# Patient Record
Sex: Male | Born: 1985 | Race: White | Hispanic: No | Marital: Single | Smoking: Never smoker
Health system: Southern US, Community
[De-identification: ages and names within clinical notes are randomized; demographics above are authoritative.]

## PROBLEM LIST (undated history)

## (undated) ENCOUNTER — Emergency Department (HOSPITAL_COMMUNITY): Admission: EM | Payer: BLUE CROSS/BLUE SHIELD | Source: Home / Self Care

## (undated) DIAGNOSIS — K5792 Diverticulitis of intestine, part unspecified, without perforation or abscess without bleeding: Secondary | ICD-10-CM

## (undated) DIAGNOSIS — E119 Type 2 diabetes mellitus without complications: Secondary | ICD-10-CM

## (undated) DIAGNOSIS — T7840XA Allergy, unspecified, initial encounter: Secondary | ICD-10-CM

## (undated) DIAGNOSIS — K760 Fatty (change of) liver, not elsewhere classified: Secondary | ICD-10-CM

## (undated) DIAGNOSIS — K5790 Diverticulosis of intestine, part unspecified, without perforation or abscess without bleeding: Secondary | ICD-10-CM

## (undated) DIAGNOSIS — Q433 Congenital malformations of intestinal fixation: Secondary | ICD-10-CM

## (undated) DIAGNOSIS — J45909 Unspecified asthma, uncomplicated: Secondary | ICD-10-CM

## (undated) DIAGNOSIS — K859 Acute pancreatitis without necrosis or infection, unspecified: Secondary | ICD-10-CM

## (undated) HISTORY — PX: OTHER SURGICAL HISTORY: SHX169

## (undated) HISTORY — DX: Allergy, unspecified, initial encounter: T78.40XA

## (undated) HISTORY — PX: EYE SURGERY: SHX253

## (undated) HISTORY — DX: Diverticulitis of intestine, part unspecified, without perforation or abscess without bleeding: K57.92

## (undated) HISTORY — DX: Fatty (change of) liver, not elsewhere classified: K76.0

## (undated) HISTORY — DX: Diverticulosis of intestine, part unspecified, without perforation or abscess without bleeding: K57.90

## (undated) HISTORY — DX: Congenital malformations of intestinal fixation: Q43.3

## (undated) HISTORY — DX: Unspecified asthma, uncomplicated: J45.909

## (undated) HISTORY — PX: APPENDECTOMY: SHX54

---

## 2011-10-05 ENCOUNTER — Emergency Department (HOSPITAL_COMMUNITY)
Admission: EM | Admit: 2011-10-05 | Discharge: 2011-10-05 | Disposition: A | Discharge status: Home / Self Care | Payer: Self-pay | Attending: Emergency Medicine | Admitting: Emergency Medicine

## 2011-10-05 ENCOUNTER — Emergency Department (HOSPITAL_COMMUNITY): Payer: Self-pay

## 2011-10-05 ENCOUNTER — Encounter (HOSPITAL_COMMUNITY): Payer: Self-pay | Admitting: *Deleted

## 2011-10-05 DIAGNOSIS — M7989 Other specified soft tissue disorders: Secondary | ICD-10-CM | POA: Insufficient documentation

## 2011-10-05 DIAGNOSIS — S93402A Sprain of unspecified ligament of left ankle, initial encounter: Secondary | ICD-10-CM

## 2011-10-05 DIAGNOSIS — Y92009 Unspecified place in unspecified non-institutional (private) residence as the place of occurrence of the external cause: Secondary | ICD-10-CM | POA: Insufficient documentation

## 2011-10-05 DIAGNOSIS — S93409A Sprain of unspecified ligament of unspecified ankle, initial encounter: Secondary | ICD-10-CM | POA: Insufficient documentation

## 2011-10-05 DIAGNOSIS — W19XXXA Unspecified fall, initial encounter: Secondary | ICD-10-CM | POA: Insufficient documentation

## 2011-10-05 IMAGING — CR DG ANKLE COMPLETE 3+V*L*
3 series · 3 of 3 positions shown · non-contrast
Comparison: None.

CLINICAL DATA: Twisting left ankle injury.  Fall.  Lateral pain.

LEFT ANKLE COMPLETE - 3+ VIEW

[view not recorded (1 of 3)]
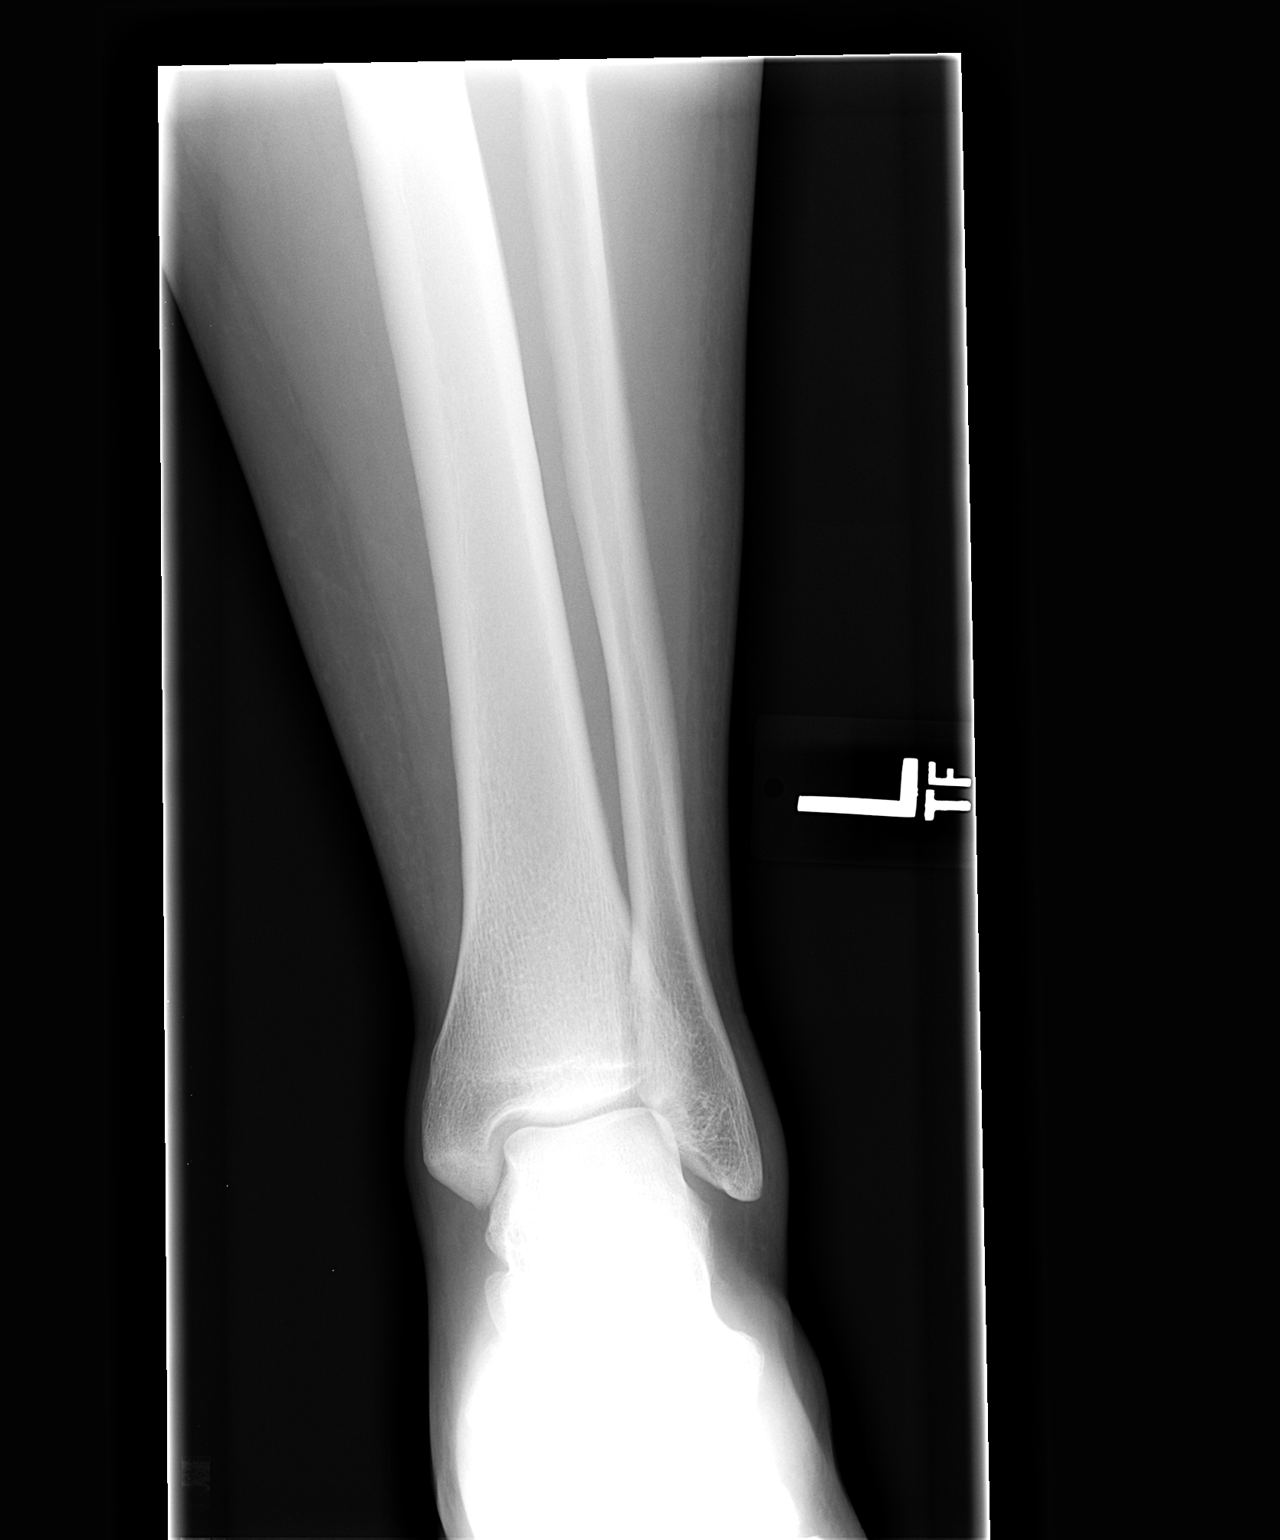

[view not recorded (2 of 3)]
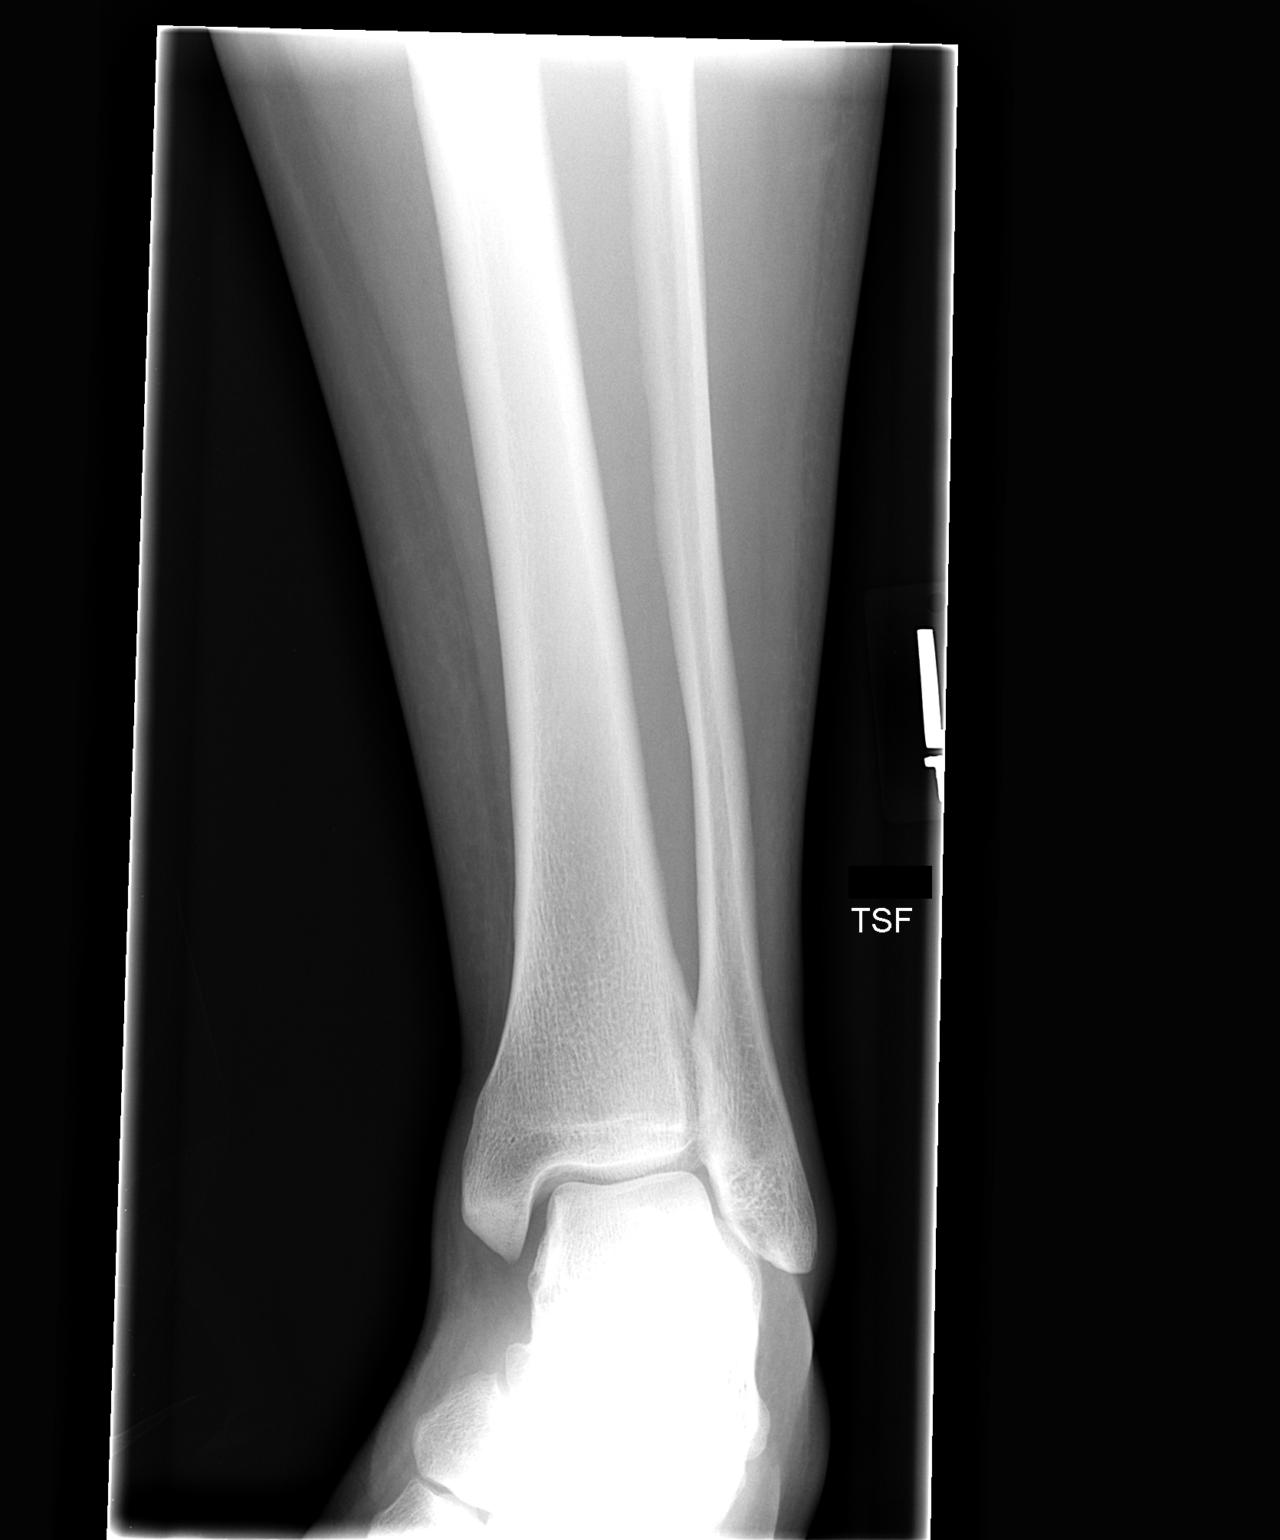

[view not recorded (3 of 3)]
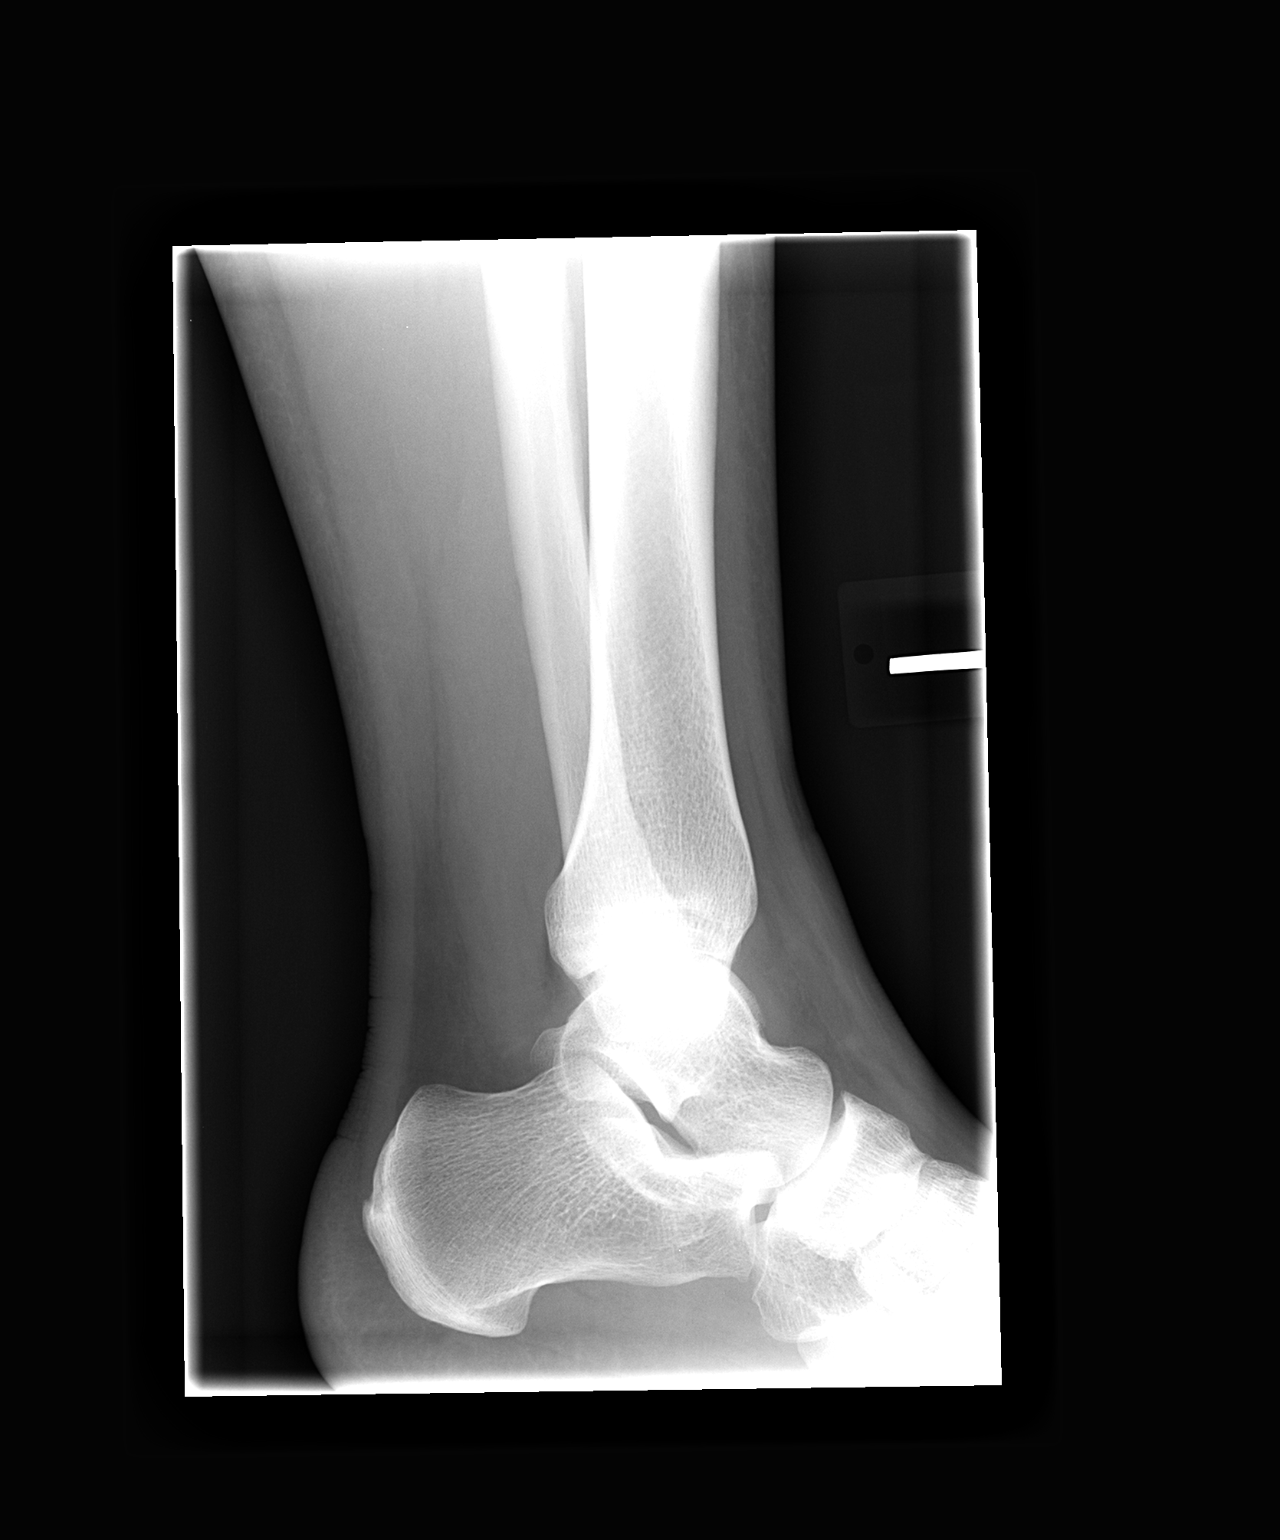

[3 of 3 positions shown; findings below may reference images not displayed]

FINDINGS: Mild soft tissue swelling overlies the lateral malleolus.
No malleolar fracture noted.  The plafond and talar dome appear
intact.
IMPRESSION: 1.  Mild lateral soft tissue swelling without observe fracture.  If
there is a high clinical index of occult injury, CT or MRI may be
helpful.

## 2011-10-05 MED ORDER — HYDROCODONE-ACETAMINOPHEN 5-325 MG PO TABS
2.0000 | ORAL_TABLET | Freq: Once | ORAL | Status: AC
  Administered 2011-10-05: 2 via ORAL
  Filled 2011-10-05: qty 2

## 2011-10-05 MED ORDER — PROMETHAZINE HCL 12.5 MG PO TABS
12.5000 mg | ORAL_TABLET | Freq: Once | ORAL | Status: AC
  Administered 2011-10-05: 12.5 mg via ORAL
  Filled 2011-10-05: qty 1

## 2011-10-05 MED ORDER — IBUPROFEN 800 MG PO TABS
800.0000 mg | ORAL_TABLET | Freq: Once | ORAL | Status: AC
  Administered 2011-10-05: 800 mg via ORAL
  Filled 2011-10-05: qty 1

## 2011-10-05 MED ORDER — HYDROCODONE-ACETAMINOPHEN 7.5-325 MG PO TABS: 1.0000 | ORAL_TABLET | ORAL | Status: AC | PRN

## 2011-10-05 MED ORDER — IBUPROFEN 800 MG PO TABS: 800.0000 mg | ORAL_TABLET | Freq: Three times a day (TID) | ORAL | Status: AC

## 2011-10-05 NOTE — ED Provider Notes (Signed)
History     CSN: [id number]  Arrival date & time 10/05/11  1956   None     Chief Complaint  Patient presents with  . Ankle Injury    (Consider location/radiation/quality/duration/timing/severity/associated sxs/prior treatment) Patient is a 26 y.o. male presenting with lower extremity injury. The history is provided by the patient.  Ankle Injury This is a new problem. The current episode started today. The problem occurs constantly. The problem has been gradually worsening. Associated symptoms include joint swelling. Pertinent negatives include no abdominal pain, arthralgias, chest pain, coughing or neck pain. The symptoms are aggravated by standing. He has tried nothing for the symptoms. The treatment provided no relief.    History reviewed. No pertinent past medical history.  Past Surgical History  Procedure Date  . Cranial surgery     No family history on file.  History  Substance Use Topics  . Smoking status: Unknown If Ever Smoked  . Smokeless tobacco: Not on file  . Alcohol Use: Yes     occassional      Review of Systems  Constitutional: Negative for activity change.       All ROS Neg except as noted in HPI  HENT: Negative for nosebleeds and neck pain.   Eyes: Negative for photophobia and discharge.  Respiratory: Negative for cough, shortness of breath and wheezing.   Cardiovascular: Negative for chest pain and palpitations.  Gastrointestinal: Negative for abdominal pain and blood in stool.  Genitourinary: Negative for dysuria, frequency and hematuria.  Musculoskeletal: Positive for joint swelling. Negative for back pain and arthralgias.  Skin: Negative.   Neurological: Negative for dizziness, seizures and speech difficulty.  Psychiatric/Behavioral: Negative for hallucinations and confusion.    Allergies  Poison sumac extract  Home Medications   Current Outpatient Rx  Name Route Sig Dispense Refill  . IBUPROFEN 200 MG PO TABS Oral Take 400 mg by mouth  as needed. For headache pain      BP 132/71  Pulse 91  Temp(Src) 97.9 F (36.6 C) (Oral)  Resp 20  Ht 5\' 11"  (1.803 m)  Wt 235 lb (106.595 kg)  BMI 32.78 kg/m2  SpO2 97%  Physical Exam  Nursing note and vitals reviewed. Constitutional: He is oriented to person, place, and time. He appears well-developed and well-nourished.  Non-toxic appearance.  HENT:  Head: Normocephalic.  Right Ear: Tympanic membrane and external ear normal.  Left Ear: Tympanic membrane and external ear normal.  Eyes: EOM and lids are normal. Pupils are equal, round, and reactive to light.  Neck: Normal range of motion. Neck supple. Carotid bruit is not present.  Cardiovascular: Normal rate, regular rhythm, normal heart sounds, intact distal pulses and normal pulses.   Pulmonary/Chest: Breath sounds normal. No respiratory distress.  Abdominal: Soft. Bowel sounds are normal. There is no tenderness. There is no guarding.  Musculoskeletal: Normal range of motion.       There is good range of motion of the left hip and knee. There is soreness of the lower left tibial area. There is swelling and pain of the left lateral malleolus. The Achilles tendon is intact. There is good capillary refill. Good dorsalis pedis pulses are symmetrical.  Lymphadenopathy:       Head (right side): No submandibular adenopathy present.       Head (left side): No submandibular adenopathy present.    He has no cervical adenopathy.  Neurological: He is alert and oriented to person, place, and time. He has normal strength. No cranial nerve  deficit or sensory deficit.  Skin: Skin is warm and dry.  Psychiatric: He has a normal mood and affect. His speech is normal.    ED Course  Procedures (including critical care time) Pulse oximetry 97% on room air. Within normal limits by my interpretation. Labs Reviewed - No data to display Dg Ankle Complete Left  10/05/2011  *RADIOLOGY REPORT*  Clinical Data: Twisting left ankle injury.  Fall.   Lateral pain.  LEFT ANKLE COMPLETE - 3+ VIEW  Comparison: None.  Findings: Mild soft tissue swelling overlies the lateral malleolus. No malleolar fracture noted.  The plafond and talar dome appear intact.  IMPRESSION:  1.  Mild lateral soft tissue swelling without observe fracture.  If there is a high clinical index of occult injury, CT or MRI may be helpful.  Original Report Authenticated By: Dellia Cloud, M.D.     No diagnosis found.    MDM  I have reviewed nursing notes, vital signs, and all appropriate lab and imaging results for this patient. Patient sustained a fall outside of his home today. States he thought he heard something pop. The x-ray of the left ankle is negative for fracture or dislocation. There is some soft tissue swelling of the lateral malleolus. The patient is advised to use an ice pack and elevation over the next few days. He is fitted with an ankle splint. He is fitted with crutches, and advised to use the crutches until he can safely bear weight on the left ankle. Prescription for ibuprofen 800 mg one 3 times daily with food, and Norco 7.5 mg one every 4 hours as needed for pain given to the patient. The patient is to see orthopedics if not improving.       Kathie Dike, [place] 10/05/11 2125

## 2011-10-05 NOTE — Discharge Instructions (Signed)
Your x-rays are negative for fracture or dislocation at this time. Please apply ice, and keep the left ankle elevated above her waist. Please use crutches when up and about, and use crutches until you can safely apply weight to the left ankle. Please use the ankle splint for the next 10-14 days, when up and about. Please do not use the splint when sleeping. Please see the orthopedic MD listed above or the orthopedic MD of your choice if not improving.Ankle Sprain An ankle sprain is an injury to the strong, fibrous tissues (ligaments) that hold the bones of your ankle joint together.  CAUSES Ankle sprain usually is caused by a fall or by twisting your ankle. People who participate in sports are more prone to these types of injuries.  SYMPTOMS  Symptoms of ankle sprain include:  Pain in your ankle. The pain may be present at rest or only when you are trying to stand or walk.   Swelling.   Bruising. Bruising may develop immediately or within 1 to 2 days after your injury.   Difficulty standing or walking.  DIAGNOSIS  Your caregiver will ask you details about your injury and perform a physical exam of your ankle to determine if you have an ankle sprain. During the physical exam, your caregiver will press and squeeze specific areas of your foot and ankle. Your caregiver will try to move your ankle in certain ways. An X-ray exam may be done to be sure a bone was not broken or a ligament did not separate from one of the bones in your ankle (avulsion).  TREATMENT  Certain types of braces can help stabilize your ankle. Your caregiver can make a recommendation for this. Your caregiver may recommend the use of medication for pain. If your sprain is severe, your caregiver may refer you to a surgeon who helps to restore function to parts of your skeletal system (orthopedist) or a physical therapist. HOME CARE INSTRUCTIONS  Apply ice to your injury for 1 to 2 days or as directed by your caregiver. Applying ice  helps to reduce inflammation and pain.  Put ice in a plastic bag.   Place a towel between your skin and the bag.   Leave the ice on for 15 to 20 minutes at a time, every 2 hours while you are awake.   Take over-the-counter or prescription medicines for pain, discomfort, or fever only as directed by your caregiver.   Keep your injured leg elevated, when possible, to lessen swelling.   If your caregiver recommends crutches, use them as instructed. Gradually, put weight on the affected ankle. Continue to use crutches or a cane until you can walk without feeling pain in your ankle.   If you have a plaster splint, wear the splint as directed by your caregiver. Do not rest it on anything harder than a pillow the first 24 hours. Do not put weight on it. Do not get it wet. You may take it off to take a shower or bath.   You may have been given an elastic bandage to wear around your ankle to provide support. If the elastic bandage is too tight (you have numbness or tingling in your foot or your foot becomes cold and blue), adjust the bandage to make it comfortable.   If you have an air splint, you may blow more air into it or let air out to make it more comfortable. You may take your splint off at night and before taking a shower  or bath.   Wiggle your toes in the splint several times per day if you are able.  SEEK MEDICAL CARE IF:   You have an increase in bruising, swelling, or pain.   Your toes feel cold.   Pain relief is not achieved with medication.  SEEK IMMEDIATE MEDICAL CARE IF: Your toes are numb or blue or you have severe pain. MAKE SURE YOU:   Understand these instructions.   Will watch your condition.   Will get help right away if you are not doing well or get worse.  Document Released: 07/06/2005 Document Revised: 06/25/2011 Document Reviewed: 02/08/2008 [facility] Patient Information 2012 [place], [place].

## 2011-10-05 NOTE — ED Notes (Signed)
Pain in left ankle, fell felt something pop

## 2011-10-05 NOTE — ED Notes (Addendum)
Pain lt ankle, Fell app 730p on wet concrete.  Swelling of lat malleolus.  Good DP pulse.  Pt does not want to place on pillow, says that it makes it hurt worse.  Ice pack applied.

## 2011-10-09 NOTE — ED Provider Notes (Signed)
Medical screening examination/treatment/procedure(s) were performed by non-physician practitioner and as supervising physician I was immediately available for consultation/collaboration.   Shelda Jakes, MD 10/09/11 [phone]

## 2013-08-03 ENCOUNTER — Emergency Department (HOSPITAL_COMMUNITY): Payer: BC Managed Care – PPO

## 2013-08-03 ENCOUNTER — Ambulatory Visit (INDEPENDENT_AMBULATORY_CARE_PROVIDER_SITE_OTHER): Payer: BC Managed Care – PPO | Admitting: Family Medicine

## 2013-08-03 ENCOUNTER — Encounter: Payer: Self-pay | Admitting: Family Medicine

## 2013-08-03 ENCOUNTER — Encounter (HOSPITAL_COMMUNITY): Payer: Self-pay | Admitting: Emergency Medicine

## 2013-08-03 ENCOUNTER — Encounter (INDEPENDENT_AMBULATORY_CARE_PROVIDER_SITE_OTHER): Payer: Self-pay

## 2013-08-03 ENCOUNTER — Inpatient Hospital Stay (HOSPITAL_COMMUNITY)
Admission: EM | Admit: 2013-08-03 | Discharge: 2013-08-05 | DRG: 392 | Disposition: A | Discharge status: Home / Self Care | Payer: BC Managed Care – PPO | Attending: Internal Medicine | Admitting: Internal Medicine

## 2013-08-03 VITALS — BP 156/101 | HR 132 | Temp 98.8°F | Ht 71.0 in | Wt 274.0 lb

## 2013-08-03 DIAGNOSIS — E669 Obesity, unspecified: Secondary | ICD-10-CM | POA: Diagnosis present

## 2013-08-03 DIAGNOSIS — R109 Unspecified abdominal pain: Secondary | ICD-10-CM

## 2013-08-03 DIAGNOSIS — R1032 Left lower quadrant pain: Secondary | ICD-10-CM | POA: Diagnosis present

## 2013-08-03 DIAGNOSIS — R52 Pain, unspecified: Secondary | ICD-10-CM

## 2013-08-03 DIAGNOSIS — I498 Other specified cardiac arrhythmias: Secondary | ICD-10-CM | POA: Diagnosis present

## 2013-08-03 DIAGNOSIS — Z6838 Body mass index (BMI) 38.0-38.9, adult: Secondary | ICD-10-CM

## 2013-08-03 DIAGNOSIS — Q433 Congenital malformations of intestinal fixation: Secondary | ICD-10-CM

## 2013-08-03 DIAGNOSIS — K5712 Diverticulitis of small intestine without perforation or abscess without bleeding: Principal | ICD-10-CM | POA: Diagnosis present

## 2013-08-03 DIAGNOSIS — K5732 Diverticulitis of large intestine without perforation or abscess without bleeding: Secondary | ICD-10-CM | POA: Diagnosis present

## 2013-08-03 DIAGNOSIS — R112 Nausea with vomiting, unspecified: Secondary | ICD-10-CM | POA: Diagnosis present

## 2013-08-03 DIAGNOSIS — K5792 Diverticulitis of intestine, part unspecified, without perforation or abscess without bleeding: Secondary | ICD-10-CM | POA: Diagnosis present

## 2013-08-03 DIAGNOSIS — R6510 Systemic inflammatory response syndrome (SIRS) of non-infectious origin without acute organ dysfunction: Secondary | ICD-10-CM

## 2013-08-03 DIAGNOSIS — R000 Tachycardia, unspecified: Secondary | ICD-10-CM | POA: Diagnosis present

## 2013-08-03 LAB — COMPREHENSIVE METABOLIC PANEL
ALBUMIN: 3.9 g/dL (ref 3.5–5.2)
ALK PHOS: 86 U/L (ref 39–117)
ALT: 33 U/L (ref 0–53)
AST: 24 U/L (ref 0–37)
BUN: 14 mg/dL (ref 6–23)
CALCIUM: 9.5 mg/dL (ref 8.4–10.5)
CO2: 24 mEq/L (ref 19–32)
Chloride: 100 mEq/L (ref 96–112)
Creatinine, Ser: 0.81 mg/dL (ref 0.50–1.35)
GFR calc non Af Amer: >90 mL/min (ref >90)
GLUCOSE: 94 mg/dL (ref 70–99)
POTASSIUM: 4 meq/L (ref 3.7–5.3)
SODIUM: 138 meq/L (ref 137–147)
TOTAL PROTEIN: 8.1 g/dL (ref 6.0–8.3)
Total Bilirubin: 0.4 mg/dL (ref 0.3–1.2)

## 2013-08-03 LAB — URINALYSIS, ROUTINE W REFLEX MICROSCOPIC
BILIRUBIN URINE: NEGATIVE
Bilirubin Urine: NEGATIVE
GLUCOSE, UA: NEGATIVE mg/dL
GLUCOSE, UA: NEGATIVE mg/dL
KETONES UR: NEGATIVE mg/dL
Ketones, ur: 15 mg/dL — AB
Leukocytes, UA: NEGATIVE
Leukocytes, UA: NEGATIVE
Nitrite: NEGATIVE
Nitrite: NEGATIVE
PH: 5.5 (ref 5.0–8.0)
Protein, ur: NEGATIVE mg/dL
Protein, ur: NEGATIVE mg/dL
SPECIFIC GRAVITY, URINE: 1.025 (ref 1.005–1.030)
Specific Gravity, Urine: 1.02 (ref 1.005–1.030)
Urobilinogen, UA: 0.2 mg/dL (ref 0.0–1.0)
Urobilinogen, UA: 0.2 mg/dL (ref 0.0–1.0)
pH: 5 (ref 5.0–8.0)

## 2013-08-03 LAB — CBC WITH DIFFERENTIAL/PLATELET
Basophils Absolute: 0 10*3/uL (ref 0.0–0.1)
Basophils Relative: 0 % (ref 0–1)
EOS PCT: 1 % (ref 0–5)
Eosinophils Absolute: 0.1 10*3/uL (ref 0.0–0.7)
HEMATOCRIT: 44.6 % (ref 39.0–52.0)
HEMOGLOBIN: 15.8 g/dL (ref 13.0–17.0)
LYMPHS PCT: 14 % (ref 12–46)
Lymphs Abs: 2.1 10*3/uL (ref 0.7–4.0)
MCH: 31.7 pg (ref 26.0–34.0)
MCHC: 35.4 g/dL (ref 30.0–36.0)
MCV: 89.6 fL (ref 78.0–100.0)
MONO ABS: 2.3 10*3/uL — AB (ref 0.1–1.0)
MONOS PCT: 15 % — AB (ref 3–12)
NEUTROS ABS: 10.4 10*3/uL — AB (ref 1.7–7.7)
Neutrophils Relative %: 70 % (ref 43–77)
Platelets: 253 10*3/uL (ref 150–400)
RBC: 4.98 MIL/uL (ref 4.22–5.81)
RDW: 12.9 % (ref 11.5–15.5)
WBC: 14.9 10*3/uL — AB (ref 4.0–10.5)

## 2013-08-03 LAB — URINE MICROSCOPIC-ADD ON

## 2013-08-03 LAB — LIPASE, BLOOD: Lipase: 33 U/L (ref 11–59)

## 2013-08-03 LAB — LACTIC ACID, PLASMA: Lactic Acid, Venous: 1.1 mmol/L (ref 0.5–2.2)

## 2013-08-03 IMAGING — CT CT ABD-PELV W/ CM
2 of 4 series · 15 of 46 positions shown, 17 images · IV contrast (APPLIED)
Comparison: None.

CLINICAL DATA: Left lower quadrant pain with nausea. Abdominal
distention.

EXAM:
CT ABDOMEN AND PELVIS WITH CONTRAST
TECHNIQUE: Multidetector CT imaging of the abdomen and pelvis was performed
using the standard protocol following bolus administration of
intravenous contrast.
CONTRAST:  100mL OMNIPAQUE IOHEXOL 300 MG/ML  SOLN

[Series 2: abd/ pelvis 5.0 i30f 1 · axial · 0.87mm/px · z∈[-798,-288]mm · 12 of 112 slices shown, 14 images]
[im 5/112  soft-tissue]
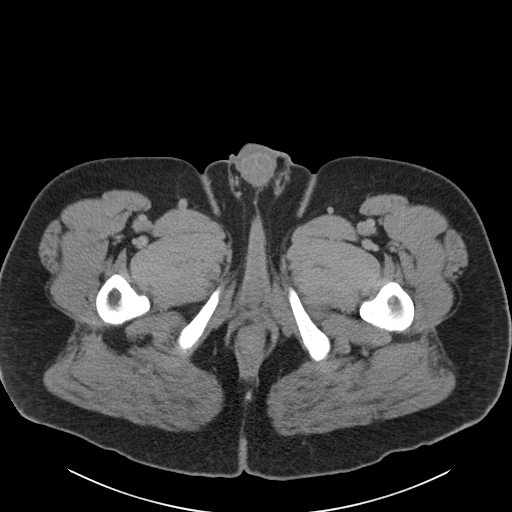
[im 5/112  bone]
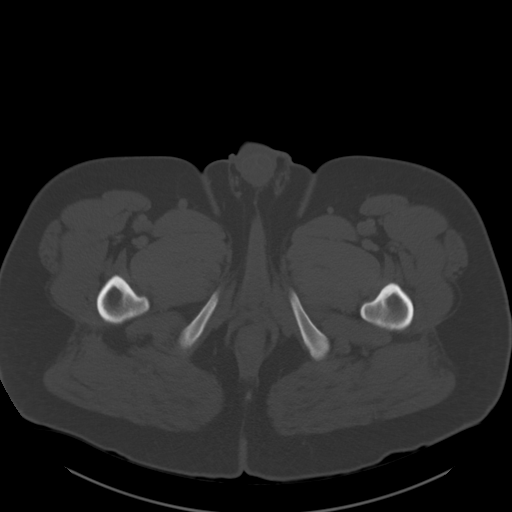
[im 14/112  soft-tissue]
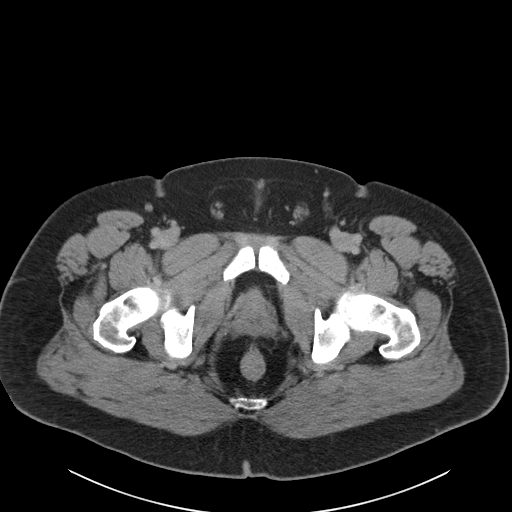
[im 24/112  soft-tissue]
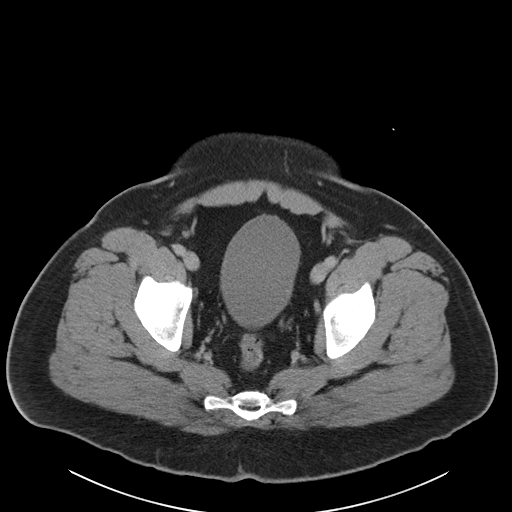
[im 33/112  soft-tissue]
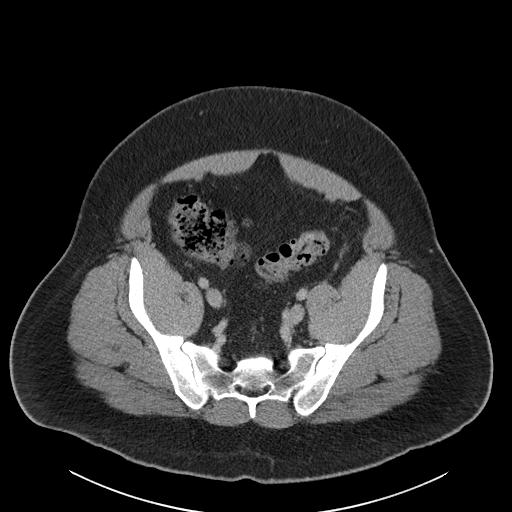
[im 42/112  soft-tissue]
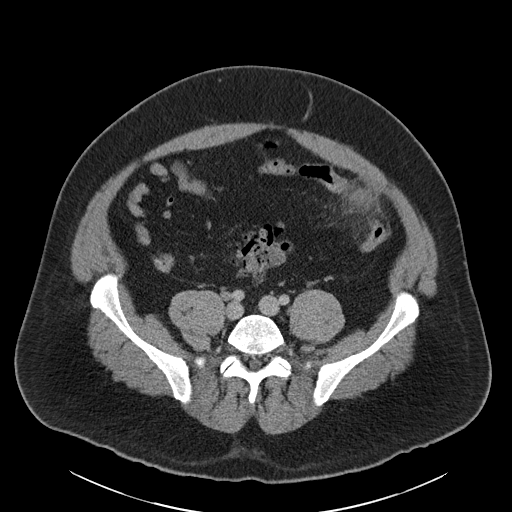
[im 51/112  soft-tissue]
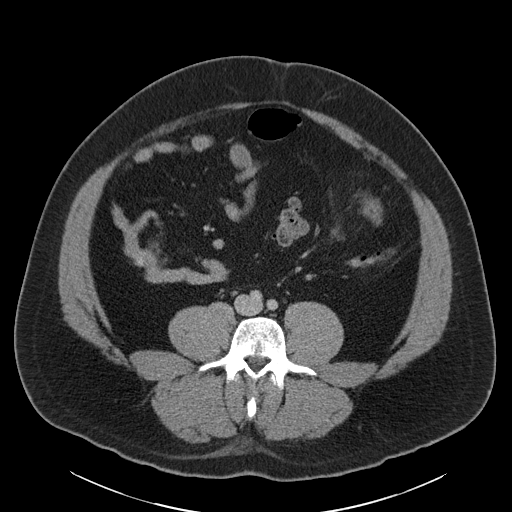
[im 61/112  soft-tissue]
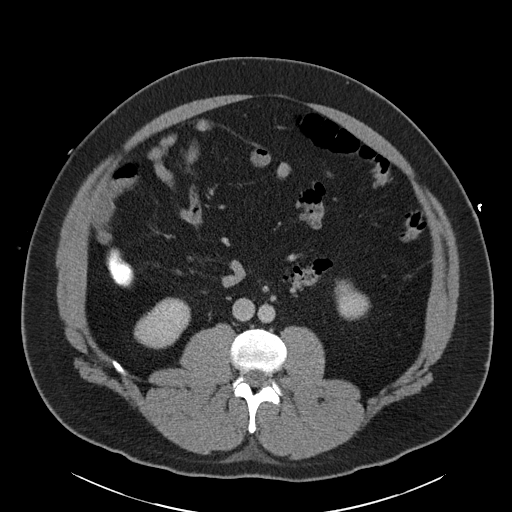
[im 70/112  soft-tissue]
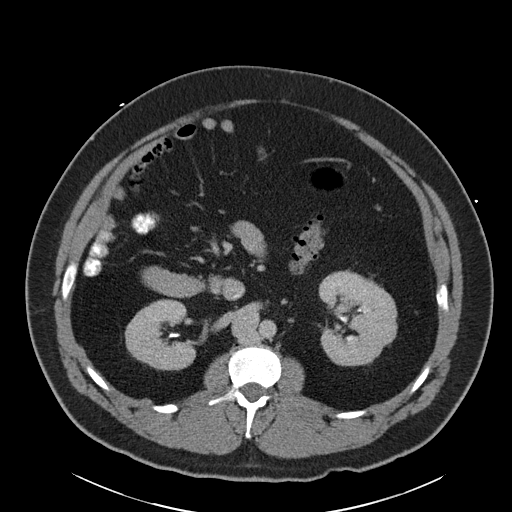
[im 79/112  soft-tissue]
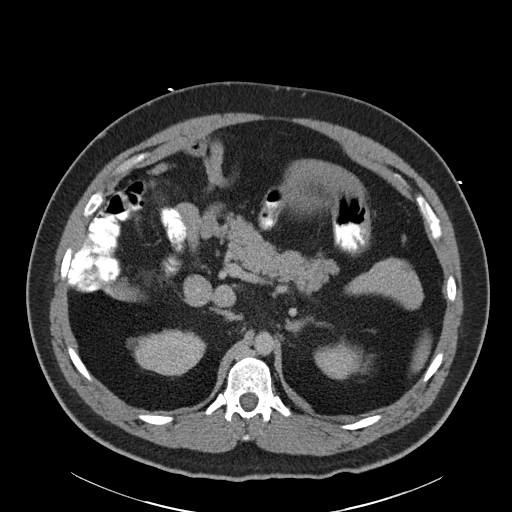
[im 79/112  bone]
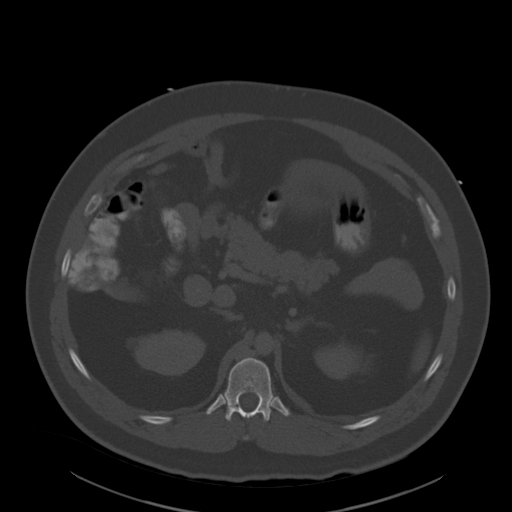
[im 88/112  soft-tissue]
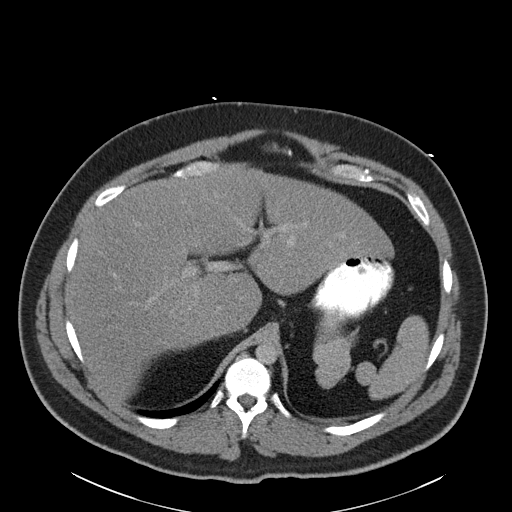
[im 98/112  soft-tissue]
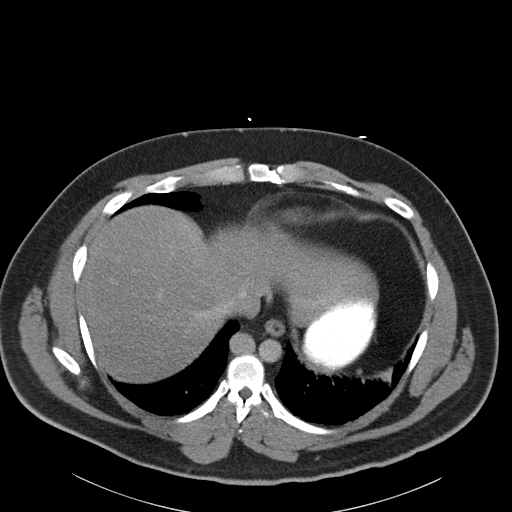
[im 107/112  soft-tissue]
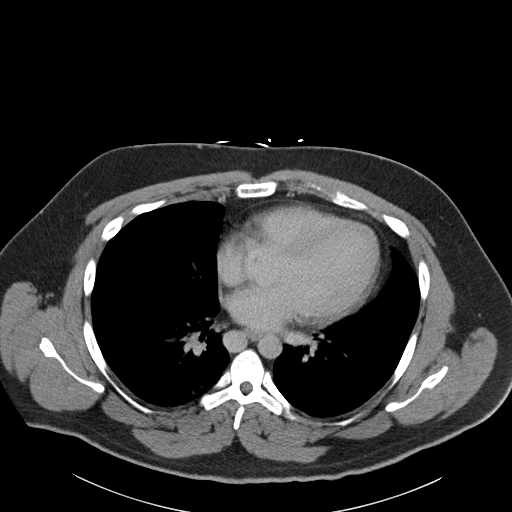

[Series 5: cor · coronal · 0.81mm/px · 3 of 147 slices shown]
[im 49/147  soft-tissue]
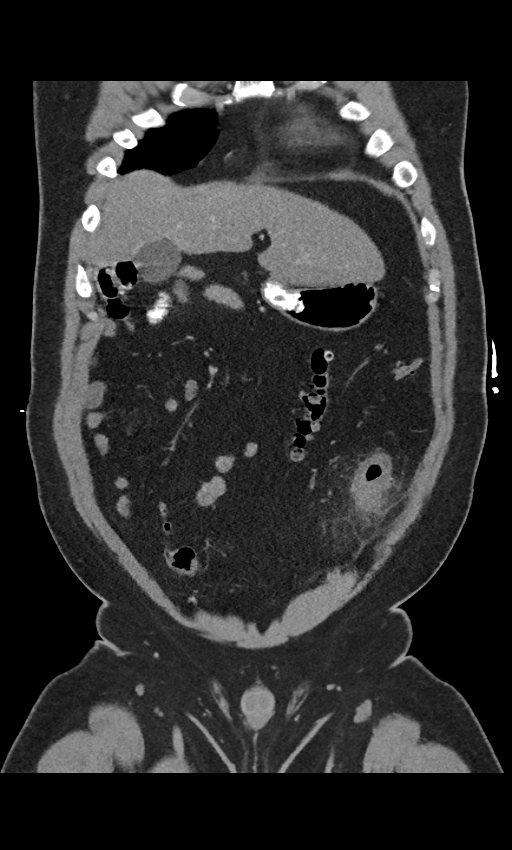
[im 65/147  soft-tissue]
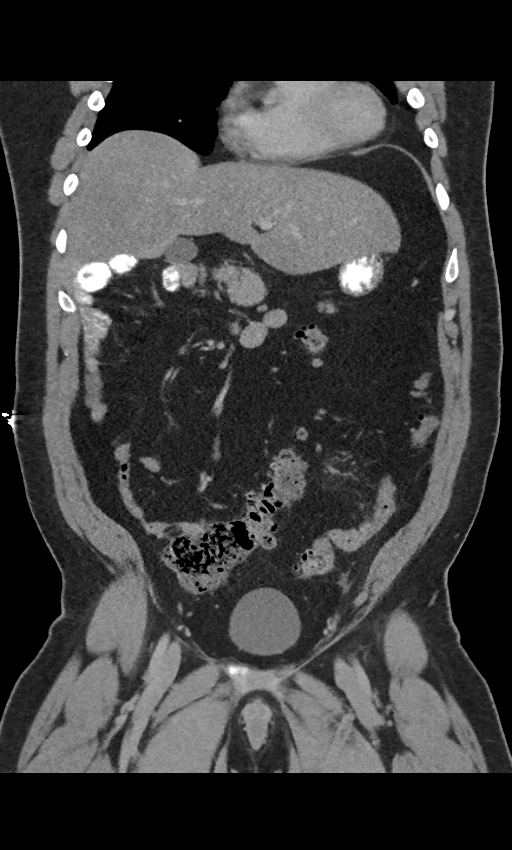
[im 82/147  soft-tissue]
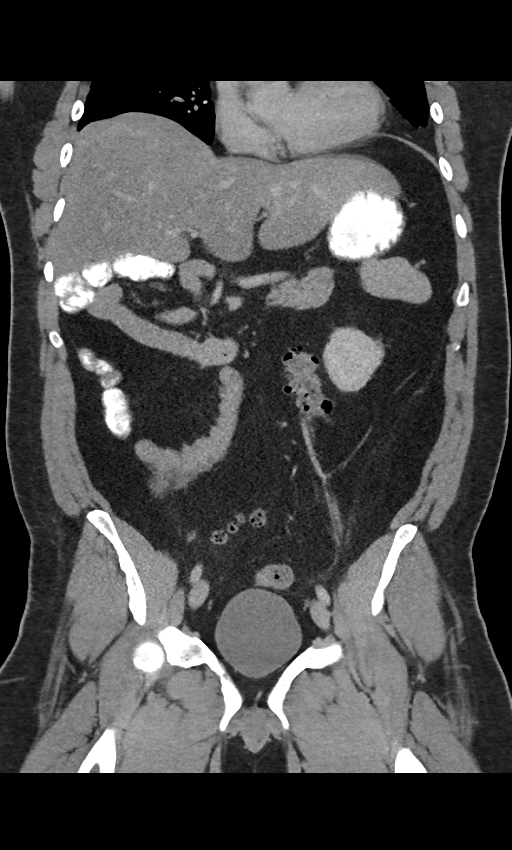

[15 of 46 positions shown; findings below may reference images not displayed]

FINDINGS: Lower Chest: Atelectasis at the lung bases. Normal heart size
without pericardial or pleural effusion.

Abdomen/Pelvis: Moderate hepatic steatosis, without focal liver
lesion. Left upper quadrant polysplenia. Azygos continuation of the
IVC, anatomic variant.

Normal stomach, pancreas, gallbladder, biliary tract, adrenal
glands. Interpolar right renal cyst. 9 mm. Normal left kidney.

No retroperitoneal or retrocrural adenopathy. Small bowel
malrotation, with small bowel positioned in the right side of the
abdomen and the colon positioned primarily within the left side of
the abdomen.

Extensive colonic diverticulosis. Wall thickening of and edema
surrounding the sigmoid, including on image 71/series 2. No
surrounding abscess or free perforation.

The ileocecal junction and cecum are positioned within the right
paracentral pelvis. Normal appendix. Small bowel is not dilated.

No pelvic adenopathy. Normal urinary bladder and prostate. No
significant free fluid.

Bones/Musculoskeletal:  No acute osseous abnormality.
IMPRESSION: 1. Uncomplicated sigmoid diverticulitis.
2. Azygos continuation of the IVC with polysplenia.
3. Small bowel malrotation, without acute complication.

## 2013-08-03 MED ORDER — METRONIDAZOLE IN NACL 5-0.79 MG/ML-% IV SOLN
500.0000 mg | Freq: Once | INTRAVENOUS | Status: AC
  Administered 2013-08-03: 500 mg via INTRAVENOUS
  Filled 2013-08-03: qty 100

## 2013-08-03 MED ORDER — SODIUM CHLORIDE 0.9 % IJ SOLN: 3.0000 mL | Freq: Two times a day (BID) | INTRAMUSCULAR | Status: DC

## 2013-08-03 MED ORDER — ACETAMINOPHEN 325 MG PO TABS
650.0000 mg | ORAL_TABLET | Freq: Once | ORAL | Status: AC
  Administered 2013-08-03: 650 mg via ORAL
  Filled 2013-08-03: qty 2

## 2013-08-03 MED ORDER — SODIUM CHLORIDE 0.9 % IV BOLUS (SEPSIS)
1000.0000 mL | Freq: Once | INTRAVENOUS | Status: AC
  Administered 2013-08-03: 1000 mL via INTRAVENOUS

## 2013-08-03 MED ORDER — IOHEXOL 300 MG/ML  SOLN
100.0000 mL | Freq: Once | INTRAMUSCULAR | Status: AC | PRN
  Administered 2013-08-03: 100 mL via INTRAVENOUS

## 2013-08-03 MED ORDER — ONDANSETRON HCL 4 MG/2ML IJ SOLN
4.0000 mg | Freq: Once | INTRAMUSCULAR | Status: AC
  Administered 2013-08-03: 4 mg via INTRAVENOUS
  Filled 2013-08-03: qty 2

## 2013-08-03 MED ORDER — CIPROFLOXACIN IN D5W 400 MG/200ML IV SOLN
400.0000 mg | Freq: Once | INTRAVENOUS | Status: AC
  Administered 2013-08-03: 400 mg via INTRAVENOUS
  Filled 2013-08-03: qty 200

## 2013-08-03 MED ORDER — SIMETHICONE 80 MG PO CHEW
160.0000 mg | CHEWABLE_TABLET | Freq: Four times a day (QID) | ORAL | Status: DC | PRN
  Administered 2013-08-03 – 2013-08-05 (×3): 160 mg via ORAL
  Filled 2013-08-03 (×3): qty 2

## 2013-08-03 MED ORDER — IOHEXOL 300 MG/ML  SOLN
25.0000 mL | INTRAMUSCULAR | Status: AC
  Administered 2013-08-03: 25 mL via ORAL

## 2013-08-03 MED ORDER — HYDROMORPHONE HCL PF 1 MG/ML IJ SOLN
1.0000 mg | INTRAMUSCULAR | Status: DC | PRN
  Administered 2013-08-03 – 2013-08-04 (×4): 1 mg via INTRAVENOUS
  Filled 2013-08-03 (×4): qty 1

## 2013-08-03 MED ORDER — HYDROMORPHONE HCL PF 1 MG/ML IJ SOLN
0.5000 mg | Freq: Once | INTRAMUSCULAR | Status: AC
  Administered 2013-08-03: 0.5 mg via INTRAVENOUS
  Filled 2013-08-03: qty 1

## 2013-08-03 MED ORDER — ONDANSETRON HCL 4 MG/2ML IJ SOLN
4.0000 mg | Freq: Four times a day (QID) | INTRAMUSCULAR | Status: DC | PRN
  Administered 2013-08-04: 4 mg via INTRAVENOUS
  Filled 2013-08-03: qty 2

## 2013-08-03 MED ORDER — ONDANSETRON HCL 4 MG PO TABS: 4.0000 mg | ORAL_TABLET | Freq: Four times a day (QID) | ORAL | Status: DC | PRN

## 2013-08-03 MED ORDER — BIOTENE DRY MOUTH MT LIQD
15.0000 mL | Freq: Two times a day (BID) | OROMUCOSAL | Status: DC
  Administered 2013-08-04: 16:00:00 15 mL via OROMUCOSAL

## 2013-08-03 MED ORDER — CIPROFLOXACIN IN D5W 400 MG/200ML IV SOLN
400.0000 mg | Freq: Two times a day (BID) | INTRAVENOUS | Status: DC
  Administered 2013-08-04 – 2013-08-05 (×2): 400 mg via INTRAVENOUS
  Filled 2013-08-03 (×4): qty 200

## 2013-08-03 MED ORDER — OXYCODONE HCL 5 MG PO TABS
5.0000 mg | ORAL_TABLET | ORAL | Status: DC | PRN
  Administered 2013-08-04 (×2): 5 mg via ORAL
  Filled 2013-08-03 (×3): qty 1

## 2013-08-03 MED ORDER — METRONIDAZOLE IN NACL 5-0.79 MG/ML-% IV SOLN
500.0000 mg | Freq: Three times a day (TID) | INTRAVENOUS | Status: DC
  Administered 2013-08-04 – 2013-08-05 (×4): 500 mg via INTRAVENOUS
  Filled 2013-08-03 (×6): qty 100

## 2013-08-03 MED ORDER — SODIUM CHLORIDE 0.9 % IV SOLN: INTRAVENOUS | Status: AC

## 2013-08-03 MED ORDER — CHLORHEXIDINE GLUCONATE 0.12 % MT SOLN
15.0000 mL | Freq: Two times a day (BID) | OROMUCOSAL | Status: DC
  Filled 2013-08-03 (×6): qty 15

## 2013-08-03 MED ORDER — HYDROMORPHONE HCL PF 1 MG/ML IJ SOLN
1.0000 mg | Freq: Once | INTRAMUSCULAR | Status: AC
  Administered 2013-08-03: 1 mg via INTRAVENOUS
  Filled 2013-08-03: qty 1

## 2013-08-03 NOTE — ED Notes (Signed)
Pt reports onset of LLQ pain last night when standing up from chair. Went to pcp today and sent here for further eval. Reports nausea, gas and diarrhea. HR 121 at triage.

## 2013-08-03 NOTE — Progress Notes (Signed)
   Subjective:    Patient ID: Brandon Dominguez, male    DOB: 1985/10/25, 28 y.o.   MRN: [id number]  HPI  This 28 y.o. male presents for evaluation of acute abdominal pain.  He is c/o severe abdominal pain In LLQ of his abdomen since last night.  He states he has taken some MOM and this helped with a BM yesterday.  He describes his bm as initially hard then soft then diarrhea.  He has been belching but Not passing gas anymore.  He states he has an area in his LLQ of his abdomen the size of a silver dollar and it is very painful. He c/o being bloated.  He states he had chills last night.  He cannot lie flat due to the pain.  Review of Systems    No chest pain, SOB, HA, dizziness, vision change, N/V, diarrhea, constipation, dysuria, urinary urgency or frequency, myalgias, arthralgias or rash.  Objective:   Physical Exam Vital signs noted  Well developed well nourished male.  HEENT - Head atraumatic Normocephalic                Eyes - PERRLA, Conjuctiva - clear Sclera- Clear EOMI                Throat - oropharanx wnl Respiratory - Lungs CTA bilateral Cardiac - RRR S1 and S2 without murmur GI - Abdomen distended and tympanic in upper quadrants.  He is unable to be  In supine position due to pain so he stands for examination.  He has LLQ abdominal Pain with guarding.        Assessment & Plan:  Acute abdominal pain Explained to patient he should go to the ED for eval and tx of his acute abdominal pain due To severe pain and presentation.  Follow up after ED visit.  Deatra CanterWilliam J Oxford FNP

## 2013-08-03 NOTE — H&P (Signed)
PCP:   Rudi Heap, MD   Chief Complaint:  abd pain  HPI: 28 yo male healthy comes in with acute llq abd pain that started last night.  Was having chills today.  No fevers.  Some n/v nonbloody.  Last bm was yesterday afternoon.  Feels bloated.  Went to see pcp and was told to go to an ED for ct scan.  Has acute diverticulitis.  No history of bowel problems.    Review of Systems:  Positive and negative as per HPI otherwise all other systems are negative  Past Medical History: History reviewed. No pertinent past medical history. Past Surgical History  Procedure Laterality Date  . Cranial surgery      Medications: Prior to Admission medications   Medication Sig Start Date End Date Taking? Authorizing Provider  ibuprofen (ADVIL,MOTRIN) 200 MG tablet Take 800 mg by mouth every 8 (eight) hours as needed for moderate pain. For headache pain   Yes Historical Provider, MD  magnesium hydroxide (MILK OF MAGNESIA) 400 MG/5ML suspension Take 30 mLs by mouth daily as needed for mild constipation.   Yes Historical Provider, MD  simethicone (MYLICON) 80 MG chewable tablet Chew 160 mg by mouth every 6 (six) hours as needed for flatulence.   Yes Historical Provider, MD    Allergies:  No Known Allergies  Social History:  reports that he has never smoked. He has never used smokeless tobacco. He reports that he drinks alcohol. He reports that he does not use illicit drugs.  Family History: Family History  Problem Relation Age of Onset  . Diverticulitis Mother     Physical Exam: Filed Vitals:   08/03/13 1756 08/03/13 1830 08/03/13 1915 08/03/13 1948  BP: 138/85 142/80 124/77 127/79  Pulse: 120 123 114 116  Temp: 99.5 F (37.5 C)   98.7 F (37.1 C)  TempSrc: Oral   Oral  Resp: 18 17 19 18   SpO2: 96% 96% 95% 95%   General appearance: alert, cooperative and mild distress Head: Normocephalic, without obvious abnormality, atraumatic Eyes: negative Nose: Nares normal. Septum midline.  Mucosa normal. No drainage or sinus tenderness. Neck: no JVD and supple, symmetrical, trachea midline Lungs: clear to auscultation bilaterally Heart: regular rate and rhythm, S1, S2 normal, no murmur, click, rub or gallop Abdomen: mod distention, decreased bs, llq abd pain no rebound some mild guarding.   Extremities: extremities normal, atraumatic, no cyanosis or edema Pulses: 2+ and symmetric Skin: Skin color, texture, turgor normal. No rashes or lesions Neurologic: Grossly normal    Labs on Admission:   Recent Labs  08/03/13 1322  NA 138  K 4.0  CL 100  CO2 24  GLUCOSE 94  BUN 14  CREATININE 0.81  CALCIUM 9.5    Recent Labs  08/03/13 1322  AST 24  ALT 33  ALKPHOS 86  BILITOT 0.4  PROT 8.1  ALBUMIN 3.9    Recent Labs  08/03/13 1322  LIPASE 33    Recent Labs  08/03/13 1322  WBC 14.9*  NEUTROABS 10.4*  HGB 15.8  HCT 44.6  MCV 89.6  PLT 253    Radiological Exams on Admission: Ct Abdomen Pelvis W Contrast  08/03/2013   CLINICAL DATA:  Left lower quadrant pain with nausea. Abdominal distention.  EXAM: CT ABDOMEN AND PELVIS WITH CONTRAST  TECHNIQUE: Multidetector CT imaging of the abdomen and pelvis was performed using the standard protocol following bolus administration of intravenous contrast.  CONTRAST:  OMNIPAQUE IOHEXOL 300 MG/ML  SOLN  COMPARISON:  None.  FINDINGS: Lower Chest: Atelectasis at the lung bases. Normal heart size without pericardial or pleural effusion.  Abdomen/Pelvis: Moderate hepatic steatosis, without focal liver lesion. 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  Normal stomach, pancreas, gallbladder, biliary tract, adrenal glands. Interpolar right renal cyst. 9 mm. Normal left kidney.  No retroperitoneal or retrocrural adenopathy. Small bowel malrotation, with small bowel positioned in the right side of the abdomen and the colon positioned primarily within the left side of the abdomen.   Extensive colonic diverticulosis. Wall thickening of and edema surrounding the sigmoid, including on image 71/series 2. No surrounding abscess or free perforation.  The ileocecal junction and cecum are positioned within the right paracentral pelvis. Normal appendix. Small bowel is not dilated.  No pelvic adenopathy. Normal urinary bladder and prostate. No significant free fluid.  Bones/Musculoskeletal:  No acute osseous abnormality.  IMPRESSION: 1. Uncomplicated sigmoid diverticulitis. 2. 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   Electronically Signed   By: Jeronimo GreavesKyle  Talbot M.D.   On: 08/03/2013 17:54    Assessment/Plan  28 yo male with acute diverticulitis and incindental finding of malrotation of interstion  Principal Problem:   Acute diverticulitis of intestine  Ivf, iv flagyl and cipro.  Ck lactic acid level.  If no improvement overnight would consider getting general surgery involved.  nonacute abd at this time.  Liq diet only at this time.  Iv zofran and dilaudid prn.  Full code.  Active Problems:   Sinus tachycardia   Abdominal pain, acute, left lower quadrant   Nausea and vomiting in adult   Diverticulitis   Diverticulitis of colon   Malrotation of intestine    Everett Ehrler A 08/03/2013, 8:14 PM

## 2013-08-03 NOTE — ED Provider Notes (Signed)
CSN: [id number]     Arrival date & time 08/03/13  1228 History   First MD Initiated Contact with Patient 08/03/13 1539     Chief Complaint  Patient presents with  . Abdominal Pain    HPI: Mr. Glosser is a 28 yo M with history pertinent history who presents with abdominal pain which started last night after standing up from a chair. He describes the pain as sharp, non-radiating, worse with any movement, currently 10/10, associated with nausea and frequent belching. He has only eaten small amounts today. He does endorse mild, bilateral testicle pain, worse with urination. He has not had a BM since yesterday. He took 600 mg of Motrin yesterday without improvement of pain. He has not had pain similar to this in the past. No history of abdominal surgery. He was seen by his PCP today, they recommended ED evaluation.     History reviewed. No pertinent past medical history. Past Surgical History  Procedure Laterality Date  . Cranial surgery     Family History  Problem Relation Age of Onset  . Diverticulitis Mother    History  Substance Use Topics  . Smoking status: Never Smoker   . Smokeless tobacco: Never Used  . Alcohol Use: Yes     Comment: occassional    Review of Systems  Constitutional: Positive for appetite change (decreased today). Negative for fever, chills and fatigue.  Eyes: Negative for photophobia and visual disturbance.  Respiratory: Negative for cough and shortness of breath.   Cardiovascular: Negative for chest pain and leg swelling.  Gastrointestinal: Positive for nausea, abdominal pain and constipation. Negative for vomiting and diarrhea.  Genitourinary: Positive for testicular pain (mild ache). Negative for dysuria, frequency and decreased urine volume.  Musculoskeletal: Negative for arthralgias, back pain, gait problem and myalgias.  Skin: Negative for color change and wound.  Neurological: Negative for dizziness, syncope, light-headedness and headaches.   Psychiatric/Behavioral: Negative for confusion and agitation.  All other systems reviewed and are negative.    Allergies  Review of patient's allergies indicates no known allergies.  Home Medications   Current Outpatient Rx  Name  Route  Sig  Dispense  Refill  . ibuprofen (ADVIL,MOTRIN) 200 MG tablet   Oral   Take 800 mg by mouth every 8 (eight) hours as needed for moderate pain. For headache pain         . magnesium hydroxide (MILK OF MAGNESIA) 400 MG/5ML suspension   Oral   Take 30 mLs by mouth daily as needed for mild constipation.         . simethicone (MYLICON) 80 MG chewable tablet   Oral   Chew 160 mg by mouth every 6 (six) hours as needed for flatulence.          BP 138/85  Pulse 120  Temp(Src) 99.5 F (37.5 C) (Oral)  Resp 18  SpO2 96%  Physical Exam  Nursing note and vitals reviewed. Constitutional: He is oriented to person, place, and time. He appears well-developed and well-nourished. He appears distressed (in pain).  HENT:  Head: Normocephalic and atraumatic.  Mouth/Throat: Oropharynx is clear and moist.  Eyes: Conjunctivae and EOM are normal. Pupils are equal, round, and reactive to light.  Neck: Normal range of motion. Neck supple.  Cardiovascular: Regular rhythm, normal heart sounds and intact distal pulses.  Tachycardia present.   Pulmonary/Chest: Effort normal and breath sounds normal. No respiratory distress.  Abdominal: Soft. Bowel sounds are decreased. There is tenderness in the left lower quadrant.  There is guarding. There is no rigidity and no rebound. Hernia confirmed negative in the right inguinal area and confirmed negative in the left inguinal area.  Genitourinary: Testes normal and penis normal. Cremasteric reflex is present. Right testis shows no swelling and no tenderness. Cremasteric reflex is not absent on the right side. Left testis shows no swelling and no tenderness. Cremasteric reflex is not absent on the left side.   Musculoskeletal: Normal range of motion. He exhibits no edema and no tenderness.  Lymphadenopathy:       Right: No inguinal adenopathy present.       Left: No inguinal adenopathy present.  Neurological: He is alert and oriented to person, place, and time. No cranial nerve deficit. Coordination normal.  Skin: Skin is warm and dry. No rash noted.  Psychiatric: He has a normal mood and affect. His behavior is normal.    ED Course  Procedures (including critical care time) Labs Review Labs Reviewed  CBC WITH DIFFERENTIAL - Abnormal; Notable for the following:    WBC 14.9 (*)    Neutro Abs 10.4 (*)    Monocytes Relative 15 (*)    Monocytes Absolute 2.3 (*)    All other components within normal limits  URINALYSIS, ROUTINE W REFLEX MICROSCOPIC - Abnormal; Notable for the following:    Hgb urine dipstick TRACE (*)    Ketones, ur 15 (*)    All other components within normal limits  LIPASE, BLOOD  COMPREHENSIVE METABOLIC PANEL  LACTIC ACID, PLASMA  URINE MICROSCOPIC-ADD ON  URINALYSIS, ROUTINE W REFLEX MICROSCOPIC  BASIC METABOLIC PANEL  CBC   Imaging Review Ct Abdomen Pelvis W Contrast  08/03/2013   CLINICAL DATA:  Left lower quadrant pain with nausea. Abdominal distention.  EXAM: CT ABDOMEN AND PELVIS WITH CONTRAST  TECHNIQUE: Multidetector CT imaging of the abdomen and pelvis was performed using the standard protocol following bolus administration of intravenous contrast.  CONTRAST:  OMNIPAQUE IOHEXOL 300 MG/ML  SOLN  COMPARISON:  None.  FINDINGS: Lower Chest: Atelectasis at the lung bases. Normal heart size without pericardial or pleural effusion.  Abdomen/Pelvis: Moderate hepatic steatosis, without focal liver lesion. 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  Normal stomach, pancreas, gallbladder, biliary tract, adrenal glands. Interpolar right renal cyst. 9 mm. Normal left kidney.  No retroperitoneal or retrocrural adenopathy. Small bowel  malrotation, with small bowel positioned in the right side of the abdomen and the colon positioned primarily within the left side of the abdomen.  Extensive colonic diverticulosis. Wall thickening of and edema surrounding the sigmoid, including on image 71/series 2. No surrounding abscess or free perforation.  The ileocecal junction and cecum are positioned within the right paracentral pelvis. Normal appendix. Small bowel is not dilated.  No pelvic adenopathy. Normal urinary bladder and prostate. No significant free fluid.  Bones/Musculoskeletal:  No acute osseous abnormality.  IMPRESSION: 1. Uncomplicated sigmoid diverticulitis. 2. 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   Electronically Signed   By: Jeronimo Greaves M.D.   On: 08/03/2013 17:54    EKG Interpretation   None       MDM  28 yo M with no pertinent history who presents with LLQ pain. Afebrile, tachycardic to 120, otherwise HD stable. TTP in LLQ with voluntary guarding. No significant testicular pain on exam, no incarcerated inguinal hernia noted. WBC elevated to 14.9, otherwise labs including CMP and lipase normal. Obtained CT given significant pain on exam.  CT showed diverticulitis without perforation or abscess. Given cipro and flagyl IV. Incidentally noted to have malrotation without complication. Treated with two doses of pain medication with improvement of pain. Tachycardia persistent despite 2 L NS. Given persistent tachycardia, he was admitted to Hospitalist service. Patient updated on plan, he was in agreement. He remained HD stable while in the ED.   Reviewed imaging, labs and previous medical records, utilized in MDM  Discussed case with Dr. Denton LankSteinl  Clinical Impression 1. Sepsis secondary to diverticulitis     Margie BilletMathias Fayth Trefry, MD 08/03/13 2136

## 2013-08-03 NOTE — ED Notes (Signed)
Father comes to [facility] and reports change in status.  Pt into triage, reports he went to bathroom to void, he unbuckled belt "and everything shifted".  Pt has distended abdomen, reports last bowel movement was yesterday, is not passing gas. +nausea, no vomiting.  Reports onset of testicular pain when voiding, denies any dysuria.  Pt's abdomen is tight.

## 2013-08-03 NOTE — Patient Instructions (Signed)
Abdominal Pain, Adult °Many things can cause abdominal pain. Usually, abdominal pain is not caused by a disease and will improve without treatment. It can often be observed and treated at home. Your health care provider will do a physical exam and possibly order blood tests and X-rays to help determine the seriousness of your pain. However, in many cases, more time must pass before a clear cause of the pain can be found. Before that point, your health care provider may not know if you need more testing or further treatment. °HOME CARE INSTRUCTIONS  °Monitor your abdominal pain for any changes. The following actions may help to alleviate any discomfort you are experiencing: °· Only take over-the-counter or prescription medicines as directed by your health care provider. °· Do not take laxatives unless directed to do so by your health care provider. °· Try a clear liquid diet (broth, tea, or water) as directed by your health care provider. Slowly move to a bland diet as tolerated. °SEEK MEDICAL CARE IF: °· You have unexplained abdominal pain. °· You have abdominal pain associated with nausea or diarrhea. °· You have pain when you urinate or have a bowel movement. °· You experience abdominal pain that wakes you in the night. °· You have abdominal pain that is worsened or improved by eating food. °· You have abdominal pain that is worsened with eating fatty foods. °SEEK IMMEDIATE MEDICAL CARE IF:  °· Your pain does not go away within 2 hours. °· You have a fever. °· You keep throwing up (vomiting). °· Your pain is felt only in portions of the abdomen, such as the right side or the left lower portion of the abdomen. °· You pass bloody or black tarry stools. °MAKE SURE YOU: °· Understand these instructions.   °· Will watch your condition.   °· Will get help right away if you are not doing well or get worse.   °Document Released: 04/15/2005 Document Revised: 04/26/2013 Document Reviewed: 03/15/2013 °[employer]® Patient  Information ©2014 [employer]. ° °

## 2013-08-04 DIAGNOSIS — Q433 Congenital malformations of intestinal fixation: Secondary | ICD-10-CM

## 2013-08-04 DIAGNOSIS — R6510 Systemic inflammatory response syndrome (SIRS) of non-infectious origin without acute organ dysfunction: Secondary | ICD-10-CM

## 2013-08-04 LAB — BASIC METABOLIC PANEL
BUN: 12 mg/dL (ref 6–23)
CO2: 21 meq/L (ref 19–32)
Calcium: 8.7 mg/dL (ref 8.4–10.5)
Chloride: 102 mEq/L (ref 96–112)
Creatinine, Ser: 0.92 mg/dL (ref 0.50–1.35)
GFR calc Af Amer: >90 mL/min (ref >90)
GFR calc non Af Amer: >90 mL/min (ref >90)
GLUCOSE: 99 mg/dL (ref 70–99)
POTASSIUM: 3.8 meq/L (ref 3.7–5.3)
Sodium: 138 mEq/L (ref 137–147)

## 2013-08-04 LAB — URINE MICROSCOPIC-ADD ON

## 2013-08-04 LAB — CBC
HEMATOCRIT: 40.4 % (ref 39.0–52.0)
HEMOGLOBIN: 14 g/dL (ref 13.0–17.0)
MCH: 31.5 pg (ref 26.0–34.0)
MCHC: 34.7 g/dL (ref 30.0–36.0)
MCV: 91 fL (ref 78.0–100.0)
Platelets: 225 10*3/uL (ref 150–400)
RBC: 4.44 MIL/uL (ref 4.22–5.81)
RDW: 13.1 % (ref 11.5–15.5)
WBC: 12 10*3/uL — ABNORMAL HIGH (ref 4.0–10.5)

## 2013-08-04 MED ORDER — MUSCLE RUB 10-15 % EX CREA
TOPICAL_CREAM | CUTANEOUS | Status: DC | PRN
  Filled 2013-08-04: qty 85

## 2013-08-04 MED ORDER — ACETAMINOPHEN 325 MG PO TABS
650.0000 mg | ORAL_TABLET | Freq: Four times a day (QID) | ORAL | Status: DC | PRN
  Administered 2013-08-04 – 2013-08-05 (×3): 650 mg via ORAL
  Filled 2013-08-04 (×3): qty 2

## 2013-08-04 MED ORDER — METRONIDAZOLE 500 MG PO TABS: 500.0000 mg | ORAL_TABLET | Freq: Three times a day (TID) | ORAL | Status: DC

## 2013-08-04 MED ORDER — CIPROFLOXACIN HCL 500 MG PO TABS: 500.0000 mg | ORAL_TABLET | Freq: Two times a day (BID) | ORAL | Status: DC

## 2013-08-04 MED ORDER — POLYETHYLENE GLYCOL 3350 17 G PO PACK: 17.0000 g | PACK | Freq: Two times a day (BID) | ORAL | Status: DC

## 2013-08-04 MED ORDER — SODIUM CHLORIDE 0.9 % IV SOLN: INTRAVENOUS | Status: AC

## 2013-08-04 MED ORDER — POLYETHYLENE GLYCOL 3350 17 G PO PACK
17.0000 g | PACK | Freq: Two times a day (BID) | ORAL | Status: DC
  Administered 2013-08-04: 17 g via ORAL
  Filled 2013-08-04 (×4): qty 1

## 2013-08-04 MED ORDER — ENOXAPARIN SODIUM 40 MG/0.4ML ~~LOC~~ SOLN
40.0000 mg | SUBCUTANEOUS | Status: DC
Start: 2013-08-04 — End: 2013-08-05
  Administered 2013-08-04: 40 mg via SUBCUTANEOUS
  Filled 2013-08-04 (×2): qty 0.4

## 2013-08-04 MED ORDER — OXYCODONE HCL 5 MG PO TABS: 5.0000 mg | ORAL_TABLET | ORAL | Status: DC | PRN

## 2013-08-04 NOTE — Progress Notes (Signed)
PATIENT DETAILS Name: Brandon Dominguez Age: 28 y.o. Sex: male Date of Birth: [birth year] Admit Date: 08/03/2013 Admitting Physician Haydee Monica, MD ZOX:WRUEA, Dorinda Hill, MD  Subjective: Still complaining of left lower quadrant abdominal pain, nausea with clear liquids. No overt vomiting.  Assessment/Plan: Principal Problem:   Acute diverticulitis of intestine - Suspect clinically better.  - Continue clear liquids today - Continue Cipro and Flagyl - Betty soft, but still with tenderness on the left lower quadrant area without any rebound.  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    MEDICATIONS: Scheduled Meds: . antiseptic oral rinse  15 mL Mouth Rinse q12n4p  . chlorhexidine  15 mL Mouth Rinse BID  . ciprofloxacin  400 mg Intravenous Q12H  . metronidazole  500 mg Intravenous Q8H  . sodium chloride  3 mL Intravenous Q12H   Continuous Infusions:  PRN Meds:.HYDROmorphone (DILAUDID) injection, MUSCLE RUB, ondansetron (ZOFRAN) IV, ondansetron, oxyCODONE, simethicone  Antibiotics: Anti-infectives   Start     Dose/Rate Route Frequency Ordered Stop   08/04/13 0600  ciprofloxacin (CIPRO) IVPB 400 mg     400 mg 200 mL/hr over 60 Minutes Intravenous Every 12 hours 08/03/13 2043     08/04/13 0400  metroNIDAZOLE (FLAGYL) IVPB 500 mg     500 mg 100 mL/hr over 60 Minutes Intravenous Every 8 hours 08/03/13 2043     08/03/13 1815  metroNIDAZOLE (FLAGYL)  IVPB 500 mg     500 mg 100 mL/hr over 60 Minutes Intravenous  Once 08/03/13 1814 08/03/13 2045   08/03/13 1813  ciprofloxacin (CIPRO) IVPB 400 mg     400 mg 200 mL/hr over 60 Minutes Intravenous  Once 08/03/13 1814 08/03/13 1931       PHYSICAL EXAM: Vital signs in last 24 hours: Filed Vitals:   08/03/13 1915 08/03/13 1948 08/03/13 2103 08/04/13 0531  BP: 124/77 127/79 122/77 118/71  Pulse: 114 116 114 98  Temp:  98.7 F (37.1 C) 98.3 F (36.8 C) 98.7 F (37.1 C)  TempSrc:  Oral Oral Oral  Resp: 19 18 18 18   Height:   5\' 11"  (1.803 m)   Weight:   125 kg (275 lb 9.2 oz) 125.4 kg (276 lb 7.3 oz)  SpO2: 95% 95% 94% 93%    Weight change:  Filed Weights   08/03/13 2103 08/04/13 0531  Weight: 125 kg (275 lb 9.2 oz) 125.4 kg (276 lb 7.3 oz)   Body mass index is 38.58 kg/(m^2).   Gen Exam: Awake and alert with clear speech.   Neck: Supple, No JVD.   Chest: B/L Clear.   CVS: S1 S2 Regular, no murmurs.  Abdomen: soft, BS +,  tender in left lower quadrant area, non distended. No rebound Extremities: no edema, lower extremities warm to touch. Neurologic: Non Focal.   Skin: No Rash.   Wounds: N/A.    Intake/Output from previous day:  Intake/Output Summary (Last 24 hours) at 08/04/13 1117 Last data filed at 08/03/13 2300  Gross per 24 hour  Intake  480 ml  Output      0 ml  Net    480 ml     LAB RESULTS: CBC  Recent Labs Lab 08/03/13 1322 08/04/13 0535  WBC 14.9* 12.0*  HGB 15.8 14.0  HCT 44.6 40.4  PLT 253 225  MCV 89.6 91.0  MCH 31.7 31.5  MCHC 35.4 34.7  RDW 12.9 13.1  LYMPHSABS 2.1  --   MONOABS 2.3*  --   EOSABS 0.1  --   BASOSABS 0.0  --     Chemistries   Recent Labs Lab 08/03/13 1322 08/04/13 0535  NA 138 138  K 4.0 3.8  CL 100 102  CO2 24 21  GLUCOSE 94 99  BUN 14 12  CREATININE 0.81 0.92  CALCIUM 9.5 8.7    CBG: No results found for this basename: GLUCAP,  in the last 168 hours  GFR Estimated Creatinine Clearance: 159.7  ml/min (by C-G formula based on Cr of 0.92).  Coagulation profile No results found for this basename: INR, PROTIME,  in the last 168 hours  Cardiac Enzymes No results found for this basename: CK, CKMB, TROPONINI, MYOGLOBIN,  in the last 168 hours  No components found with this basename: POCBNP,  No results found for this basename: DDIMER,  in the last 72 hours No results found for this basename: HGBA1C,  in the last 72 hours No results found for this basename: CHOL, HDL, LDLCALC, TRIG, CHOLHDL, LDLDIRECT,  in the last 72 hours No results found for this basename: TSH, T4TOTAL, FREET3, T3FREE, THYROIDAB,  in the last 72 hours No results found for this basename: VITAMINB12, FOLATE, FERRITIN, TIBC, IRON, RETICCTPCT,  in the last 72 hours  Recent Labs  08/03/13 1322  LIPASE 33    Urine Studies No results found for this basename: UACOL, UAPR, USPG, UPH, UTP, UGL, UKET, UBIL, UHGB, UNIT, UROB, ULEU, UEPI, UWBC, URBC, UBAC, CAST, CRYS, UCOM, BILUA,  in the last 72 hours  MICROBIOLOGY: No results found for this or any previous visit (from the past 240 hour(s)).  RADIOLOGY STUDIES/RESULTS: Ct Abdomen Pelvis W Contrast  08/03/2013   CLINICAL DATA:  Left lower quadrant pain with nausea. Abdominal distention.  EXAM: CT ABDOMEN AND PELVIS WITH CONTRAST  TECHNIQUE: Multidetector CT imaging of the abdomen and pelvis was performed using the standard protocol following bolus administration of intravenous contrast.  CONTRAST:  100mL OMNIPAQUE IOHEXOL 300 MG/ML  SOLN  COMPARISON:  None.  FINDINGS: Lower Chest: Atelectasis at the lung bases. Normal heart size without pericardial or pleural effusion.  Abdomen/Pelvis: Moderate hepatic steatosis, without focal liver lesion. 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  Normal stomach, pancreas, gallbladder, biliary tract, adrenal glands. Interpolar right renal cyst. 9 mm. Normal left kidney.  No retroperitoneal or  retrocrural adenopathy. Small bowel malrotation, with small bowel positioned in the right side of the abdomen and the colon positioned primarily within the left side of the abdomen.  Extensive colonic diverticulosis. Wall thickening of and edema surrounding the sigmoid, including on image 71/series 2. No surrounding abscess or free perforation.  The ileocecal junction and cecum are positioned within the right paracentral pelvis. Normal appendix. Small bowel is not dilated.  No pelvic adenopathy. Normal urinary bladder and prostate. No significant free fluid.  Bones/Musculoskeletal:  No acute osseous abnormality.  IMPRESSION: 1. Uncomplicated sigmoid diverticulitis. 2. 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   Electronically Signed   By: Ronaldo MiyamotoKyle  Reche Dixon M.D.   On: 08/03/2013 17:54    Jeoffrey Massed, MD  [facility] Pager:[phone]  If 7PM-7AM, please contact night-coverage [email or web address] Password [email or web address] 08/04/2013, 11:17 AM   LOS: 1 day

## 2013-08-04 NOTE — Progress Notes (Signed)
Pt admitted to unit, 5w30. Pt alert and oriented. Skin intact. IV intact. Pt oriented to room, call bell placed within reach. Family at bedside. Will continue to monitor pt per MD orders.

## 2013-08-04 NOTE — Progress Notes (Signed)
Utilization review completed. Zarina Pe, RN, BSN. 

## 2013-08-04 NOTE — Discharge Summary (Signed)
Physician Discharge Summary  Robinson Brinkley WUJ:[id number] DOB: 1986/07/20 DOA: 08/03/2013  PCP: Rudi Heap, MD  Admit date: 08/03/2013 Discharge date: 08/04/2013  Time spent: 40 minutes  Recommendations for Outpatient Follow-up:  1. Cbc in 1 week at hospital follow up. 2.   Referral to gastroenterologist (Mother sees Dr. Marina Goodell with [facility]) for eval and colonoscopy.  Discharge Diagnoses:  Principal Problem:   Acute diverticulitis of intestine Active Problems:   Sinus tachycardia   Abdominal pain, acute, left lower quadrant   Nausea and vomiting in adult   Diverticulitis   Diverticulitis of colon   Malrotation of intestine   Discharge Condition: stable  Diet recommendation: soft bland diet until pain improved.  Filed Weights   08/03/13 2103 08/04/13 0531  Weight: 125 kg (275 lb 9.2 oz) 125.4 kg (276 lb 7.3 oz)    History of present illness:  28 yo obese male with hx of diverticulosis presented to the ED with chills, and acute left lower quadrant abdominal pain that started Wednesday evening at 6:30.  After the pain started he took MOM which did induce a bowel movement but also intensified his pain.  PCP directed him to the ER for a CT scan which showed acute diverticulitis as well as a mal rotation of the small bowel.    Hospital Course:   Acute Diverticulitis: Patient was admitted and started on clear liquids, IV antibiotics, miralax and pain medications. He improved quickly and was advance to a full liquid diet for dinner on 1/16. By the morning of 1/17 he was able to tolerate solid food and desired discharge to home. He will be discharged with a total of 10 days of antibiotic therapy and the recommendation to see a gastroenterologist in follow up in 4 - 6 weeks.  Mal rotation in the small bowel CT scan was evaluated by the surgical PA who felt this was an incidental finding, a chronic condition and was unrelated to his acute abdominal pain.  No acute surgical  needs.   Discharge Exam: Filed Vitals:   08/04/13 1500  BP: 135/85  Pulse: 94  Temp: 99.2 F (37.3 C)  Resp: 18    Gen Exam: Awake and alert with clear speech.  Neck: Supple, No JVD.  Chest: B/L Clear. No accessory muscle use CVS: S1 S2 Regular, no murmurs.  Abdomen: obese, soft, BS +, significantly less tender in left lower quadrant area, non distended. No rebound.   Extremities: no edema, lower extremities warm to touch.  Neurologic: Non Focal.  Skin: No Rash. Striae on abdomen. Wounds: N/A.  Discharge Instructions     Medication List    STOP taking these medications       ibuprofen 200 MG tablet  Commonly known as:  ADVIL,MOTRIN     magnesium hydroxide 400 MG/5ML suspension  Commonly known as:  MILK OF MAGNESIA      TAKE these medications       ciprofloxacin 500 MG tablet  Commonly known as:  CIPRO  Take 1 tablet (500 mg total) by mouth 2 (two) times daily.     metroNIDAZOLE 500 MG tablet  Commonly known as:  FLAGYL  Take 1 tablet (500 mg total) by mouth 3 (three) times daily.     oxyCODONE 5 MG immediate release tablet  Commonly known as:  Oxy IR/ROXICODONE  Take 1 tablet (5 mg total) by mouth every 4 (four) hours as needed for severe pain or breakthrough pain.     polyethylene glycol packet  Commonly known  as:  MIRALAX / GLYCOLAX  Take 17 g by mouth 2 (two) times daily.     simethicone 80 MG chewable tablet  Commonly known as:  MYLICON  Chew 160 mg by mouth every 6 (six) hours as needed for flatulence.       No Known Allergies    The results of significant diagnostics from this hospitalization (including imaging, microbiology, ancillary and laboratory) are listed below for reference.    Significant Diagnostic Studies: Ct Abdomen Pelvis W Contrast  08/03/2013   CLINICAL DATA:  Left lower quadrant pain with nausea. Abdominal distention.  EXAM: CT ABDOMEN AND PELVIS WITH CONTRAST  TECHNIQUE: Multidetector CT imaging of the abdomen and pelvis was  performed using the standard protocol following bolus administration of intravenous contrast.  CONTRAST:  100mL OMNIPAQUE IOHEXOL 300 MG/ML  SOLN  COMPARISON:  None.  FINDINGS: Lower Chest: Atelectasis at the lung bases. Normal heart size without pericardial or pleural effusion.  Abdomen/Pelvis: Moderate hepatic steatosis, without focal liver lesion. 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  Normal stomach, pancreas, gallbladder, biliary tract, adrenal glands. Interpolar right renal cyst. 9 mm. Normal left kidney.  No retroperitoneal or retrocrural adenopathy. Small bowel malrotation, with small bowel positioned in the right side of the abdomen and the colon positioned primarily within the left side of the abdomen.  Extensive colonic diverticulosis. Wall thickening of and edema surrounding the sigmoid, including on image 71/series 2. No surrounding abscess or free perforation.  The ileocecal junction and cecum are positioned within the right paracentral pelvis. Normal appendix. Small bowel is not dilated.  No pelvic adenopathy. Normal urinary bladder and prostate. No significant free fluid.  Bones/Musculoskeletal:  No acute osseous abnormality.  IMPRESSION: 1. Uncomplicated sigmoid diverticulitis. 2. 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   Electronically Signed   By: Jeronimo GreavesKyle  Talbot M.D.   On: 08/03/2013 17:54     Labs: Basic Metabolic Panel:  Recent Labs Lab 08/03/13 1322 08/04/13 0535  NA 138 138  K 4.0 3.8  CL 100 102  CO2 24 21  GLUCOSE 94 99  BUN 14 12  CREATININE 0.81 0.92  CALCIUM 9.5 8.7   Liver Function Tests:  Recent Labs Lab 08/03/13 1322  AST 24  ALT 33  ALKPHOS 86  BILITOT 0.4  PROT 8.1  ALBUMIN 3.9    Recent Labs Lab 08/03/13 1322  LIPASE 33   CBC:  Recent Labs Lab 08/03/13 1322 08/04/13 0535  WBC 14.9* 12.0*  NEUTROABS 10.4*  --   HGB 15.8 14.0  HCT  44.6 40.4  MCV 89.6 91.0  PLT 253 225   SignedConley Canal:  York, Marianne L, PA-C [phone]  [facility] 08/05/2013, 3:52 PM   attending  - Patient seen and examined, the with the above assessment and plan. Please see my progress note from today, however patient has improved very rapidly. Very minimal/mild tenderness in the left quadrant today. Has tolerated a regular diet. Stable for discharge.   Windell NorfolkS Ghimire MD

## 2013-08-04 NOTE — Progress Notes (Signed)
Pt c/o pain, stating "I need something for pain that will last longer." Notified NP on call, new orders given.

## 2013-08-04 NOTE — ED Provider Notes (Signed)
I saw and evaluated the patient, reviewed the resident's note and I agree with the findings and plan.  EKG Interpretation    Date/Time:    Ventricular Rate:    PR Interval:    QRS Duration:   QT Interval:    QTC Calculation:   R Axis:     Text Interpretation:              Pt with left lower quadrant pain and tenderness. No peritoneal signs. Labs. Ivf. Iv abx. Ct.     Suzi RootsKevin E Ruchel Brandenburger, MD 08/04/13 229 [phone]

## 2013-08-05 LAB — BASIC METABOLIC PANEL
BUN: 8 mg/dL (ref 6–23)
CALCIUM: 8.5 mg/dL (ref 8.4–10.5)
CHLORIDE: 106 meq/L (ref 96–112)
CO2: 25 mEq/L (ref 19–32)
CREATININE: 0.84 mg/dL (ref 0.50–1.35)
GFR calc Af Amer: >90 mL/min (ref >90)
GFR calc non Af Amer: >90 mL/min (ref >90)
Glucose, Bld: 99 mg/dL (ref 70–99)
Potassium: 3.5 mEq/L — ABNORMAL LOW (ref 3.7–5.3)
Sodium: 143 mEq/L (ref 137–147)

## 2013-08-05 LAB — CBC
HEMATOCRIT: 35.7 % — AB (ref 39.0–52.0)
Hemoglobin: 12.3 g/dL — ABNORMAL LOW (ref 13.0–17.0)
MCH: 30.8 pg (ref 26.0–34.0)
MCHC: 34.5 g/dL (ref 30.0–36.0)
MCV: 89.3 fL (ref 78.0–100.0)
Platelets: 228 10*3/uL (ref 150–400)
RBC: 4 MIL/uL — ABNORMAL LOW (ref 4.22–5.81)
RDW: 12.9 % (ref 11.5–15.5)
WBC: 9.3 10*3/uL (ref 4.0–10.5)

## 2013-08-05 NOTE — Progress Notes (Signed)
NURSING PROGRESS NOTE  Brandon Dominguez [id number] Discharge Data: 08/05/2013 10:05 AM Attending Provider: Maretta BeesShanker M Ghimire, MD XBM:[id number]:MOORE, Dorinda HillNALD, MD     Brandon Dominguez to be D/C'd Home per MD order.    All IV's discontinued with no bleeding noted.  All belongings returned to patient for patient to take home.   Last Vital Signs:  Blood pressure 128/71, pulse 101, temperature 98.1 F (36.7 C), temperature source Oral, resp. rate 18, height 5\' 11"  (1.803 m), weight 125.4 kg (276 lb 7.3 oz), SpO2 94.00%.  Discharge Medication List   Medication List    STOP taking these medications       ibuprofen 200 MG tablet  Commonly known as:  ADVIL,MOTRIN     magnesium hydroxide 400 MG/5ML suspension  Commonly known as:  MILK OF MAGNESIA      TAKE these medications       ciprofloxacin 500 MG tablet  Commonly known as:  CIPRO  Take 1 tablet (500 mg total) by mouth 2 (two) times daily.     metroNIDAZOLE 500 MG tablet  Commonly known as:  FLAGYL  Take 1 tablet (500 mg total) by mouth 3 (three) times daily.     oxyCODONE 5 MG immediate release tablet  Commonly known as:  Oxy IR/ROXICODONE  Take 1 tablet (5 mg total) by mouth every 4 (four) hours as needed for severe pain or breakthrough pain.     polyethylene glycol packet  Commonly known as:  MIRALAX / GLYCOLAX  Take 17 g by mouth 2 (two) times daily.     simethicone 80 MG chewable tablet  Commonly known as:  MYLICON  Chew 160 mg by mouth every 6 (six) hours as needed for flatulence.        Brandon [name], MSN, RN, [employer] EnergyCMSRN

## 2013-08-05 NOTE — Progress Notes (Signed)
PATIENT DETAILS Name: Brandon Dominguez Age: 28 y.o. Sex: male Date of Birth: November 01, 1985 Admit Date: 08/03/2013 Admitting Physician Haydee Monicaachal A David, MD MWU:[id number]:MOORE, Dorinda HillNALD, MD  Subjective: Significantly better, pain down significantly-ate eggs, sausage and cheese for breakfast this morning.  Assessment/Plan: Principal Problem:   Acute diverticulitis of intestine - Patient was admitted and started on empiric Cipro and Flagyl. Patient rapidly improved. On 1/17, on exam very minimal tenderness in the left lower quadrant area. Has already tolerated a regular diet. Suspect patient can be discharged home, he will continue with antibiotics on discharge. I've asked the family to make sure patient is on a soft diet for the next few days. Patient and family, have been advised to have the patient followup with the primary care practitioner, and also with a gastroenterologist in the next 1-2 months for evaluation of a colonoscopy.  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 antiseptic oral rinse  15 mL Mouth Rinse q12n4p  . chlorhexidine  15 mL Mouth Rinse BID  . ciprofloxacin  400 mg Intravenous Q12H  . enoxaparin (LOVENOX) injection  40 mg Subcutaneous Q24H  . metronidazole  500 mg Intravenous Q8H  . polyethylene glycol  17 g Oral BID  . sodium chloride  3 mL Intravenous Q12H   Continuous Infusions:  PRN Meds:.acetaminophen, HYDROmorphone (DILAUDID) injection, MUSCLE RUB, ondansetron (ZOFRAN) IV, ondansetron, oxyCODONE, simethicone  Antibiotics: Anti-infectives   Start     Dose/Rate Route  Frequency Ordered Stop   08/04/13 0600  ciprofloxacin (CIPRO) IVPB 400 mg     400 mg 200 mL/hr over 60 Minutes Intravenous Every 12 hours 08/03/13 2043     08/04/13 0400  metroNIDAZOLE (FLAGYL) IVPB 500 mg     500 mg 100 mL/hr over 60 Minutes Intravenous Every 8 hours 08/03/13 2043     08/04/13 0000  ciprofloxacin (CIPRO) 500 MG tablet     500 mg Oral 2 times daily 08/04/13 1543     08/04/13 0000  metroNIDAZOLE (FLAGYL) 500 MG tablet     500 mg Oral 3 times daily 08/04/13 1543     08/03/13 1815  metroNIDAZOLE (FLAGYL) IVPB 500 mg     500 mg 100 mL/hr over 60 Minutes Intravenous  Once 08/03/13 1814 08/03/13 2045   08/03/13 1813  ciprofloxacin (CIPRO) IVPB 400 mg     400 mg 200 mL/hr over 60 Minutes Intravenous  Once 08/03/13 1814 08/03/13 1931       PHYSICAL EXAM: Vital signs in last 24 hours: Filed Vitals:   08/04/13 0531 08/04/13 1500 08/04/13 1954 08/05/13 0641  BP: 118/71 135/85 128/67 128/71  Pulse: 98 94 99 101  Temp: 98.7 F (37.1 C) 99.2 F (37.3 C) 98 F (36.7 C) 98.1 F (36.7 C)  TempSrc: Oral Oral Oral Oral  Resp: 18 18 18 18   Height:      Weight: 125.4 kg (276 lb 7.3 oz)     SpO2: 93% 98% 96% 94%    Weight change:  Filed Weights   08/03/13 2103 08/04/13 0531  Weight: 125 kg (275 lb 9.2 oz) 125.4 kg (276 lb 7.3 oz)   Body mass index is  38.58 kg/(m^2).   Gen Exam: Awake and alert with clear speech.   Neck: Supple, No JVD.   Chest: B/L Clear.   CVS: S1 S2 Regular, no murmurs.  Abdomen: soft, BS +, mildly tender in left lower quadrant area, non distended. No rebound Extremities: no edema, lower extremities warm to touch. Neurologic: Non Focal.   Skin: No Rash.   Wounds: N/A.    Intake/Output from previous day:  Intake/Output Summary (Last 24 hours) at 08/05/13 0919 Last data filed at 08/05/13 1610  Gross per 24 hour  Intake 1801.67 ml  Output      0 ml  Net 1801.67 ml     LAB RESULTS: CBC  Recent Labs Lab 08/03/13 1322 08/04/13 0535  08/05/13 0622  WBC 14.9* 12.0* 9.3  HGB 15.8 14.0 12.3*  HCT 44.6 40.4 35.7*  PLT 253 225 228  MCV 89.6 91.0 89.3  MCH 31.7 31.5 30.8  MCHC 35.4 34.7 34.5  RDW 12.9 13.1 12.9  LYMPHSABS 2.1  --   --   MONOABS 2.3*  --   --   EOSABS 0.1  --   --   BASOSABS 0.0  --   --     Chemistries   Recent Labs Lab 08/03/13 1322 08/04/13 0535 08/05/13 0622  NA 138 138 143  K 4.0 3.8 3.5*  CL 100 102 106  CO2 24 21 25   GLUCOSE 94 99 99  BUN 14 12 8   CREATININE 0.81 0.92 0.84  CALCIUM 9.5 8.7 8.5    CBG: No results found for this basename: GLUCAP,  in the last 168 hours  GFR Estimated Creatinine Clearance: 174.9 ml/min (by C-G formula based on Cr of 0.84).  Coagulation profile No results found for this basename: INR, PROTIME,  in the last 168 hours  Cardiac Enzymes No results found for this basename: CK, CKMB, TROPONINI, MYOGLOBIN,  in the last 168 hours  No components found with this basename: POCBNP,  No results found for this basename: DDIMER,  in the last 72 hours No results found for this basename: HGBA1C,  in the last 72 hours No results found for this basename: CHOL, HDL, LDLCALC, TRIG, CHOLHDL, LDLDIRECT,  in the last 72 hours No results found for this basename: TSH, T4TOTAL, FREET3, T3FREE, THYROIDAB,  in the last 72 hours No results found for this basename: VITAMINB12, FOLATE, FERRITIN, TIBC, IRON, RETICCTPCT,  in the last 72 hours  Recent Labs  08/03/13 1322  LIPASE 33    Urine Studies No results found for this basename: UACOL, UAPR, USPG, UPH, UTP, UGL, UKET, UBIL, UHGB, UNIT, UROB, ULEU, UEPI, UWBC, URBC, UBAC, CAST, CRYS, UCOM, BILUA,  in the last 72 hours  MICROBIOLOGY: No results found for this or any previous visit (from the past 240 hour(s)).  RADIOLOGY STUDIES/RESULTS: Ct Abdomen Pelvis W Contrast  08/03/2013   CLINICAL DATA:  Left lower quadrant pain with nausea. Abdominal distention.  EXAM: CT ABDOMEN AND PELVIS WITH CONTRAST  TECHNIQUE:  Multidetector CT imaging of the abdomen and pelvis was performed using the standard protocol following bolus administration of intravenous contrast.  CONTRAST:  OMNIPAQUE IOHEXOL 300 MG/ML  SOLN  COMPARISON:  None.  FINDINGS: Lower Chest: Atelectasis at the lung bases. Normal heart size without pericardial or pleural effusion.  Abdomen/Pelvis: Moderate hepatic steatosis, without focal liver lesion. 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  Normal stomach, pancreas, gallbladder, biliary tract, adrenal glands. Interpolar right renal cyst. 9 mm. Normal left  kidney.  No retroperitoneal or retrocrural adenopathy. Small bowel malrotation, with small bowel positioned in the right side of the abdomen and the colon positioned primarily within the left side of the abdomen.  Extensive colonic diverticulosis. Wall thickening of and edema surrounding the sigmoid, including on image 71/series 2. No surrounding abscess or free perforation.  The ileocecal junction and cecum are positioned within the right paracentral pelvis. Normal appendix. Small bowel is not dilated.  No pelvic adenopathy. Normal urinary bladder and prostate. No significant free fluid.  Bones/Musculoskeletal:  No acute osseous abnormality.  IMPRESSION: 1. Uncomplicated sigmoid diverticulitis. 2. 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   Electronically Signed   By: Jeronimo Greaves M.D.   On: 08/03/2013 17:54    Jeoffrey Massed, MD  [facility] Pager:[phone]  If 7PM-7AM, please contact night-coverage [email or web address] Password [email or web address] 08/05/2013, 9:19 AM   LOS: 2 days

## 2013-08-08 ENCOUNTER — Ambulatory Visit (INDEPENDENT_AMBULATORY_CARE_PROVIDER_SITE_OTHER): Payer: BC Managed Care – PPO | Admitting: Family Medicine

## 2013-08-08 ENCOUNTER — Encounter: Payer: Self-pay | Admitting: Family Medicine

## 2013-08-08 VITALS — BP 128/87 | HR 92 | Temp 98.4°F | Ht 71.0 in | Wt 268.0 lb

## 2013-08-08 DIAGNOSIS — R110 Nausea: Secondary | ICD-10-CM

## 2013-08-08 DIAGNOSIS — K5732 Diverticulitis of large intestine without perforation or abscess without bleeding: Secondary | ICD-10-CM

## 2013-08-08 MED ORDER — ONDANSETRON HCL 8 MG PO TABS: 8.0000 mg | ORAL_TABLET | Freq: Three times a day (TID) | ORAL | Status: DC | PRN

## 2013-08-08 NOTE — Patient Instructions (Signed)
Diverticulitis °A diverticulum is a small pouch or sac on the colon. Diverticulosis is the presence of these diverticula on the colon. Diverticulitis is the irritation (inflammation) or infection of diverticula. °CAUSES  °The colon and its diverticula contain bacteria. If food particles block the tiny opening to a diverticulum, the bacteria inside can grow and cause an increase in pressure. This leads to infection and inflammation and is called diverticulitis. °SYMPTOMS  °· Abdominal pain and tenderness. Usually, the pain is located on the left side of your abdomen. However, it could be located elsewhere. °· Fever. °· Bloating. °· Feeling sick to your stomach (nausea). °· Throwing up (vomiting). °· Abnormal stools. °DIAGNOSIS  °Your caregiver will take a history and perform a physical exam. Since many things can cause abdominal pain, other tests may be necessary. Tests may include: °· Blood tests. °· Urine tests. °· X-ray of the abdomen. °· CT scan of the abdomen. °Sometimes, surgery is needed to determine if diverticulitis or other conditions are causing your symptoms. °TREATMENT  °Most of the time, you can be treated without surgery. Treatment includes: °· Resting the bowels by only having liquids for a few days. As you improve, you will need to eat a low-fiber diet. °· Intravenous (IV) fluids if you are losing body fluids (dehydrated). °· Antibiotic medicines that treat infections may be given. °· Pain and nausea medicine, if needed. °· Surgery if the inflamed diverticulum has burst. °HOME CARE INSTRUCTIONS  °· Try a clear liquid diet (broth, tea, or water for as long as directed by your caregiver). You may then gradually begin a low-fiber diet as tolerated.  °A low-fiber diet is a diet with less than 10 grams of fiber. Choose the foods below to reduce fiber in the diet: °· White breads, cereals, rice, and pasta. °· Cooked fruits and vegetables or soft fresh fruits and vegetables without the skin. °· Ground or  well-cooked tender beef, ham, veal, lamb, pork, or poultry. °· Eggs and seafood. °· After your diverticulitis symptoms have improved, your caregiver may put you on a high-fiber diet. A high-fiber diet includes 14 grams of fiber for every 1000 calories consumed. For a standard 2000 calorie diet, you would need 28 grams of fiber. Follow these diet guidelines to help you increase the fiber in your diet. It is important to slowly increase the amount fiber in your diet to avoid gas, constipation, and bloating. °· Choose whole-grain breads, cereals, pasta, and brown rice. °· Choose fresh fruits and vegetables with the skin on. Do not overcook vegetables because the more vegetables are cooked, the more fiber is lost. °· Choose more nuts, seeds, legumes, dried peas, beans, and lentils. °· Look for food products that have greater than 3 grams of fiber per serving on the Nutrition Facts label. °· Take all medicine as directed by your caregiver. °· If your caregiver has given you a follow-up appointment, it is very important that you go. Not going could result in lasting (chronic) or permanent injury, pain, and disability. If there is any problem keeping the appointment, call to reschedule. °SEEK MEDICAL CARE IF:  °· Your pain does not improve. °· You have a hard time advancing your diet beyond clear liquids. °· Your bowel movements do not return to normal. °SEEK IMMEDIATE MEDICAL CARE IF:  °· Your pain becomes worse. °· You have an oral temperature above 102° F (38.9° C), not controlled by medicine. °· You have repeated vomiting. °· You have bloody or black, tarry stools. °·   Symptoms that brought you to your caregiver become worse or are not getting better. °MAKE SURE YOU:  °· Understand these instructions. °· Will watch your condition. °· Will get help right away if you are not doing well or get worse. °Document Released: 04/15/2005 Document Revised: 09/28/2011 Document Reviewed: 08/11/2010 °[employer]® Patient Information  ©2014 [employer]. ° °

## 2013-08-08 NOTE — Progress Notes (Signed)
   Subjective:    Patient ID: Brandon Dominguez, male    DOB: Dec 17, 1985, 28 y.o.   MRN: [id number]  HPI This 28 y.o. male presents for evaluation of follow up on acute abdominal pain. He was seen in the ED for diverticulitis and tx with cipro and flagyl and feels a lot Better.  He was told he has pan diverticulosis.  He has family hx. He needs referral To GI.   Review of Systems No chest pain, SOB, HA, dizziness, vision change, N/V, diarrhea, constipation, dysuria, urinary urgency or frequency, myalgias, arthralgias or rash.     Objective:   Physical Exam Vital signs noted  Well developed well nourished male.  HEENT - Head atraumatic Normocephalic Respiratory - Lungs CTA bilateral Cardiac - RRR S1 and S2 without murmur GI - Abdomen soft Nontender and bowel sounds active x 4 Extremities - No edema. Neuro - Grossly intact.       Assessment & Plan:  Nausea alone - Plan: ondansetron (ZOFRAN) 8 MG tablet, Ambulatory referral to Gastroenterology  Diverticulitis of colon - Plan: Ambulatory referral to Gastroenterology  Deatra CanterWilliam J Oxford FNP

## 2013-08-16 ENCOUNTER — Encounter: Payer: Self-pay | Admitting: Internal Medicine

## 2013-09-14 ENCOUNTER — Ambulatory Visit: Payer: BC Managed Care – PPO | Admitting: Internal Medicine

## 2013-09-25 ENCOUNTER — Ambulatory Visit: Payer: BC Managed Care – PPO | Admitting: Family Medicine

## 2013-09-25 ENCOUNTER — Telehealth: Payer: Self-pay | Admitting: Family Medicine

## 2013-09-25 NOTE — Telephone Encounter (Signed)
Spoke with pt appt given for today to be assessed

## 2013-09-25 NOTE — Telephone Encounter (Signed)
Patient is not able to come in for an appt and would like a prescription for diverticulitis.  This is being handled in another encounter.

## 2013-09-26 NOTE — Telephone Encounter (Signed)
SPOKE WITH PT AND HE DECLINED APPT TODAY AND CANCELLED APPT THAT WAS GIVEN TO HIM FOR YEST. ADVISED PT TO CALL WHEN TIME WAS GOOD AND IF SEVERE PAIN TO PROCEED TO NEAREST ER. PT VERBALIZED UNDERSTANDING

## 2013-09-26 NOTE — Telephone Encounter (Signed)
NTBS.

## 2013-10-31 ENCOUNTER — Ambulatory Visit (INDEPENDENT_AMBULATORY_CARE_PROVIDER_SITE_OTHER): Payer: BC Managed Care – PPO | Admitting: Internal Medicine

## 2013-10-31 ENCOUNTER — Ambulatory Visit: Payer: BC Managed Care – PPO | Admitting: Internal Medicine

## 2013-10-31 ENCOUNTER — Encounter: Payer: Self-pay | Admitting: Internal Medicine

## 2013-10-31 VITALS — BP 130/78 | HR 88 | Ht 71.0 in | Wt 269.6 lb

## 2013-10-31 DIAGNOSIS — K5792 Diverticulitis of intestine, part unspecified, without perforation or abscess without bleeding: Secondary | ICD-10-CM

## 2013-10-31 DIAGNOSIS — K5732 Diverticulitis of large intestine without perforation or abscess without bleeding: Secondary | ICD-10-CM

## 2013-10-31 DIAGNOSIS — R933 Abnormal findings on diagnostic imaging of other parts of digestive tract: Secondary | ICD-10-CM

## 2013-10-31 NOTE — Patient Instructions (Signed)
Please follow up with Dr. Perry as needed 

## 2013-10-31 NOTE — Progress Notes (Signed)
HISTORY OF PRESENT ILLNESS:  Brandon Dominguez is a 28 y.o. obese male who presents today as a new patient regarding recent problems with diverticulitis. His mother, Aram BeechamCynthia, is a patient of mine. Patient was in his usual state of health until January 2015 developed severe abdominal pain. He was evaluated in the emergency room. Found to have leukocytosis. CT scan of the abdomen and pelvis was performed. This revealed extensive colonic diverticulosis. As well, uncomplicated sigmoid diverticulitis. Incidental small bowel malrotation noted. He was admitted to the hospital and treated with antibiotics. His problems resolved. Several weeks later he had some transient abdominal discomfort which did not require medical attention. This resolved spontaneously. He has had no problems since. GI review of systems is otherwise remarkable for increased intestinal gas and slight urgency with bowel movements since his hospitalization. Rare and mild intermittent dysphagia mentioned as well.  REVIEW OF SYSTEMS:  All non-GI ROS negative upon  Past Medical History  Diagnosis Date  . Medical history non-contributory   . Diverticulitis   . Hepatic steatosis   . Diverticulosis   . Malrotation of intestine   . Diverticulitis     Past Surgical History  Procedure Laterality Date  . Cranial surgery      eye socket fracture    Social History Brandon ShookBryan Dominguez  reports that he has never smoked. He has never used smokeless tobacco. He reports that he drinks alcohol. He reports that he does not use illicit drugs.  family history includes Colon polyps in his mother; Diabetes in his paternal grandfather and paternal grandmother; Diverticulitis in his mother; Heart disease in his paternal aunt, paternal grandfather, and paternal uncle. There is no history of Colon cancer, Kidney disease, or Liver disease.  No Known Allergies     PHYSICAL EXAMINATION: Vital signs: BP 130/78  Pulse 88  Ht 5\' 11"  (1.803 m)  Wt 269 lb 9.6 oz  (122.29 kg)  BMI 37.62 kg/m2 General: Well-developed, well-nourished, no acute distress HEENT: Sclerae are anicteric, conjunctiva pink. Oral mucosa intact Lungs: Clear Heart: Regular Abdomen: soft, obese, nontender, nondistended, no obvious ascites, no peritoneal signs, normal bowel sounds. No organomegaly. Extremities: No edema Psychiatric: alert and oriented x3. Cooperative   ASSESSMENT:  #1. Acute sigmoid diverticulitis January 2015. Resolved #2. Malrotation of the gut without complication   PLAN:  #1. Discussion on diverticulitis #2. Discussion on gut malrotation #3. Return to the care of your PCP. GI followup as needed

## 2013-12-07 ENCOUNTER — Telehealth: Payer: Self-pay | Admitting: Family Medicine

## 2013-12-07 ENCOUNTER — Encounter: Payer: Self-pay | Admitting: Family Medicine

## 2013-12-07 ENCOUNTER — Ambulatory Visit (INDEPENDENT_AMBULATORY_CARE_PROVIDER_SITE_OTHER): Payer: BC Managed Care – PPO | Admitting: Family Medicine

## 2013-12-07 VITALS — BP 125/76 | HR 110 | Temp 98.2°F | Ht 71.0 in | Wt 272.2 lb

## 2013-12-07 DIAGNOSIS — J069 Acute upper respiratory infection, unspecified: Secondary | ICD-10-CM

## 2013-12-07 DIAGNOSIS — J029 Acute pharyngitis, unspecified: Secondary | ICD-10-CM

## 2013-12-07 LAB — POCT RAPID STREP A (OFFICE): Rapid Strep A Screen: NEGATIVE

## 2013-12-07 MED ORDER — AZITHROMYCIN 250 MG PO TABS: ORAL_TABLET | ORAL | Status: DC

## 2013-12-07 NOTE — Progress Notes (Signed)
   Subjective:    Patient ID: Brandon Dominguez, male    DOB: [birth year], 28 y.o.   MRN: [id number]  HPI This 28 y.o. male presents for evaluation of uri sx's for 2 days and sore throat.  He has been Having some nasal congestion.   Review of Systems No chest pain, SOB, HA, dizziness, vision change, N/V, diarrhea, constipation, dysuria, urinary urgency or frequency, myalgias, arthralgias or rash.     Objective:   Physical Exam Vital signs noted  Well developed well nourished male.  HEENT - Head atraumatic Normocephalic                Eyes - PERRLA, Conjuctiva - clear Sclera- Clear EOMI                Ears - EAC's Wnl TM's Wnl Gross Hearing WNL                Nose - Nares patent                 Throat - oropharanx wnl Respiratory - Lungs CTA bilateral Cardiac - RRR S1 and S2 without murmur GI - Abdomen soft Nontender and bowel sounds active x 4 Extremities - No edema. Neuro - Grossly intact.  Results for orders placed in visit on 12/07/13  POCT RAPID STREP A (OFFICE)      Result Value Ref Range   Rapid Strep A Screen Negative  Negative       Assessment & Plan:  Sore throat - Plan: POCT rapid strep A, azithromycin (ZITHROMAX) 250 MG tablet  URI (upper respiratory infection) - Plan: azithromycin (ZITHROMAX) 250 MG tablet  Push po fluids, rest, tylenol and motrin otc prn as directed for fever, arthralgias, and myalgias.  Follow up prn if sx's continue or persist.  Deatra CanterWilliam J Mattix Imhof FNP

## 2013-12-07 NOTE — Telephone Encounter (Signed)
Patient notified

## 2013-12-12 ENCOUNTER — Telehealth: Payer: Self-pay | Admitting: Family Medicine

## 2013-12-12 NOTE — Telephone Encounter (Signed)
Aware, get OTC medications to treat symptoms because it is probably due to virus or allergies.

## 2014-05-25 ENCOUNTER — Ambulatory Visit: Payer: BC Managed Care – PPO | Admitting: Medical

## 2014-05-31 ENCOUNTER — Ambulatory Visit (INDEPENDENT_AMBULATORY_CARE_PROVIDER_SITE_OTHER): Payer: 59 | Admitting: Medical

## 2014-05-31 ENCOUNTER — Encounter: Payer: Self-pay | Admitting: Medical

## 2014-05-31 VITALS — BP 132/81 | HR 70 | Temp 98.3°F | Ht 70.5 in | Wt 265.0 lb

## 2014-05-31 DIAGNOSIS — J4520 Mild intermittent asthma, uncomplicated: Secondary | ICD-10-CM

## 2014-05-31 DIAGNOSIS — J309 Allergic rhinitis, unspecified: Secondary | ICD-10-CM | POA: Insufficient documentation

## 2014-05-31 DIAGNOSIS — J3089 Other allergic rhinitis: Secondary | ICD-10-CM

## 2014-05-31 DIAGNOSIS — K5732 Diverticulitis of large intestine without perforation or abscess without bleeding: Secondary | ICD-10-CM

## 2014-05-31 MED ORDER — ALBUTEROL SULFATE HFA 108 (90 BASE) MCG/ACT IN AERS: 2.0000 | INHALATION_SPRAY | Freq: Four times a day (QID) | RESPIRATORY_TRACT | Status: DC | PRN

## 2014-05-31 NOTE — Assessment & Plan Note (Signed)
Asthma- pt thinks based on occasional transient wheeze when changes temperatures dramaticaly hot to cold rooms.

## 2014-05-31 NOTE — Progress Notes (Signed)
Pre visit review using our clinic review tool, if applicable. No additional management support is needed unless otherwise documented below in the visit note. 

## 2014-05-31 NOTE — Patient Instructions (Signed)
Your current conditions appear in control. Use zyrtec for allergies and you declined nasal steroid. I will give your albuterol if your occasional wheezing becomes worse.(Can worsen with allergy flair).  For your diverticulosis, watch for diverticulitis symptoms and if such occur asked to come in.  Follow up 2-3 months for wellness exam fasting in the am. You can go ahead and schedule that.

## 2014-05-31 NOTE — Assessment & Plan Note (Signed)
Allergies- year round. Does not use meds. Sneezes and some itching eyes year round.(Mild) ON severe days zyrtec.

## 2014-05-31 NOTE — Progress Notes (Signed)
Subjective:    Patient ID: Brandon Dominguez, male    DOB: 11/12/1985, 28 y.o.   MRN: [id number]  HPI   PMH, PSH, FH, Social hx, and surgical hx reviewed.  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   Allergies- year round. Does not use meds. Sneezes and some itching eyes year round.(Mild) ON severe days zyrtec.  Asthma- pt thinks based on occasional transient wheeze when changes temperatures dramaticaly hot to cold rooms.  Depression. Remote hx of diagnosis when very young and sleeping a lot. Pt disagrees with that dx. Pt states misdiagnosis by Dr. Only used 1 tab po x 1 day.   Rt eye surgery post fx(Hx of)  Recent very faint st, hoarse voice. Mild cough x 3 days. Pt states feeling a lot better today.  Past Medical History  Diagnosis Date  . Medical history non-contributory   . Diverticulitis   . Hepatic steatosis   . Diverticulosis   . Malrotation of intestine   . Diverticulitis   . Allergy   . Asthma   . Depression     History   Social History  . Marital Status: Single    Spouse Name: N/A    Number of Children: N/A  . Years of Education: N/A   Occupational History  . Not on file.   Social History Main Topics  . Smoking status: Never Smoker   . Smokeless tobacco: Never Used  . Alcohol Use: Yes     Comment: occassional  . Drug Use: No  . Sexual Activity: Not on file   Other Topics Concern  . Not on file   Social History Narrative    Past Surgical History  Procedure Laterality Date  . Cranial surgery      eye socket fracture  . Eye surgery      Put plate in    Family History  Problem Relation Age of Onset  . Diverticulitis Mother   . Colon cancer Neg Hx   . Colon polyps Mother   . Diabetes Paternal Grandmother   . Diabetes Paternal Grandfather   . Heart disease Paternal Grandfather   . Heart disease Paternal Uncle   . Kidney disease Neg Hx   . Liver disease Neg Hx   . Heart disease  Paternal Aunt     No Known Allergies  Current Outpatient Prescriptions on File Prior to Visit  Medication Sig Dispense Refill  . docusate sodium (COLACE) 100 MG capsule Take 100 mg by mouth 2 (two) times daily.    . ondansetron (ZOFRAN) 8 MG tablet Take 1 tablet (8 mg total) by mouth every 8 (eight) hours as needed for nausea or vomiting. 20 tablet 1   No current facility-administered medications on file prior to visit.    BP 132/81 mmHg  Pulse 70  Temp(Src) 98.3 F (36.8 C) (Oral)  Ht 5' 10.5" (1.791 m)  Wt 265 lb (120.203 kg)  BMI 37.47 kg/m2  SpO2 96%          Review of Systems  Constitutional: Negative for chills, diaphoresis, fatigue and unexpected weight change.  HENT:       See hpi.  Respiratory: Negative for chest tightness, shortness of breath and wheezing.        See hpi.  Cardiovascular: Negative for chest pain and palpitations.  Gastrointestinal: Negative.   Neurological: Negative.   Hematological: Negative for adenopathy. Does not bruise/bleed easily.  Psychiatric/Behavioral: Negative for suicidal ideas,  hallucinations, behavioral problems, confusion, sleep disturbance, self-injury, dysphoric mood and agitation. The patient is not nervous/anxious and is not hyperactive.        Objective:   Physical Exam   General Appearance- Not in acute distress.  HEENT Eyes- Scleraeral/Conjuntiva-bilat- Not Yellow. Mouth & Throat- Normal. No sinus pressure. Normal canals and tm's  Chest and Lung Exam Auscultation: Breath sounds:-Normal. CTA Adventitious sounds:- No Adventitious sounds.  Cardiovascular Auscultation:Rythm - Regular. Heart Sounds -Normal heart sounds.  Abdomen Inspection:-Inspection Normal.  Palpation/Perucssion: Palpation and Percussion of the abdomen reveal- Non Tender, No Rebound tenderness, No rigidity(Guarding) and No Palpable abdominal masses.  Liver:-Normal.  Spleen:- Normal.   Back - no cva tenderness.        Assessment &  Plan:

## 2014-05-31 NOTE — Assessment & Plan Note (Signed)
Controlled recently. Has seen Gi Dr. Marina GoodellPerry.

## 2015-05-30 ENCOUNTER — Ambulatory Visit: Payer: Self-pay | Admitting: Family

## 2015-05-30 ENCOUNTER — Encounter: Payer: Self-pay | Admitting: Family

## 2015-05-30 ENCOUNTER — Ambulatory Visit: Payer: Self-pay | Admitting: Family Medicine

## 2015-05-30 VITALS — BP 119/74 | HR 108 | Temp 99.9°F | Ht 70.5 in | Wt 258.2 lb

## 2015-05-30 DIAGNOSIS — J309 Allergic rhinitis, unspecified: Secondary | ICD-10-CM

## 2015-05-30 DIAGNOSIS — J069 Acute upper respiratory infection, unspecified: Secondary | ICD-10-CM

## 2015-05-30 MED ORDER — FLUTICASONE PROPIONATE 50 MCG/ACT NA SUSP: 2.0000 | Freq: Every day | NASAL | Status: DC

## 2015-05-30 MED ORDER — AZITHROMYCIN 250 MG PO TABS: ORAL_TABLET | ORAL | Status: DC

## 2015-05-30 NOTE — Patient Instructions (Signed)
Upper Respiratory Infection, Adult Most upper respiratory infections (URIs) are a viral infection of the air passages leading to the lungs. A URI affects the nose, throat, and upper air passages. The most common type of URI is nasopharyngitis and is typically referred to as "the common cold." URIs run their course and usually go away on their own. Most of the time, a URI does not require medical attention, but sometimes a bacterial infection in the upper airways can follow a viral infection. This is called a secondary infection. Sinus and middle ear infections are common types of secondary upper respiratory infections. Bacterial pneumonia can also complicate a URI. A URI can worsen asthma and chronic obstructive pulmonary disease (COPD). Sometimes, these complications can require emergency medical care and may be life threatening.  CAUSES Almost all URIs are caused by viruses. A virus is a type of germ and can spread from one person to another.  RISKS FACTORS You may be at risk for a URI if:   You smoke.   You have chronic heart or lung disease.  You have a weakened defense (immune) system.   You are very young or very old.   You have nasal allergies or asthma.  You work in crowded or poorly ventilated areas.  You work in health care facilities or schools. SIGNS AND SYMPTOMS  Symptoms typically develop 2-3 days after you come in contact with a cold virus. Most viral URIs last 7-10 days. However, viral URIs from the influenza virus (flu virus) can last 14-18 days and are typically more severe. Symptoms may include:   Runny or stuffy (congested) nose.   Sneezing.   Cough.   Sore throat.   Headache.   Fatigue.   Fever.   Loss of appetite.   Pain in your forehead, behind your eyes, and over your cheekbones (sinus pain).  Muscle aches.  DIAGNOSIS  Your health care provider may diagnose a URI by:  Physical exam.  Tests to check that your symptoms are not due to  another condition such as:  Strep throat.  Sinusitis.  Pneumonia.  Asthma. TREATMENT  A URI goes away on its own with time. It cannot be cured with medicines, but medicines may be prescribed or recommended to relieve symptoms. Medicines may help:  Reduce your fever.  Reduce your cough.  Relieve nasal congestion. HOME CARE INSTRUCTIONS   Take medicines only as directed by your health care provider.   Gargle warm saltwater or take cough drops to comfort your throat as directed by your health care provider.  Use a warm mist humidifier or inhale steam from a shower to increase air moisture. This may make it easier to breathe.  Drink enough fluid to keep your urine clear or pale yellow.   Eat soups and other clear broths and maintain good nutrition.   Rest as needed.   Return to work when your temperature has returned to normal or as your health care provider advises. You may need to stay home longer to avoid infecting others. You can also use a face mask and careful hand washing to prevent spread of the virus.  Increase the usage of your inhaler if you have asthma.   Do not use any tobacco products, including cigarettes, chewing tobacco, or electronic cigarettes. If you need help quitting, ask your health care provider. PREVENTION  The best way to protect yourself from getting a cold is to practice good hygiene.   Avoid oral or hand contact with people with cold   symptoms.   Wash your hands often if contact occurs.  There is no clear evidence that vitamin C, vitamin E, echinacea, or exercise reduces the chance of developing a cold. However, it is always recommended to get plenty of rest, exercise, and practice good nutrition.  SEEK MEDICAL CARE IF:   You are getting worse rather than better.   Your symptoms are not controlled by medicine.   You have chills.  You have worsening shortness of breath.  You have brown or red mucus.  You have yellow or brown nasal  discharge.  You have pain in your face, especially when you bend forward.  You have a fever.  You have swollen neck glands.  You have pain while swallowing.  You have white areas in the back of your throat. SEEK IMMEDIATE MEDICAL CARE IF:   You have severe or persistent:  Headache.  Ear pain.  Sinus pain.  Chest pain.  You have chronic lung disease and any of the following:  Wheezing.  Prolonged cough.  Coughing up blood.  A change in your usual mucus.  You have a stiff neck.  You have changes in your:  Vision.  Hearing.  Thinking.  Mood. MAKE SURE YOU:   Understand these instructions.  Will watch your condition.  Will get help right away if you are not doing well or get worse.   This information is not intended to replace advice given to you by your health care provider. Make sure you discuss any questions you have with your health care provider.   Document Released: 12/30/2000 Document Revised: 11/20/2014 Document Reviewed: 10/11/2013 Elsevier Interactive Patient Education 2016 [employer]vier Inc.  

## 2015-05-30 NOTE — Progress Notes (Signed)
Subjective:    Patient ID: Brandon Dominguez, male    DOB: 1985-12-11, 29 y.o.   MRN: [id number]  Cough This is a new problem. The current episode started in the past 7 days. The problem has been unchanged. The problem occurs every few minutes. The cough is productive of purulent sputum. Associated symptoms include chills, ear pain, a fever, myalgias, nasal congestion, postnasal drip and a sore throat. Pertinent negatives include no ear congestion, headaches, hemoptysis, rhinorrhea or wheezing. The symptoms are aggravated by lying down. He has tried rest (tylenol) for the symptoms. The treatment provided mild relief. There is no history of asthma or COPD.  Fever  Associated symptoms include coughing, ear pain and a sore throat. Pertinent negatives include no headaches or wheezing.      Review of Systems  Constitutional: Positive for fever and chills.  HENT: Positive for ear pain, postnasal drip and sore throat. Negative for rhinorrhea.   Respiratory: Positive for cough. Negative for hemoptysis and wheezing.   Cardiovascular: Negative.   Gastrointestinal: Negative.   Endocrine: Negative.   Genitourinary: Negative.   Musculoskeletal: Positive for myalgias.  Neurological: Negative.  Negative for headaches.  Hematological: Negative.   Psychiatric/Behavioral: Negative.   All other systems reviewed and are negative.      Objective:   Physical Exam  Constitutional: He is oriented to person, place, and time. He appears well-developed and well-nourished. No distress.  HENT:  Head: Normocephalic.  Right Ear: A middle ear effusion is present.  Left Ear: A middle ear effusion is present.  Mouth/Throat: Oropharynx is clear and moist.  Nasal passage erythemas with mild swelling    Eyes: Pupils are equal, round, and reactive to light. Right eye exhibits no discharge. Left eye exhibits no discharge.  Neck: Normal range of motion. Neck supple. No thyromegaly present.  Cardiovascular: Normal rate,  regular rhythm, normal heart sounds and intact distal pulses.   No murmur heard. Pulmonary/Chest: Effort normal and breath sounds normal. No respiratory distress. He has no wheezes.  Abdominal: Soft. Bowel sounds are normal. He exhibits no distension. There is no tenderness.  Musculoskeletal: Normal range of motion. He exhibits no edema or tenderness.  Neurological: He is alert and oriented to person, place, and time. He has normal reflexes. No cranial nerve deficit.  Skin: Skin is warm and dry. No rash noted. No erythema.  Psychiatric: He has a normal mood and affect. His behavior is normal. Judgment and thought content normal.  Vitals reviewed.   BP 119/74 mmHg  Pulse 108  Temp(Src) 99.9 F (37.7 C) (Oral)  Ht 5' 10.5" (1.791 m)  Wt 258 lb 3.2 oz (117.119 kg)  BMI 36.51 kg/m2       Assessment & Plan:  1. Acute upper respiratory infection -- Take meds as prescribed - Use a cool mist humidifier  -Use saline nose sprays frequently -Saline irrigations of the nose can be very helpful if done frequently.  * 4X daily for 1 week*  * Use of a nettie pot can be helpful with this. Follow directions with this* -Force fluids -For any cough or congestion  Use plain Mucinex- regular strength or max strength is fine   * Children- consult with Pharmacist for dosing -For fever or aces or pains- take tylenol or ibuprofen appropriate for age and weight.  * for fevers greater than 101 orally you may alternate ibuprofen and tylenol every  3 hours. -Throat lozenges if help -New toothbrush in 3 days - azithromycin (ZITHROMAX) 250 MG  tablet; Take 500 mg once, then 250 mg for four days  Dispense: 6 tablet; Refill: 0  2. Allergic rhinitis, unspecified allergic rhinitis type - fluticasone (FLONASE) 50 MCG/ACT nasal spray; Place 2 sprays into both nostrils daily.  Dispense: 16 g; Refill: 6  Jannifer Rodneyhristy Rolena Knutson, FNP

## 2015-09-05 ENCOUNTER — Ambulatory Visit (INDEPENDENT_AMBULATORY_CARE_PROVIDER_SITE_OTHER): Payer: Self-pay | Admitting: Family Medicine

## 2015-09-05 ENCOUNTER — Encounter: Payer: Self-pay | Admitting: Family Medicine

## 2015-09-05 VITALS — BP 140/86 | HR 98 | Temp 97.5°F | Ht 70.5 in | Wt 263.8 lb

## 2015-09-05 DIAGNOSIS — R509 Fever, unspecified: Secondary | ICD-10-CM

## 2015-09-05 DIAGNOSIS — R52 Pain, unspecified: Secondary | ICD-10-CM

## 2015-09-05 DIAGNOSIS — J029 Acute pharyngitis, unspecified: Secondary | ICD-10-CM

## 2015-09-05 LAB — POCT RAPID STREP A (OFFICE): Rapid Strep A Screen: NEGATIVE

## 2015-09-05 LAB — POCT INFLUENZA A/B
INFLUENZA A, POC: NEGATIVE
Influenza B, POC: NEGATIVE

## 2015-09-05 MED ORDER — AMOXICILLIN 875 MG PO TABS: 875.0000 mg | ORAL_TABLET | Freq: Two times a day (BID) | ORAL | Status: DC

## 2015-09-05 MED ORDER — GUAIFENESIN-CODEINE 100-10 MG/5ML PO SOLN: 5.0000 mL | Freq: Three times a day (TID) | ORAL | Status: DC | PRN

## 2015-09-05 NOTE — Addendum Note (Signed)
Addended by: Fawn Kirk on: 09/05/2015 01:14 PM   Modules accepted: Kipp Brood

## 2015-09-05 NOTE — Addendum Note (Signed)
Addended by: Fawn Kirk on: 09/05/2015 01:04 PM   Modules accepted: Kipp Brood

## 2015-09-05 NOTE — Patient Instructions (Signed)
Great to meet you!  If you are not continuing to get better by Sunday start the antibiotics  It looks like you have had a virus, I would expect it to be getting better today and the next 2-3 days.   Please come back with any concerns

## 2015-09-05 NOTE — Progress Notes (Signed)
   HPI  Patient presents today  Here with acute illness.  He has 5 days of cough, sore throat, subjective fever , temperature to 100.2, and body aches.  today he feels like he is improving a little bit He's had no problems tolerating fluids or fluid. No chestpain or dyspnea. Has had a few sick contacts with similar illness  Had a z pack 3 months ago and completely resolved in the interim   PMH: Smoking status noted ROS: Per HPI  Objective: BP 140/86 mmHg  Pulse 98  Temp(Src) 97.5 F (36.4 C) (Oral)  Ht 5' 10.5" (1.791 m)  Wt 263 lb 12.8 oz (119.659 kg)  BMI 37.30 kg/m2 Gen: NAD, alert, cooperative with exam HEENT: NCAT, TMs WNL BL, Nares clear, oropharynx red but no swollen tonsils Neck: Scattered tender cervical LAD CV: RRR, good S1/S2, no murmur Resp: CTABL, no wheezes, non-labored Ext: No edema, warm Neuro: Alert and oriented, No gross deficits  Assessment and plan:  # viral illness Likely viral upper respiratory infection, considering that he is self-payI will go ahead and  Amoxicillin which he can start if he has worsening symptoms over the next 3-4 days. Cough syrup with codeine for nighttime cough Supportive care, RTC if any worsening symptoms    Orders Placed This Encounter  Procedures  . POCT Influenza A/B  . POCT rapid strep A    Murtis Sink, MD [facility] 09/05/2015, 12:46 PM

## 2016-05-19 ENCOUNTER — Encounter: Payer: Self-pay | Admitting: Family Medicine

## 2016-05-19 ENCOUNTER — Ambulatory Visit (INDEPENDENT_AMBULATORY_CARE_PROVIDER_SITE_OTHER): Payer: Managed Care, Other (non HMO) | Admitting: Family Medicine

## 2016-05-19 ENCOUNTER — Ambulatory Visit (INDEPENDENT_AMBULATORY_CARE_PROVIDER_SITE_OTHER): Payer: Managed Care, Other (non HMO)

## 2016-05-19 VITALS — BP 133/79 | HR 78 | Temp 97.3°F | Ht 70.5 in | Wt 284.0 lb

## 2016-05-19 DIAGNOSIS — M79672 Pain in left foot: Secondary | ICD-10-CM

## 2016-05-19 IMAGING — DX DG FOOT COMPLETE 3+V*L*
3 series · 3 of 3 positions shown · non-contrast
Comparison: Lateral view of the left ankle which included the
calcaneus dated [DATE].

CLINICAL DATA: Left heel pain.  History of injury.

EXAM:
LEFT FOOT - COMPLETE 3+ VIEW

[foot ap]
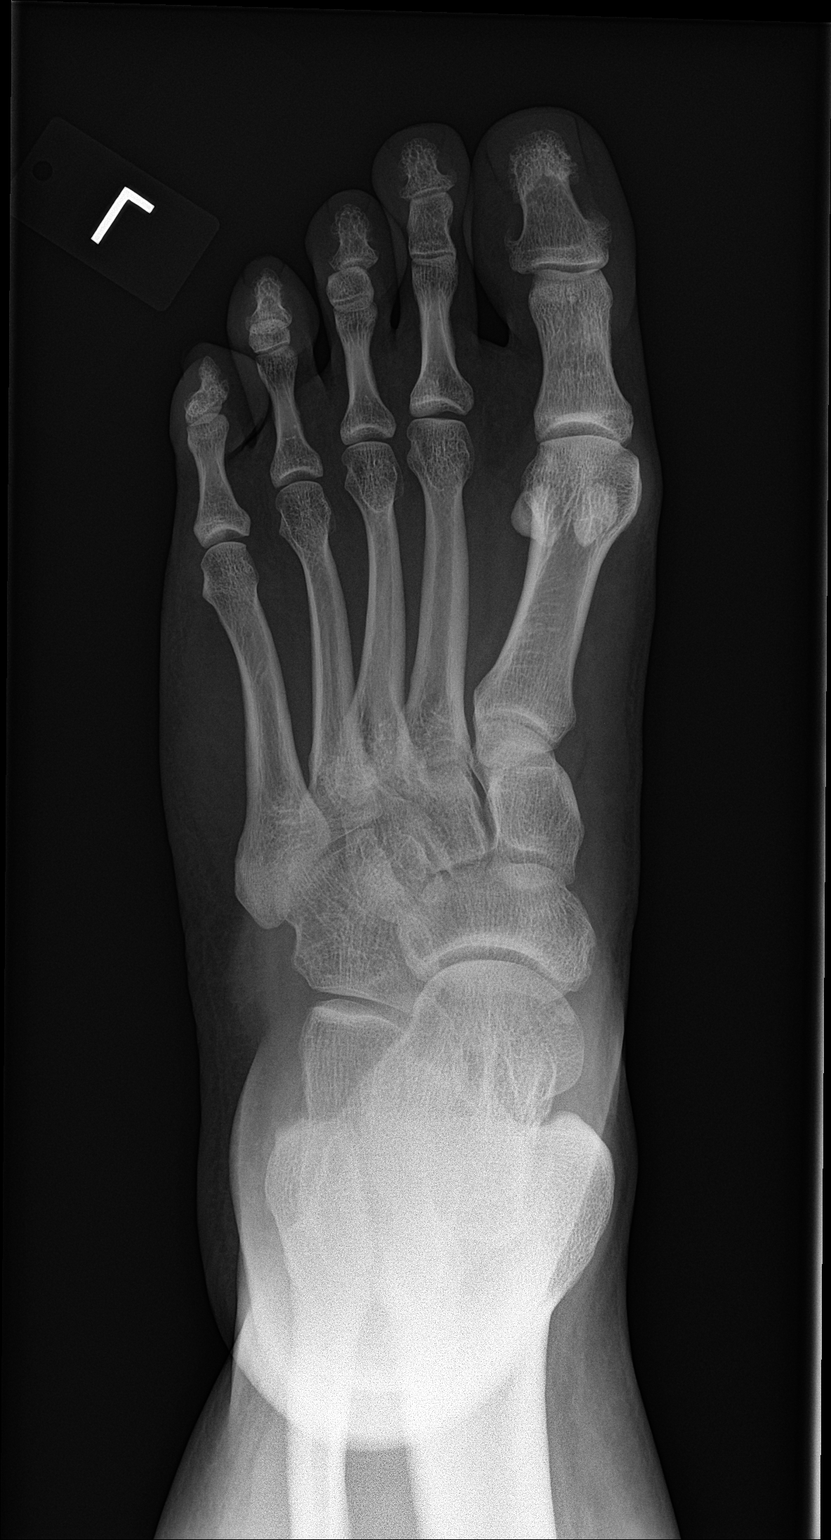

[foot obl]
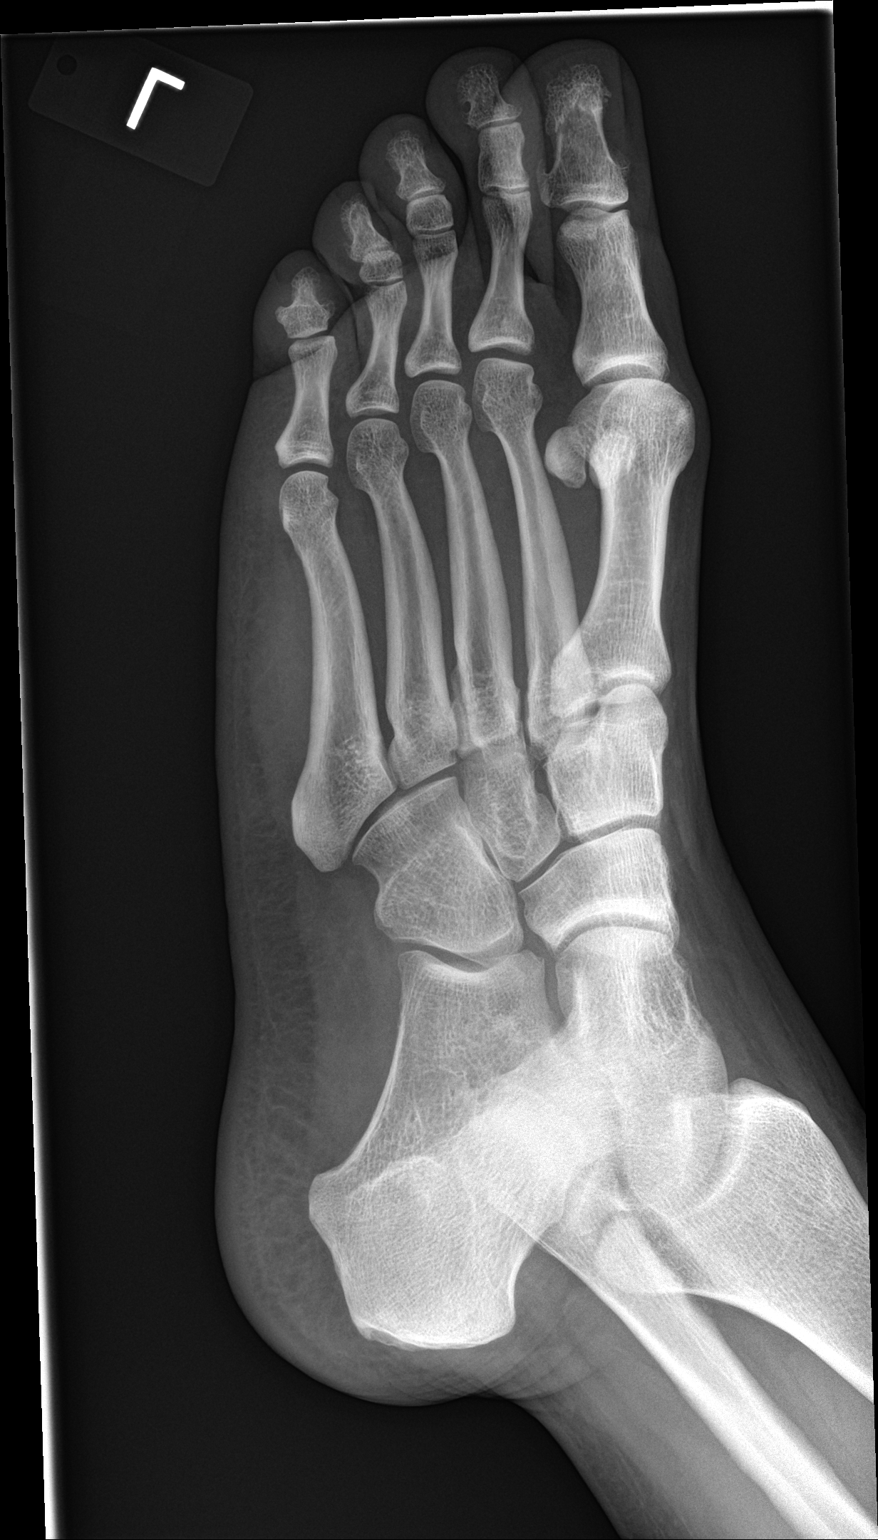

[foot lat]
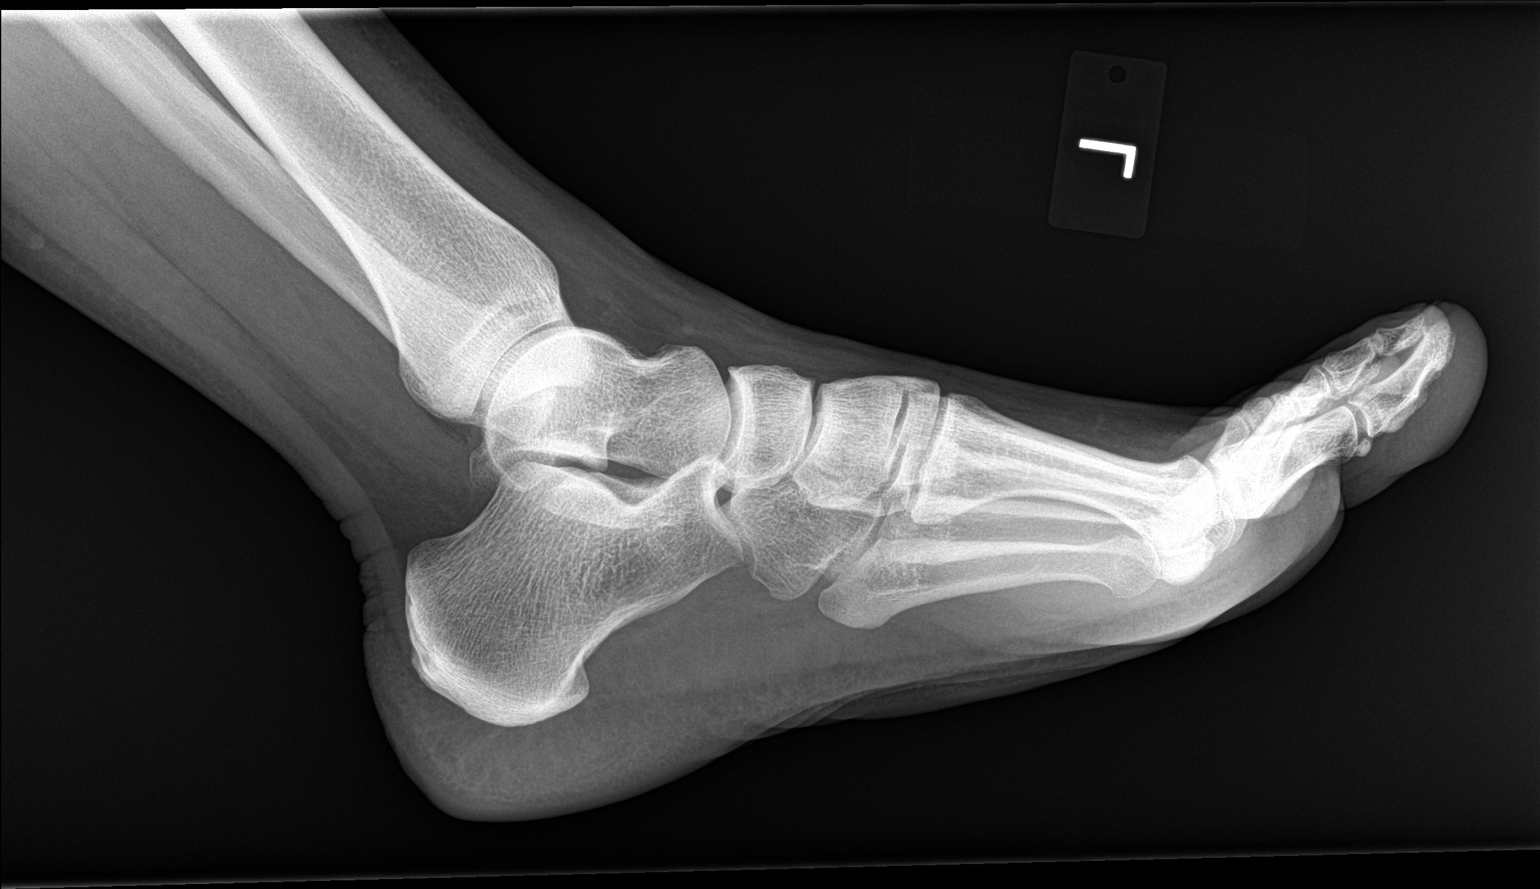

[3 of 3 positions shown; findings below may reference images not displayed]

FINDINGS: The bones of the foot are adequately mineralized. The calcaneus
appears intact. No plantar calcaneal spurring is observed there is a
tiny stable Achilles region spur. The other tarsal bones are intact
as are the metatarsals and phalanges. The joint spaces are
preserved. The soft tissues over the heel exhibit no abnormal
densities and do not appear abnormally swollen.
IMPRESSION: There is no acute bony abnormality of the calcaneus or elsewhere of
the left foot.

## 2016-05-19 NOTE — Progress Notes (Signed)
   Subjective:    Patient ID: Brandon Dominguez, male    DOB: 1986-02-08, 30 y.o.   MRN: [id number]  HPI  patient stepped out of a car yesterday the pain in his left heel radiating up his leg. He was concerned that he may have injured or damage the Achilles tendon. Pain is not occur on the medial or lateral side of the ankle  Patient Active Problem List   Diagnosis Date Noted  . Allergic rhinitis 05/31/2014  . Asthma, mild intermittent 05/31/2014  . Sinus tachycardia 08/03/2013  . Abdominal pain, acute, left lower quadrant 08/03/2013  . Acute diverticulitis of intestine 08/03/2013  . Nausea and vomiting in adult 08/03/2013  . Diverticulitis 08/03/2013  . Diverticulitis of colon 08/03/2013  . Malrotation of intestine 08/03/2013   Outpatient Encounter Prescriptions as of 05/19/2016  Medication Sig  . [DISCONTINUED] albuterol (PROVENTIL HFA;VENTOLIN HFA) 108 (90 BASE) MCG/ACT inhaler Inhale 2 puffs into the lungs every 6 (six) hours as needed for wheezing or shortness of breath.  . [DISCONTINUED] amoxicillin (AMOXIL) 875 MG tablet Take 1 tablet (875 mg total) by mouth 2 (two) times daily.  . [DISCONTINUED] docusate sodium (COLACE) 100 MG capsule Take 100 mg by mouth 2 (two) times daily.  . [DISCONTINUED] fluticasone (FLONASE) 50 MCG/ACT nasal spray Place 2 sprays into both nostrils daily.  . [DISCONTINUED] guaiFENesin-codeine 100-10 MG/5ML syrup Take 5 mLs by mouth 3 (three) times daily as needed for cough.  . [DISCONTINUED] ondansetron (ZOFRAN) 8 MG tablet Take 1 tablet (8 mg total) by mouth every 8 (eight) hours as needed for nausea or vomiting.   No facility-administered encounter medications on file as of 05/19/2016.       Review of Systems  Musculoskeletal: Negative.        Pain in left heel       Objective:   Physical Exam  Musculoskeletal:  Left foot: There is no swelling that is perceptible. Achilles tendon is minimally tender. Squeezing the calf produces normal dorsiflexion of  the foot indicating intact Achilles mechanism   BP 133/79 (BP Location: Left Arm, Patient Position: Sitting, Cuff Size: Large)   Pulse 78   Temp 97.3 F (36.3 C) (Oral)   Ht 5' 10.5" (1.791 m)   Wt 284 lb (128.8 kg)   BMI 40.17 kg/m        Assessment & Plan:  1. Pain of left heel X-ray is negative. Assume sprain and strain of Achilles tendon. Activity as tolerated and take Tylenol or ibuprofen. - DG Foot Complete Left; Future  Frederica KusterStephen M Miller MD

## 2016-07-23 ENCOUNTER — Telehealth: Payer: Self-pay | Admitting: Internal Medicine

## 2016-07-23 NOTE — Telephone Encounter (Signed)
Set him up for contrast CT abdomen and pelvis "acute abdominal pain, r/o diverticulitis" ASAP and prescribe an additional week of cipro and flagyl. He needs OV with me or APP next week. Thanks

## 2016-07-23 NOTE — Telephone Encounter (Signed)
Pt states he spoke with the on call doc several days ago and was having LLQ abdominal pain. Pt has h/o diverticulitis. He was prescribed Cipro and Flagyl for 10days. Pt states he finished the meds yesterday and today the pain is back in his LLQ. Reports he does not have a fever as of yet. Pt states the prescribing physician was Dr. Elnoria HowardHung. Please advise.

## 2016-07-24 ENCOUNTER — Other Ambulatory Visit: Payer: Self-pay

## 2016-07-24 DIAGNOSIS — R1032 Left lower quadrant pain: Secondary | ICD-10-CM

## 2016-07-24 MED ORDER — CIPROFLOXACIN HCL 500 MG PO TABS: 500.0000 mg | ORAL_TABLET | Freq: Two times a day (BID) | ORAL | 0 refills | Status: DC

## 2016-07-24 MED ORDER — METRONIDAZOLE 500 MG PO TABS: 500.0000 mg | ORAL_TABLET | Freq: Two times a day (BID) | ORAL | 0 refills | Status: DC

## 2016-07-24 NOTE — Telephone Encounter (Signed)
Left message for pt to call back. Scripts sent to pharmacy. Pt scheduled to see Dr. Marina GoodellPerry 07/28/16@2 :15pm.

## 2016-07-24 NOTE — Telephone Encounter (Signed)
OV cancelled °

## 2016-07-24 NOTE — Telephone Encounter (Signed)
Called pt and he states he did not get my voicemail this morning. States he got up early this morning and he is better, no longer has any pain. Pt did not want to schedule CT scan and states he will call back to schedule an OV, he states he will have to look at his schedule and call us back. Dr. Marina GoodellPerry notified.

## 2016-07-24 NOTE — Telephone Encounter (Signed)
If he is feeling fine, no appointment necessary. Follow-up as needed

## 2016-07-28 ENCOUNTER — Ambulatory Visit: Payer: Self-pay | Admitting: Internal Medicine

## 2016-11-19 ENCOUNTER — Encounter: Payer: Self-pay | Admitting: Family Medicine

## 2016-11-19 ENCOUNTER — Telehealth: Payer: Self-pay | Admitting: Family

## 2016-11-19 ENCOUNTER — Ambulatory Visit (INDEPENDENT_AMBULATORY_CARE_PROVIDER_SITE_OTHER): Payer: Managed Care, Other (non HMO) | Admitting: Family Medicine

## 2016-11-19 VITALS — BP 135/82 | HR 119 | Temp 100.3°F | Ht 70.5 in | Wt 276.0 lb

## 2016-11-19 DIAGNOSIS — K5732 Diverticulitis of large intestine without perforation or abscess without bleeding: Secondary | ICD-10-CM

## 2016-11-19 MED ORDER — METRONIDAZOLE 500 MG PO TABS: 500.0000 mg | ORAL_TABLET | Freq: Two times a day (BID) | ORAL | 0 refills | Status: DC

## 2016-11-19 MED ORDER — CIPROFLOXACIN HCL 500 MG PO TABS: 500.0000 mg | ORAL_TABLET | Freq: Two times a day (BID) | ORAL | 0 refills | Status: DC

## 2016-11-19 NOTE — Progress Notes (Signed)
BP 135/82   Pulse (!) 119   Temp 100.3 F (37.9 C) (Oral)   Ht 5' 10.5" (1.791 m)   Wt 276 lb (125.2 kg)   BMI 39.04 kg/m    Subjective:    Patient ID: Brandon Dominguez, male    DOB: 1986-01-16, 31 y.o.   MRN: [id number]  HPI: Brandon Dominguez is a 31 y.o. male presenting on 11/19/2016 for Abdominal Pain (x 2 days, began as dull ache became much more severe yesterday; history of diverticulitis) and Fever (100.3)   Abdominal Pain  This is a new problem. Episode onset: Yesterday at 5pm. The problem occurs constantly. The problem has been unchanged. The pain is located in the LLQ. The pain is at a severity of 7/10. The quality of the pain is cramping and sharp. The abdominal pain does not radiate. Associated symptoms include a fever. Pertinent negatives include no arthralgias, constipation, diarrhea, dysuria, hematochezia, hematuria, melena, myalgias, nausea, vomiting or weight loss. The pain is aggravated by palpation. Treatments tried: Tylenol and Benydryl. The treatment provided mild relief. Patient was diagnosed with Diverticulitis in 2015, patient states the he also had a flare up last year. Patient states that this occurence feels similar to previous episodes of diverticulitis.    Relevant past medical, surgical, family and social history reviewed and updated as indicated. Interim medical history since our last visit reviewed. Allergies and medications reviewed and updated.  Review of Systems  Constitutional: Positive for fever. Negative for appetite change, chills, diaphoresis, fatigue, unexpected weight change and weight loss.  HENT: Negative.  Negative for congestion, ear pain, postnasal drip, sore throat and trouble swallowing.   Eyes: Negative.  Negative for pain.  Respiratory: Negative.  Negative for cough, chest tightness, shortness of breath and wheezing.   Cardiovascular: Negative.  Negative for chest pain.  Gastrointestinal: Positive for abdominal pain. Negative for blood in stool,  constipation, diarrhea, hematochezia, melena, nausea and vomiting.  Endocrine: Negative.   Genitourinary: Negative.  Negative for difficulty urinating, dysuria and hematuria.  Musculoskeletal: Negative.  Negative for arthralgias, myalgias and neck pain.  Skin: Negative.  Negative for rash.  Allergic/Immunologic: Negative.   Neurological: Negative.  Negative for dizziness, syncope, weakness and light-headedness.    Per HPI unless specifically indicated above      Objective:    BP 135/82   Pulse (!) 119   Temp 100.3 F (37.9 C) (Oral)   Ht 5' 10.5" (1.791 m)   Wt 276 lb (125.2 kg)   BMI 39.04 kg/m   Wt Readings from Last 3 Encounters:  11/19/16 276 lb (125.2 kg)  05/19/16 284 lb (128.8 kg)  09/05/15 263 lb 12.8 oz (119.7 kg)    Physical Exam  Constitutional: He is oriented to person, place, and time. He appears well-developed and well-nourished. No distress.  HENT:  Head: Normocephalic and atraumatic.  Right Ear: External ear normal.  Left Ear: External ear normal.  Eyes: Conjunctivae and EOM are normal. Pupils are equal, round, and reactive to light. Right eye exhibits no discharge. Left eye exhibits no discharge.  Neck: Normal range of motion. Neck supple.  Cardiovascular: Normal rate, regular rhythm, normal heart sounds and intact distal pulses.  Exam reveals no gallop and no friction rub.   No murmur heard. Pulmonary/Chest: Effort normal and breath sounds normal. No respiratory distress. He has no wheezes.  Abdominal: Soft. Normal appearance and bowel sounds are normal. He exhibits no distension and no mass. There is tenderness in the left lower quadrant. There  is no rebound, no guarding, no CVA tenderness, no tenderness at McBurney's point and negative Murphy's sign.  Musculoskeletal: Normal range of motion.  Neurological: He is alert and oriented to person, place, and time. No cranial nerve deficit.  Skin: Skin is warm and dry. He is not diaphoretic.  Psychiatric: He  has a normal mood and affect. His behavior is normal.      Assessment & Plan:   Problem List Items Addressed This Visit      Digestive   Diverticulitis of colon - Primary   Relevant Medications   ciprofloxacin (CIPRO) 500 MG tablet   metroNIDAZOLE (FLAGYL) 500 MG tablet     Patient seen and examined with Almira CoasterBrandon Marelli PA student. Agree with assessment and plan above. We'll treat as outpatient diverticulitis but instructed patient that if it worsens he needs to go to the emergency department. Gave the patient information for [email or web address] because he doesn't have insurance so he can make sure he can afford his medications. Arville CareJoshua Jaymarie Yeakel, MD [facility] 11/19/2016, 4:15 PM  Follow up plan: Return if symptoms worsen or fail to improve.  Counseling provided for all of the vaccine components No orders of the defined types were placed in this encounter.

## 2016-11-19 NOTE — Telephone Encounter (Signed)
Pt needs to be seen

## 2016-11-19 NOTE — Telephone Encounter (Signed)
Spoke to pt regarding request for medication Informed pt that we have not treated pt for this problem since 2015 and he will need to come in for evaluation

## 2016-11-19 NOTE — Telephone Encounter (Signed)
What symptoms do you have? Diverticulitis flare up pain in lower left abdomin.  How long have you been sick? Yesterday afternoon  Have you been seen for this problem? yes  If your provider decides to give you a prescription, which pharmacy would you like for it to be sent to? [facility] metroNIDAZOLE (FLAGYL) 500 MG tablet and ciprofloxacin (CIPRO) 500 MG tablet   Patient informed that this information will be sent to the clinical staff for review and that they should receive a follow up call.

## 2016-11-19 NOTE — Telephone Encounter (Signed)
lmtcb

## 2016-12-04 NOTE — Telephone Encounter (Signed)
Patient has been seen since phone call.  This encounter will now be closed 

## 2017-03-01 ENCOUNTER — Ambulatory Visit (INDEPENDENT_AMBULATORY_CARE_PROVIDER_SITE_OTHER): Payer: BLUE CROSS/BLUE SHIELD

## 2017-03-01 ENCOUNTER — Ambulatory Visit (INDEPENDENT_AMBULATORY_CARE_PROVIDER_SITE_OTHER): Payer: BLUE CROSS/BLUE SHIELD | Admitting: Physician Assistant

## 2017-03-01 ENCOUNTER — Encounter: Payer: Self-pay | Admitting: Physician Assistant

## 2017-03-01 VITALS — BP 132/84 | HR 87 | Temp 98.2°F | Resp 16 | Ht 71.26 in | Wt 278.2 lb

## 2017-03-01 DIAGNOSIS — M7581 Other shoulder lesions, right shoulder: Secondary | ICD-10-CM | POA: Diagnosis not present

## 2017-03-01 DIAGNOSIS — G8929 Other chronic pain: Secondary | ICD-10-CM

## 2017-03-01 DIAGNOSIS — M542 Cervicalgia: Secondary | ICD-10-CM | POA: Diagnosis not present

## 2017-03-01 DIAGNOSIS — M25511 Pain in right shoulder: Secondary | ICD-10-CM | POA: Diagnosis not present

## 2017-03-01 DIAGNOSIS — M25512 Pain in left shoulder: Secondary | ICD-10-CM

## 2017-03-01 DIAGNOSIS — K429 Umbilical hernia without obstruction or gangrene: Secondary | ICD-10-CM | POA: Diagnosis not present

## 2017-03-01 DIAGNOSIS — Z6838 Body mass index (BMI) 38.0-38.9, adult: Secondary | ICD-10-CM

## 2017-03-01 IMAGING — DX DG CERVICAL SPINE 2 OR 3 VIEWS
4 series · 4 of 4 positions shown · non-contrast
Comparison: None.

CLINICAL DATA: Chronic neck pain

EXAM:
CERVICAL SPINE - 2-3 VIEW

[c-spine lat]
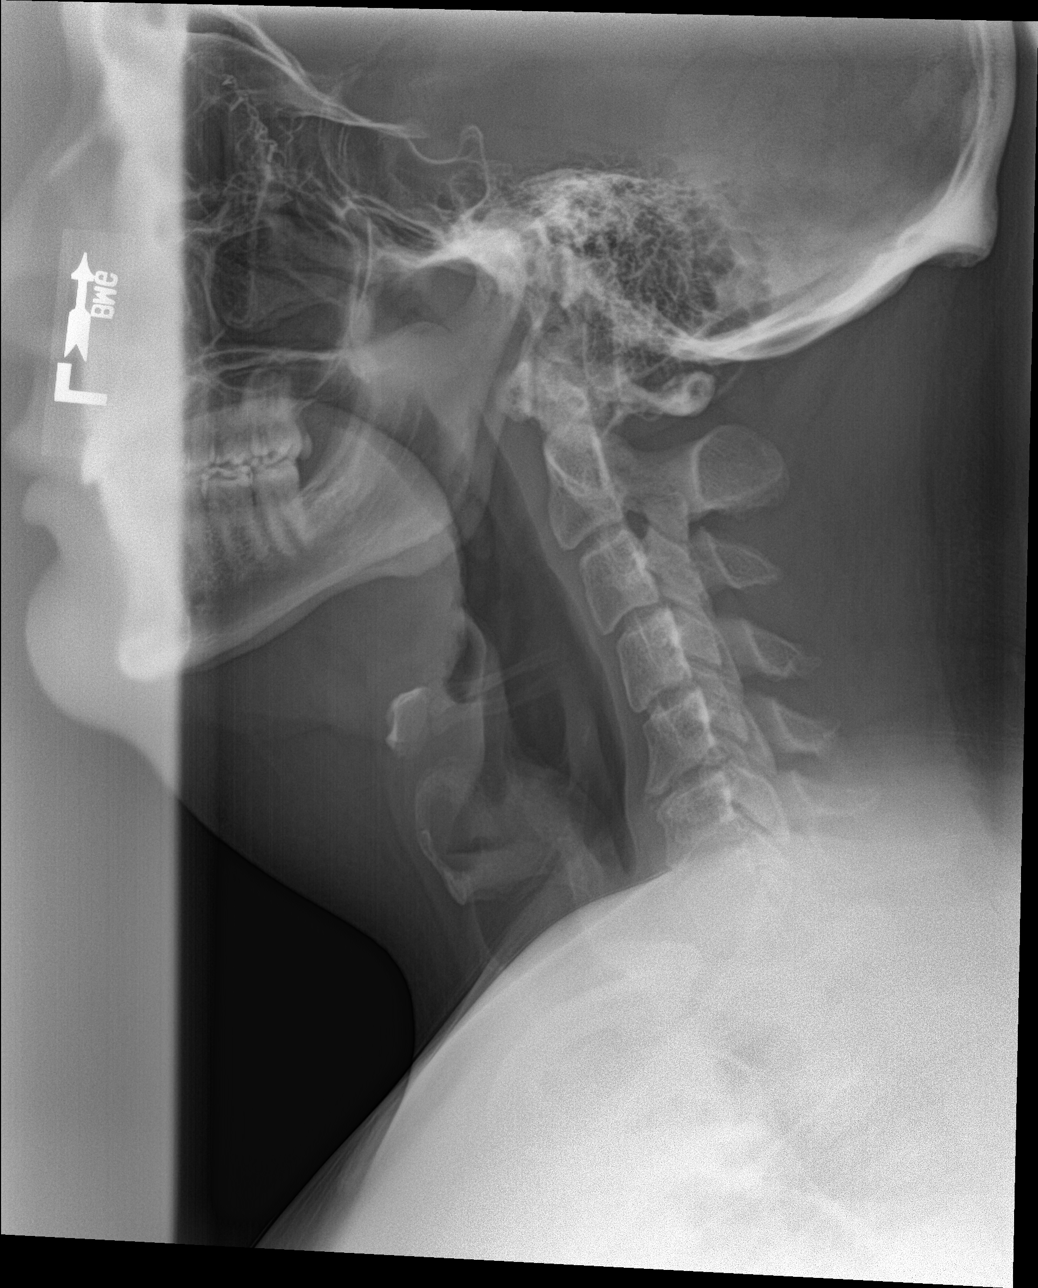

[c-spine ap]
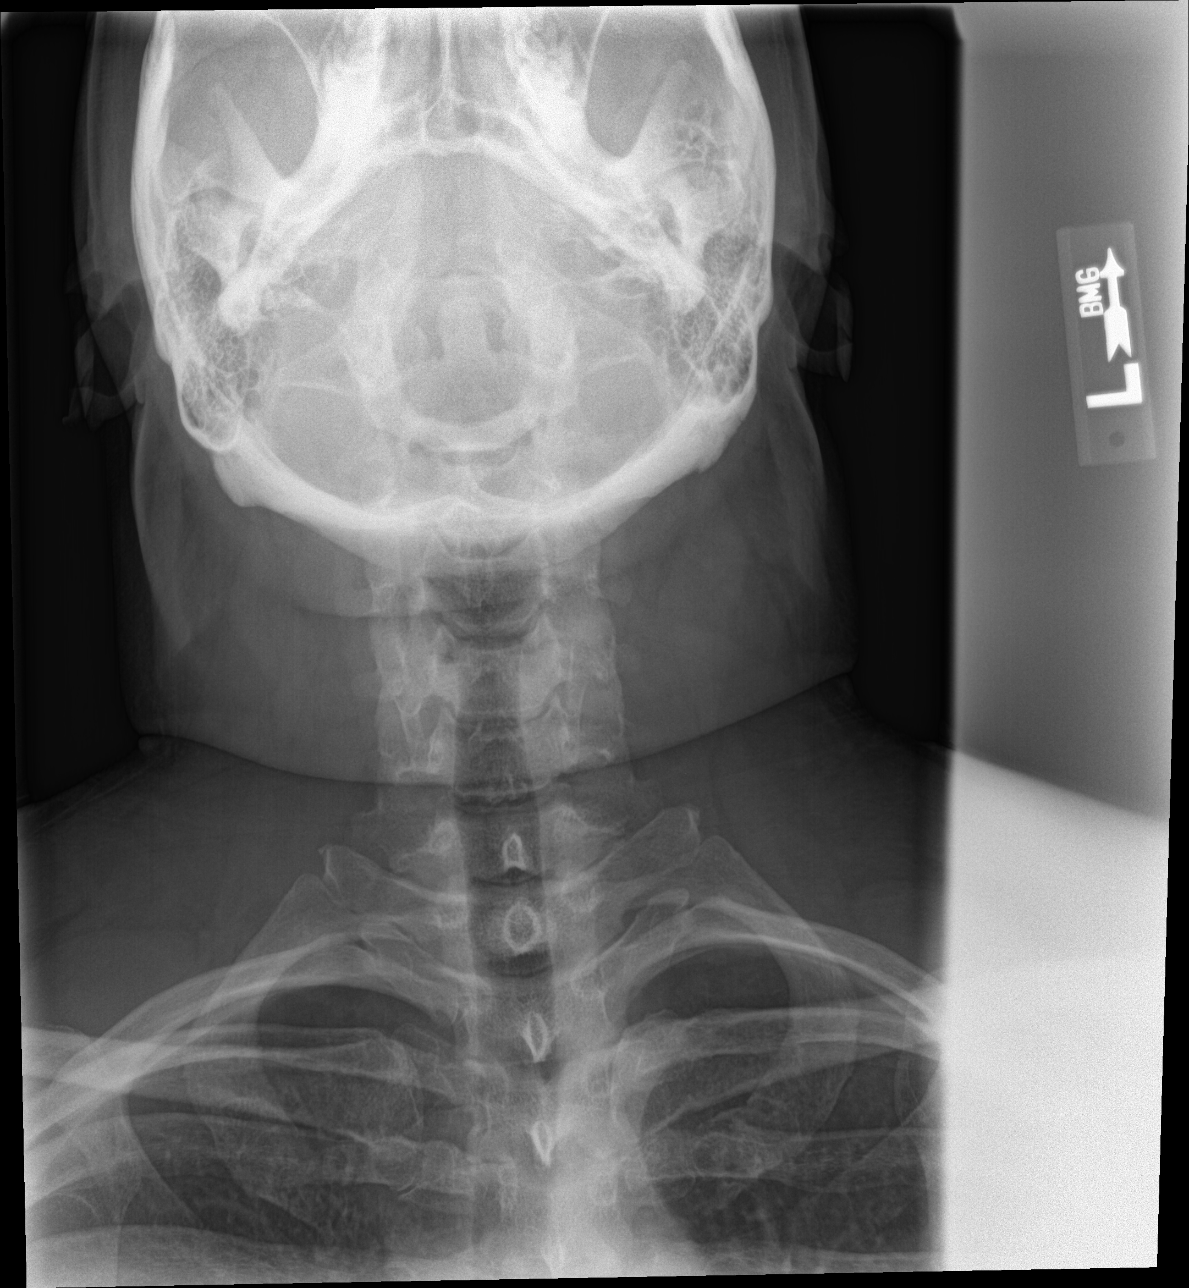

[c-spine open mouth]
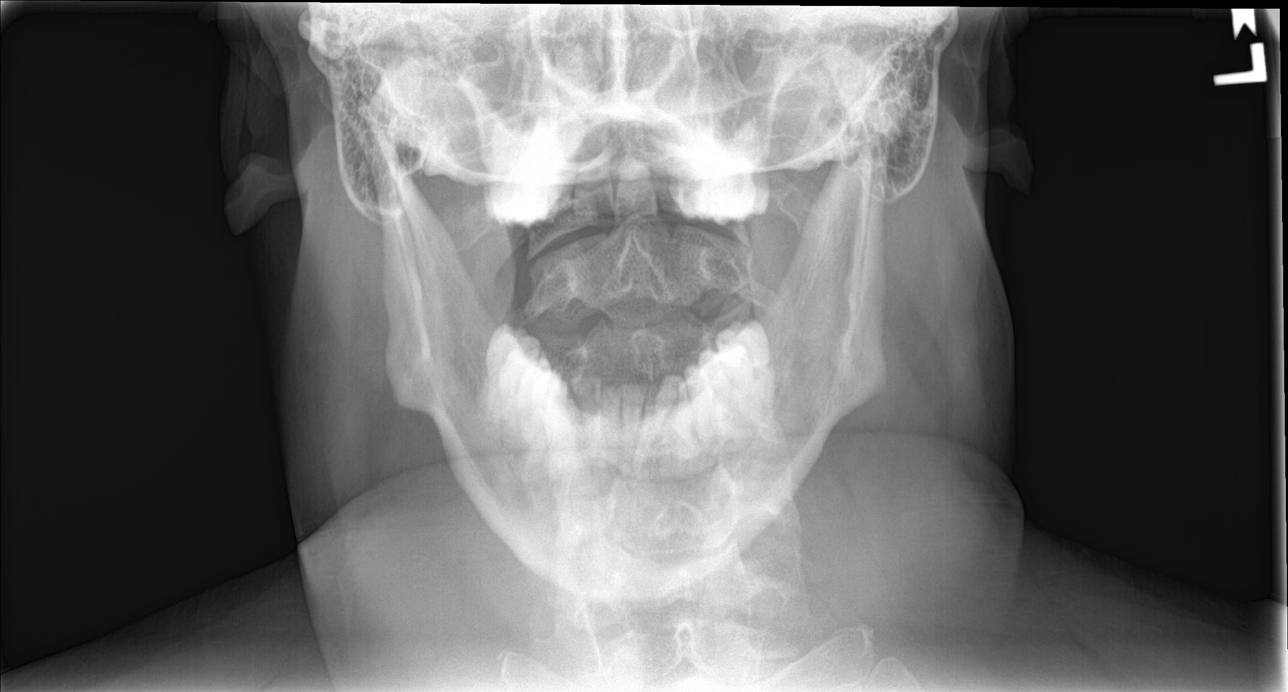

[c-spine swimmers]
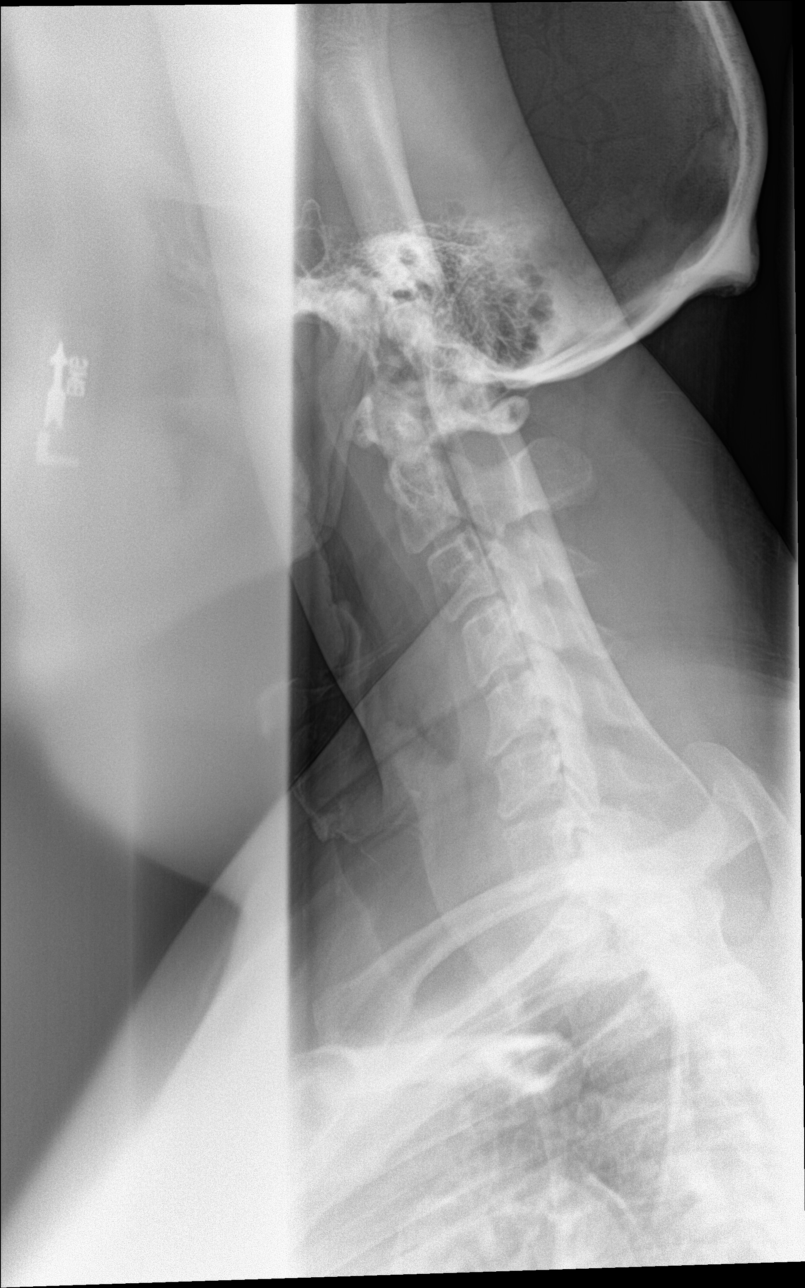

[4 of 4 positions shown; findings below may reference images not displayed]

FINDINGS: Early degenerative disc disease at C5-6 with disc space narrowing
and early spurring. Normal alignment. No fracture. Prevertebral soft
tissues are normal.
IMPRESSION: No acute bony abnormality.  Early degenerative changes at C5-6.

## 2017-03-01 NOTE — Patient Instructions (Addendum)
  You may enjoy reading "In Defense of Food" by Cristal FordMichael Pollan.    IF you received an x-ray today, you will receive an invoice from [facility]. Please contact [facility] at [phone] with questions or concerns regarding your invoice.   IF you received labwork today, you will receive an invoice from [facility]. Please contact [facility] at [phone] with questions or concerns regarding your invoice.   Our billing staff will not be able to assist you with questions regarding bills from these companies.  You will be contacted with the lab results as soon as they are available. The fastest way to get your results is to activate your My Chart account. Instructions are located on the last page of this paperwork. If you have not heard from us regarding the results in 2 weeks, please contact this office.

## 2017-03-01 NOTE — Progress Notes (Signed)
03/01/2017 12:06 PM   DOB: 1985/09/28 / MRN: [id number]  SUBJECTIVE:  Brandon Dominguez is a 31 y.o. male presenting for multiple complaints.    Has neck and shoulder pain. This has been present for about 2 to 3 years and is worsening.  He sits for work and does not engage in a structured exercise program.  Also states that his neck will pop. Has tried 400 mg Advil with mild relief.   Has an "abdominal hernia" that he noticed about 8 months ago that started after a LRTI.  Denies pain with palpation of the area and this is reducible.   Tells me his hands crack and his left wrist hurts. Also tells me that his elbow will lock at roughly 45 degrees of flexion and supination.   Complains of discoloration of his left anterior leg that started 5-6 years ago due to flea bites.  He has No Known Allergies.   He  has a past medical history of Allergy; Asthma; Diverticulitis; Diverticulitis; Diverticulosis; Hepatic steatosis; Malrotation of intestine; and Medical history non-contributory.    He  reports that he has never smoked. He has never used smokeless tobacco. He reports that he drinks alcohol. He reports that he does not use drugs. He  has no sexual activity history on file. The patient  has a past surgical history that includes cranial surgery and Eye surgery.  His family history includes Colon polyps in his mother; Diabetes in his paternal grandfather and paternal grandmother; Diverticulitis in his mother; Heart disease in his paternal aunt, paternal grandfather, and paternal uncle.  Review of Systems  Constitutional: Negative for chills, diaphoresis and fever.  Respiratory: Negative for cough, hemoptysis, sputum production, shortness of breath and wheezing.   Cardiovascular: Negative for chest pain.  Gastrointestinal: Negative for abdominal pain, blood in stool, constipation, diarrhea, heartburn, melena, nausea and vomiting.  Genitourinary: Negative for dysuria, flank pain, frequency, hematuria  and urgency.  Skin: Negative for rash.  Neurological: Negative for dizziness.    The problem list and medications were reviewed and updated by myself where necessary and exist elsewhere in the encounter.   OBJECTIVE:  BP 132/84 (BP Location: Right Arm, Patient Position: Sitting, Cuff Size: Large)   Pulse 87   Temp 98.2 F (36.8 C) (Oral)   Resp 16   Ht 5' 11.26" (1.81 m)   Wt 278 lb 3.2 oz (126.2 kg)   SpO2 98%   BMI 38.52 kg/m   Physical Exam  Constitutional: He is oriented to person, place, and time. He appears well-developed. He is active and cooperative.  Non-toxic appearance.  Eyes: Pupils are equal, round, and reactive to light. EOM are normal.  Cardiovascular: Normal rate, regular rhythm, S1 normal, S2 normal, normal heart sounds, intact distal pulses and normal pulses.  Exam reveals no gallop and no friction rub.   No murmur heard. Pulmonary/Chest: Effort normal. No stridor. No tachypnea. No respiratory distress. He has no wheezes. He has no rales.  Abdominal: Soft. Normal appearance and bowel sounds are normal. He exhibits mass (perumbilical hernia, reducible). He exhibits no distension. There is no tenderness. There is no rigidity, no rebound, no guarding and no CVA tenderness. No hernia.  Musculoskeletal: Normal range of motion. He exhibits no edema, tenderness or deformity.  Mild kyphosis about the cervicothoracic spine. Mild positive Hawkins-Kennedy about the right shoulder however no weakness.  Neurological: He is alert and oriented to person, place, and time. He has normal strength and normal reflexes. He is not  disoriented. No cranial nerve deficit or sensory deficit. He exhibits normal muscle tone. Coordination and gait normal.  Skin: Skin is warm and dry. He is not diaphoretic. No pallor.  Psychiatric: His behavior is normal.  Vitals reviewed.   No results found for this or any previous visit (from the past 72 hour(s)).  Dg Cervical Spine 2 Or 3 Views  Result  Date: 03/01/2017 CLINICAL DATA:  Chronic neck pain EXAM: CERVICAL SPINE - 2-3 VIEW COMPARISON:  None. FINDINGS: Early degenerative disc disease at C5-6 with disc space narrowing and early spurring. Normal alignment. No fracture. Prevertebral soft tissues are normal. IMPRESSION: No acute bony abnormality.  Early degenerative changes at C5-6. Electronically Signed   By: Charlett Nose M.D.   On: 03/01/2017 11:36    ASSESSMENT AND PLAN:  [name] was seen today for personal problem.  Diagnoses and all orders for this visit:  Chronic neck pain: He works at a desk chronically. He would benefit from PT and exercise.  Advised that I see him back in 3 months and would like to see his weight down about 10 lbs by that time.  -     Ambulatory referral to Physical Therapy  Chronic pain of both shoulders -     Sedimentation Rate -     C-reactive protein -     Ambulatory referral to Physical Therapy  BMI 38.0-38.9,adult -     Hemoglobin A1c -     CBC -     TSH -     CMP and Liver  Rotator cuff tendinitis right: Most likely 2/2 to problem 1.   Umbilical hernia without obstruction and without gangrene: Hernia is reproducible but is quite large and he tells me it is getting worse. Will go ahead and get an image to further quantify.  -     US Abdomen Limited; Future       The patient is advised to call or return to clinic if he does not see an improvement in symptoms, or to seek the care of the closest emergency department if he worsens with the above plan.   Deliah Boston, MHS, PA-C Primary Care at [facility] 03/01/2017 12:06 PM

## 2017-03-02 LAB — CMP AND LIVER
ALBUMIN: 4.6 g/dL (ref 3.5–5.5)
ALK PHOS: 74 IU/L (ref 39–117)
ALT: 33 IU/L (ref 0–44)
AST: 26 IU/L (ref 0–40)
BILIRUBIN TOTAL: 0.4 mg/dL (ref 0.0–1.2)
BUN: 13 mg/dL (ref 6–20)
Bilirubin, Direct: 0.1 mg/dL (ref 0.00–0.40)
CHLORIDE: 105 mmol/L (ref 96–106)
CO2: 22 mmol/L (ref 20–29)
Calcium: 9.1 mg/dL (ref 8.7–10.2)
Creatinine, Ser: 0.75 mg/dL — ABNORMAL LOW (ref 0.76–1.27)
GFR calc Af Amer: 139 mL/min/{1.73_m2} (ref >59)
GFR, EST NON AFRICAN AMERICAN: 121 mL/min/{1.73_m2} (ref >59)
GLUCOSE: 83 mg/dL (ref 65–99)
POTASSIUM: 4.1 mmol/L (ref 3.5–5.2)
Sodium: 143 mmol/L (ref 134–144)
Total Protein: 7.3 g/dL (ref 6.0–8.5)

## 2017-03-02 LAB — CBC
HEMATOCRIT: 44.2 % (ref 37.5–51.0)
HEMOGLOBIN: 15 g/dL (ref 13.0–17.7)
MCH: 30.9 pg (ref 26.6–33.0)
MCHC: 33.9 g/dL (ref 31.5–35.7)
MCV: 91 fL (ref 79–97)
Platelets: 268 10*3/uL (ref 150–379)
RBC: 4.86 x10E6/uL (ref 4.14–5.80)
RDW: 13.6 % (ref 12.3–15.4)
WBC: 7.8 10*3/uL (ref 3.4–10.8)

## 2017-03-02 LAB — HEMOGLOBIN A1C
Est. average glucose Bld gHb Est-mCnc: 114 mg/dL
Hgb A1c MFr Bld: 5.6 % (ref 4.8–5.6)

## 2017-03-02 LAB — SEDIMENTATION RATE: Sed Rate: 13 mm/hr (ref 0–15)

## 2017-03-02 LAB — TSH: TSH: 3.28 u[IU]/mL (ref 0.450–4.500)

## 2017-03-02 LAB — C-REACTIVE PROTEIN: CRP: 9.5 mg/L — ABNORMAL HIGH (ref 0.0–4.9)

## 2017-03-12 ENCOUNTER — Ambulatory Visit: Payer: BLUE CROSS/BLUE SHIELD | Attending: Physician Assistant | Admitting: Physical Therapy

## 2017-03-12 DIAGNOSIS — M25511 Pain in right shoulder: Secondary | ICD-10-CM | POA: Diagnosis present

## 2017-03-12 DIAGNOSIS — M62838 Other muscle spasm: Secondary | ICD-10-CM

## 2017-03-12 DIAGNOSIS — M542 Cervicalgia: Secondary | ICD-10-CM | POA: Insufficient documentation

## 2017-03-12 DIAGNOSIS — G8929 Other chronic pain: Secondary | ICD-10-CM | POA: Insufficient documentation

## 2017-03-15 ENCOUNTER — Encounter: Payer: Self-pay | Admitting: Physical Therapy

## 2017-03-15 NOTE — Therapy (Addendum)
[place], [place], [address] Phone: [phone]   Fax:  [phone]  Physical Therapy Evaluation/ Discharge  Patient Details  Name: Brandon Dominguez MRN: [id number] Date of Birth: 1986-02-07 Referring Provider: Philis Fendt PA   Encounter Date: 03/12/2017      PT End of Session - 03/15/17 0906    Visit Number 1   Number of Visits 12   Date for PT Re-Evaluation 04/26/17   Authorization Type [employer]    PT Start Time 0807   PT Stop Time 0845   PT Time Calculation (min) 38 min   Activity Tolerance Patient tolerated treatment well   Behavior During Therapy [facility] for tasks assessed/performed      Past Medical History:  Diagnosis Date  . Allergy   . Asthma   . Diverticulitis   . Diverticulitis   . Diverticulosis   . Hepatic steatosis   . Malrotation of intestine   . Medical history non-contributory     Past Surgical History:  Procedure Laterality Date  . cranial surgery     eye socket fracture  . EYE SURGERY     Put plate in    There were no vitals filed for this visit.       Subjective Assessment - 03/15/17 0902    Subjective Patient began having neck and shoiulder pain over the last six months. He feels like when he sleeps his arm is up over his head. he has a Dance movement psychotherapist. As he sleeps his arm stretches. He also ends up at night with his head against the backboard of the bed. His neck pain is constant. His shoulder pain comes and goes    Limitations House hold activities;Lifting   Currently in Pain? Yes   Pain Score 4    Pain Location Neck   Pain Orientation Lower   Pain Descriptors / Indicators Aching   Pain Type Chronic pain   Pain Onset More than a month ago   Pain Frequency Constant   Aggravating Factors  sleeping, work    Pain Relieving Factors rest, cracking his neck    Effect of Pain on Daily Activities pain throughout the day    Multiple Pain Sites Yes   Pain Score 8  Pain  when sleeping    Pain Location Shoulder   Pain Orientation Right   Pain Descriptors / Indicators Aching   Pain Type Chronic pain   Pain Radiating Towards inot right hand    Pain Onset More than a month ago   Aggravating Factors  sleeping with arm overhead    Pain Relieving Factors Not sleeping with arm overhead             [facility] PT Assessment - 03/15/17 0001      Assessment   Medical Diagnosis cervical pain/ Right shoulder pain    Referring Provider Philis Fendt PA    Onset Date/Surgical Date --  > 6 months prior    Hand Dominance Right   Next MD Visit 3 months    Prior Therapy None for neck or shoulder      Precautions   Precautions None     Restrictions   Weight Bearing Restrictions No     Balance Screen   Has the patient fallen in the past 6 months No   Has the patient had a decrease in activity level because of a fear of falling?  No   Is the patient reluctant to leave their home  because of a fear of falling?  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    Leisure Nothing      Cognition   Overall Cognitive Status Within Functional Limits for tasks assessed   Attention Focused   Focused Attention Appears intact   Memory Appears intact   Awareness Appears intact   Problem Solving Appears intact     Sensation   Additional Comments Numbness in both arms at night      Coordination   Gross Motor Movements are Fluid and Coordinated Yes   Fine Motor Movements are Fluid and Coordinated Yes     Posture/Postural Control   Posture/Postural Control Postural limitations   Postural Limitations Rounded Shoulders;Forward head     AROM   Cervical Flexion 35   Cervical Extension 28   Cervical - Right Side Bend normal    Cervical - Left Side  Bend normal     Cervical - Right Rotation 60   Cervical - Left Rotation 60     PROM   Overall PROM Comments No pain with end range motion. Full shoulder motion bilateral.      Strength   Right Shoulder Flexion 4+/5   Right Shoulder ABduction 4+/5   Right Shoulder Internal Rotation 5/5   Right Shoulder External Rotation 4+/5   Left Shoulder Flexion 5/5   Left Shoulder ABduction 5/5   Left Shoulder Internal Rotation 5/5   Left Shoulder External Rotation 5/5     Palpation   Palpation comment spasming in bilateral upper traps      Special Tests    Special Tests Laxity/Instability Tests;Biceps/Labral Tests;Rotator Cuff Impingement   Rotator Cuff Impingment tests Michel Bickers test;Empty Can test;Drop Arm test   Biceps/Labral tests Speeds Test     Hawkins-Kennedy test   Comments (+) right      Empty Can test   Comment (-) right      Drop Arm test   Comment (-) right      Speeds test   Comment (-)             Objective measurements completed on examination: See above findings.          [facility] Adult PT Treatment/Exercise - 03/15/17 0001      Neck Exercises: Standing   Other Standing Exercises shoulder extension 2x10 red; scapular retraction 2x10 red      Manual Therapy   Manual therapy comments Soft tissue mobilization to bilateral upper traps; gentle manual traction to cervical spine.      Neck Exercises: Stretches   Upper Trapezius Stretch Limitations 2x30sec hold bilateral    Levator Stretch Limitations 2x30sec bilateral                 PT Education - 03/15/17 0905    Education provided Yes   Education Details rationale behind trigger point release; HEP    Person(s) Educated Patient   Methods Explanation;Demonstration;Tactile cues;Verbal cues   Comprehension Verbalized understanding;Returned demonstration;Tactile cues required;Verbal cues required          PT Short Term Goals - 03/15/17 0915      PT SHORT TERM GOAL #1   Title Patient  will increase bilateral cervical rotation by 10 degrees  Time 4   Period Weeks   Status New     PT SHORT TERM GOAL #2   Title Patient will demsotrate 5/5 gross right upper extremity strength    Time 4   Period Weeks   Status New     PT SHORT TERM GOAL #3   Title Patient will report decreased tenderness to palpation in the upper trap    Time 4   Period Weeks   Status New           PT Long Term Goals - 03/15/17 9741      PT LONG TERM GOAL #1   Title Patient will be independent with strengthening program for posture and shoulder strengthening    Time 8   Period Weeks   Status New     PT LONG TERM GOAL #2   Title Patient will report no pain with any cervical movements in order to work and drive without pain    Time 8   Period Weeks   Status New     PT LONG TERM GOAL #3   Title Patient will sleep through the night without neck and shoulder pain    Time 8   Period Weeks   Status New                Plan - 03/15/17 0907    Clinical Impression Statement Patient is a 31 year old male with cervical spine pain. His pain increases at night. He also has right shoulder pain when he sleeps with his sarm overhead. He has spasming of bilateral upper trapsand restricted cervical motion. He had a positivie impingement test with his right shoulder but negative RTC testing. He would beneft from skilled therapy to improve posture and decrease pain and spasming in the cervical spine.    Clinical Presentation Stable   Clinical Decision Making Low   Rehab Potential Good   PT Frequency 2x / week   PT Duration 8 weeks   PT Treatment/Interventions ADLs/Self Care Home Management;Cryotherapy;Electrical Stimulation;Iontophoresis 54m/ml Dexamethasone;Moist Heat;Traction;Ultrasound;Therapeutic activities;Therapeutic exercise;Neuromuscular re-education;Patient/family education;Passive range of motion;Dry needling;Splinting;Taping   PT Next Visit Plan continue soft tissue mobilization;  consider dry neelding; continue with stretching;  add supine ER; horizontal abduction; D2 flexion with band; consdier pec strethch;    PT Home Exercise Plan levator stretch; upper trap stretch; tennis ball trigger point release; scap retraction; shoulder extension    Consulted and Agree with Plan of Care Patient      Patient will benefit from skilled therapeutic intervention in order to improve the following deficits and impairments:  Pain, Decreased range of motion, Difficulty walking, Decreased activity tolerance, Decreased safety awareness, Increased muscle spasms, Postural dysfunction, Decreased strength  Visit Diagnosis: Cervicalgia  Other muscle spasm  Chronic right shoulder pain   PHYSICAL THERAPY DISCHARGE SUMMARY  Visits from Start of Care: 1  Current functional level related to goals / functional outcomes: Did not return for follow up    Remaining deficits: Unknown    Education / Equipment: HEP  Plan: Patient agrees to discharge.  Patient goals were not met. Patient is being discharged due to not returning since the last visit.  ?????     Problem List Patient Active Problem List   Diagnosis Date Noted  . Allergic rhinitis 05/31/2014  . Asthma, mild intermittent 05/31/2014    DCarney LivingPT DPT  03/15/2017, 9:32 AM  [facility] [address]Phone: [phone]  Fax:  [phone]  Name: Jennifer Holland MRN: [id number] Date of Birth: Nov 04, 1985

## 2017-03-19 ENCOUNTER — Ambulatory Visit: Payer: BLUE CROSS/BLUE SHIELD | Admitting: Physical Therapy

## 2017-05-03 ENCOUNTER — Ambulatory Visit (INDEPENDENT_AMBULATORY_CARE_PROVIDER_SITE_OTHER): Payer: BLUE CROSS/BLUE SHIELD

## 2017-05-03 ENCOUNTER — Encounter: Payer: Self-pay | Admitting: Physician Assistant

## 2017-05-03 ENCOUNTER — Ambulatory Visit (INDEPENDENT_AMBULATORY_CARE_PROVIDER_SITE_OTHER): Payer: BLUE CROSS/BLUE SHIELD | Admitting: Physician Assistant

## 2017-05-03 VITALS — BP 124/78 | HR 84 | Temp 98.3°F | Resp 16 | Ht 69.88 in | Wt 277.4 lb

## 2017-05-03 DIAGNOSIS — K2100 Gastro-esophageal reflux disease with esophagitis, without bleeding: Secondary | ICD-10-CM

## 2017-05-03 DIAGNOSIS — R197 Diarrhea, unspecified: Secondary | ICD-10-CM

## 2017-05-03 DIAGNOSIS — K5730 Diverticulosis of large intestine without perforation or abscess without bleeding: Secondary | ICD-10-CM | POA: Diagnosis not present

## 2017-05-03 LAB — POCT CBC
Granulocyte percent: 67.8 %G (ref 37–80)
HCT, POC: 43.6 % (ref 43.5–53.7)
HEMOGLOBIN: 15 g/dL (ref 14.1–18.1)
LYMPH, POC: 2.8 (ref 0.6–3.4)
MCH: 31.3 pg — AB (ref 27–31.2)
MCHC: 34.5 g/dL (ref 31.8–35.4)
MCV: 90.6 fL (ref 80–97)
MID (CBC): 0.2 (ref 0–0.9)
MPV: 7.1 fL (ref 0–99.8)
POC Granulocyte: 6.4 (ref 2–6.9)
POC LYMPH PERCENT: 30 %L (ref 10–50)
POC MID %: 2.2 % (ref 0–12)
Platelet Count, POC: 305 10*3/uL (ref 142–424)
RBC: 4.81 M/uL (ref 4.69–6.13)
RDW, POC: 13.2 %
WBC: 9.4 10*3/uL (ref 4.6–10.2)

## 2017-05-03 IMAGING — DX DG ABDOMEN 2V
3 series · 3 of 3 positions shown · non-contrast
Comparison: CT abdomen and pelvis [DATE]

CLINICAL DATA: Diarrhea

EXAM:
ABDOMEN - 2 VIEW

[abdomen erect]
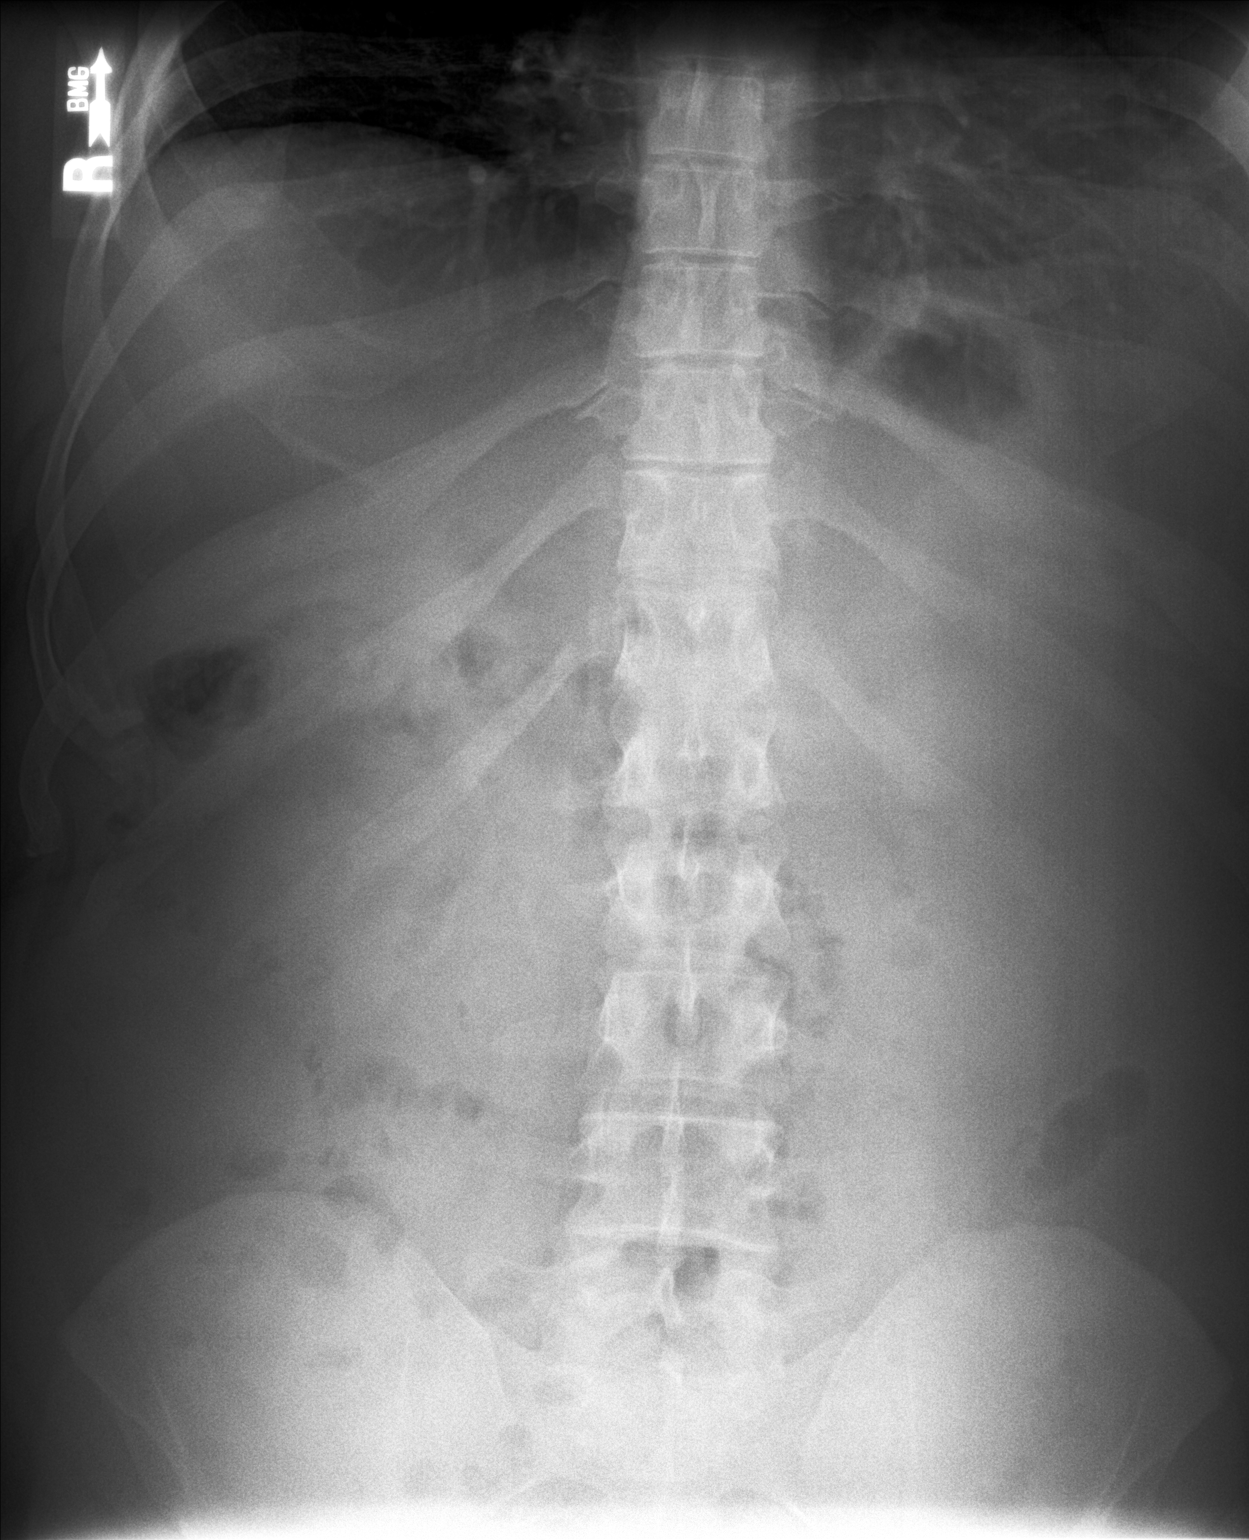

[abdomen supine (1 of 2)]
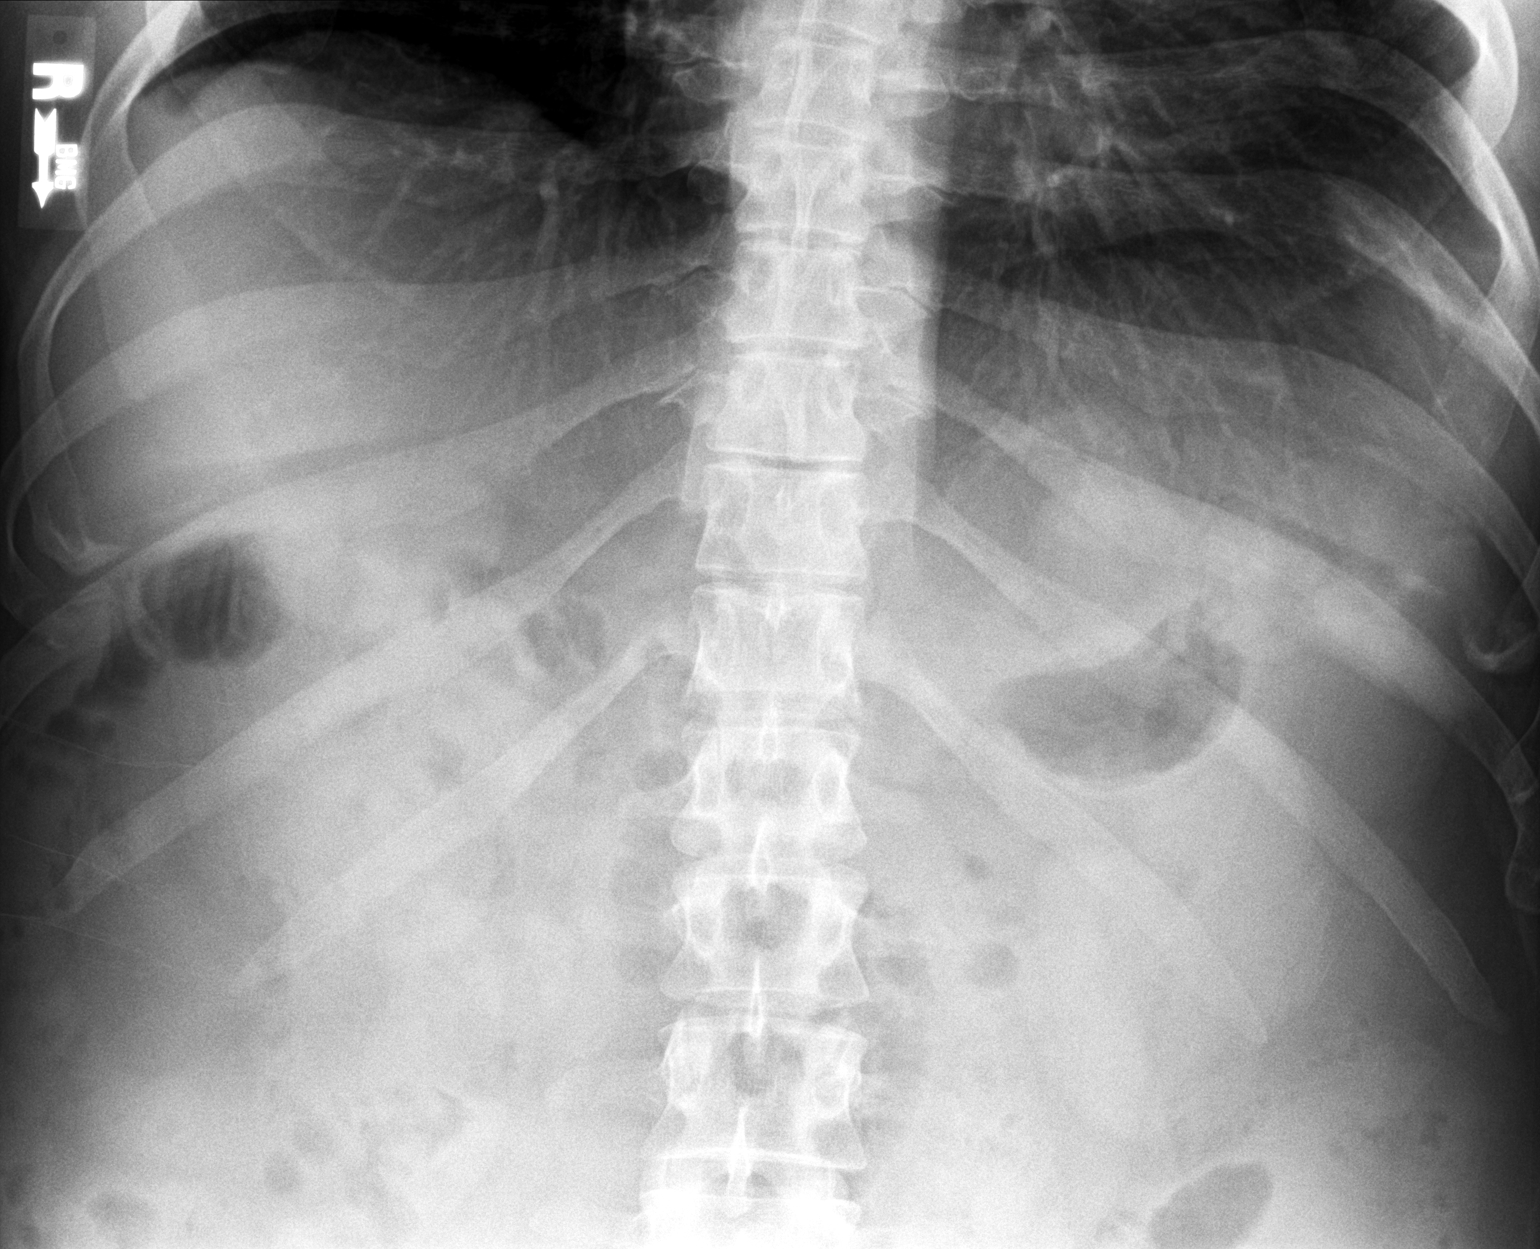

[abdomen supine (2 of 2)]
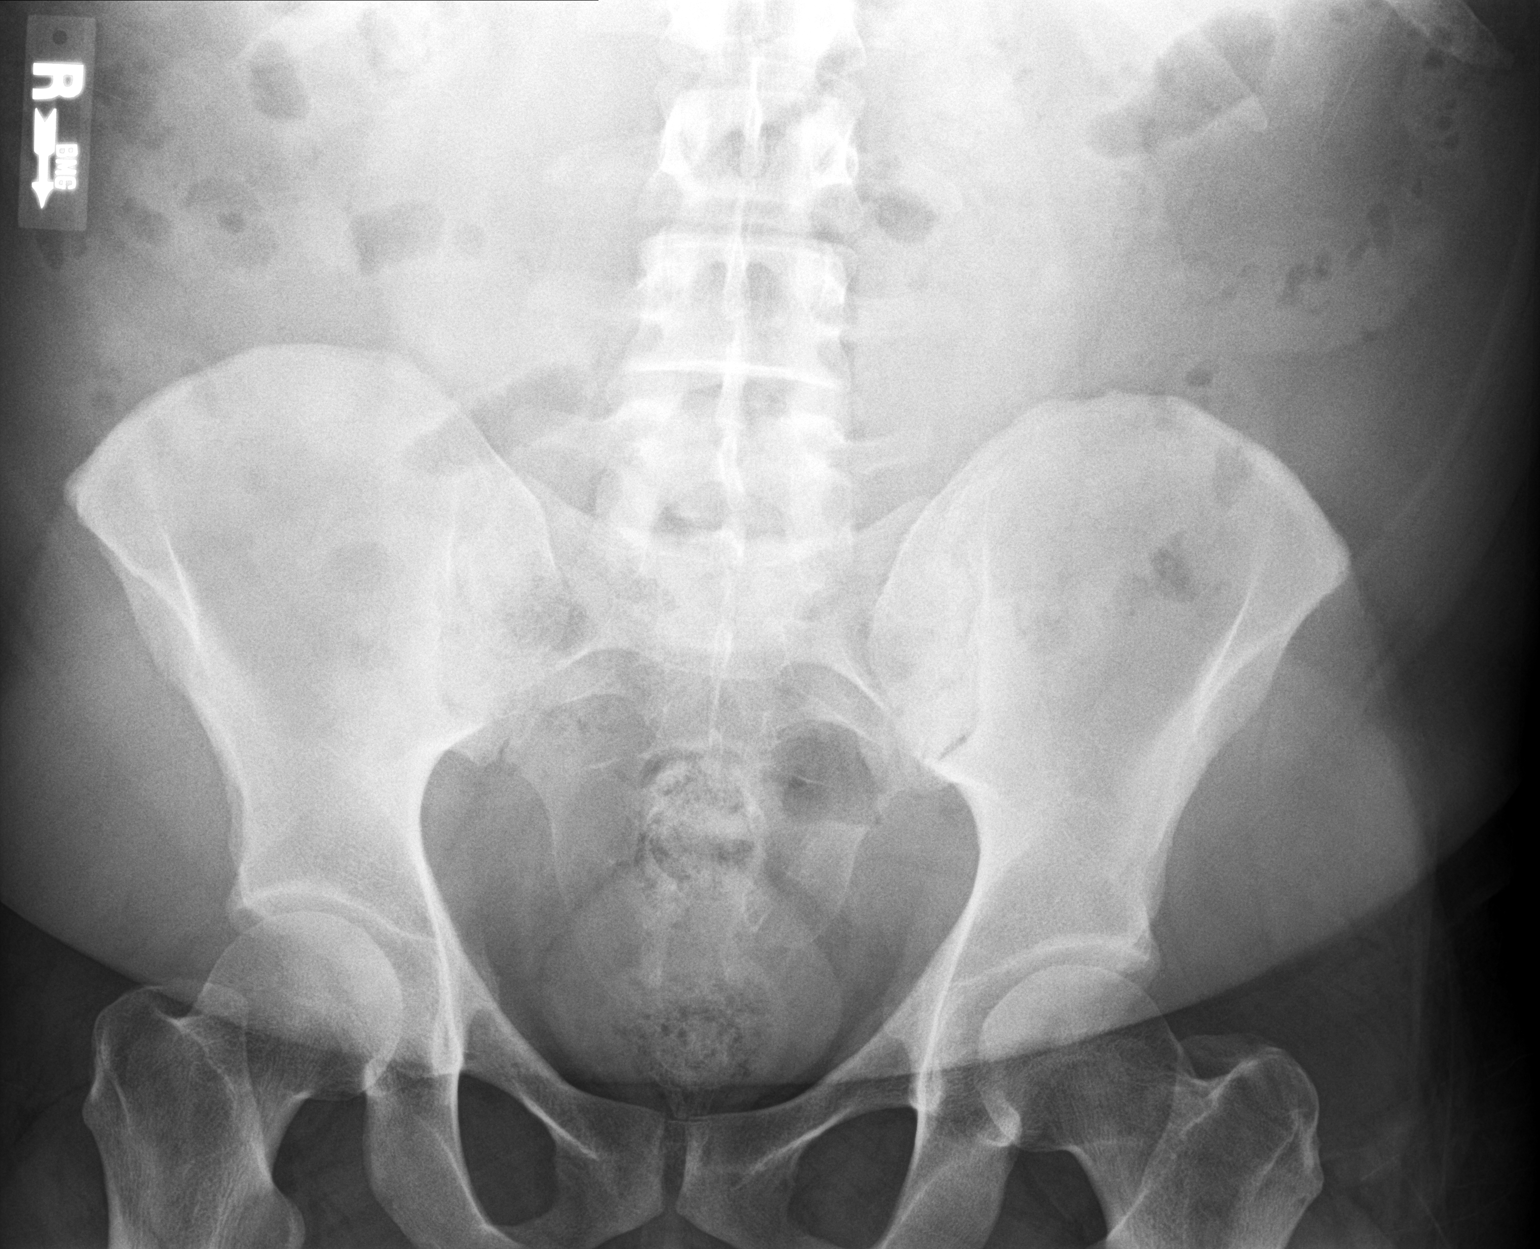

[3 of 3 positions shown; findings below may reference images not displayed]

FINDINGS: Supine and upright images obtained. There is moderate stool in the
colon. There is no bowel dilatation or air-fluid level to suggest
bowel obstruction. No free air. Visualized lung bases are clear. No
abnormal calcifications.
IMPRESSION: Moderate stool in colon.  No bowel obstruction or free air evident.

## 2017-05-03 MED ORDER — PANTOPRAZOLE SODIUM 40 MG PO TBEC: 40.0000 mg | DELAYED_RELEASE_TABLET | Freq: Every day | ORAL | 0 refills | Status: DC

## 2017-05-03 NOTE — Progress Notes (Signed)
05/03/2017 12:29 PM   DOB: 1985/08/05 / MRN: [id number]  SUBJECTIVE:  Brandon Dominguez is a 31 y.o. male with a history of diverticulosis and tells me that over the last two weeks he has had loose stools and associates a burning with this. Having 6-10 loose stools daily.  He denies abdominal pain today. He has tried colace in the past when he has a diverticula flare and this typically helps. He has been having some reflux symptoms in which he will have burning in his throat but this is only once or twice a month. Tells me that diarrhea has been mostly brown but did turn black with taking some peptobismal. Also tried imodium and he tells me that this had no effect.  Tells me that constipation has never really been a big issue for him. No major diet changes in the last six weeks.  No abx in the last 6-8 weeks.   He has No Known Allergies.   He  has a past medical history of Allergy; Asthma; Diverticulitis; Diverticulitis; Diverticulosis; Hepatic steatosis; Malrotation of intestine; and Medical history non-contributory.    He  reports that he has never smoked. He has never used smokeless tobacco. He reports that he drinks alcohol. He reports that he does not use drugs. He  has no sexual activity history on file. The patient  has a past surgical history that includes cranial surgery and Eye surgery.  His family history includes Colon polyps in his mother; Diabetes in his paternal grandfather and paternal grandmother; Diverticulitis in his mother; Heart disease in his paternal aunt, paternal grandfather, and paternal uncle.  Review of Systems  Constitutional: Negative for fever, malaise/fatigue and weight loss.  Gastrointestinal: Positive for diarrhea, heartburn and vomiting. Negative for abdominal pain, constipation, melena and nausea.  Neurological: Negative for dizziness and headaches.    The problem list and medications were reviewed and updated by myself where necessary and exist elsewhere in the  encounter.   OBJECTIVE:  BP 124/78 (BP Location: Left Arm, Patient Position: Sitting, Cuff Size: Large)   Pulse 84   Temp 98.3 F (36.8 C) (Oral)   Resp 16   Ht 5' 9.88" (1.775 m)   Wt 277 lb 6.4 oz (125.8 kg)   SpO2 97%   BMI 39.94 kg/m   Wt Readings from Last 3 Encounters:  05/03/17 277 lb 6.4 oz (125.8 kg)  03/01/17 278 lb 3.2 oz (126.2 kg)  11/19/16 276 lb (125.2 kg)     Physical Exam  Constitutional: He appears well-developed. He is active and cooperative.  Non-toxic appearance.  Cardiovascular: Normal rate, regular rhythm, S1 normal, S2 normal, normal heart sounds, intact distal pulses and normal pulses.  Exam reveals no gallop and no friction rub.   No murmur heard. Pulmonary/Chest: Effort normal. No stridor. No tachypnea. No respiratory distress. He has no wheezes. He has no rales.  Abdominal: Soft. Bowel sounds are normal. He exhibits no distension and no mass. There is no tenderness. There is no rebound and no guarding.    Musculoskeletal: He exhibits no edema.  Neurological: He is alert.  Skin: Skin is warm and dry. He is not diaphoretic. No pallor.  Vitals reviewed.   Results for orders placed or performed in visit on 05/03/17 (from the past 72 hour(s))  POCT CBC     Status: Abnormal   Collection Time: 05/03/17 11:54 AM  Result Value Ref Range   WBC 9.4 4.6 - 10.2 K/uL   Lymph, poc 2.8 0.6 -  3.4   POC LYMPH PERCENT 30.0 10 - 50 %L   MID (cbc) 0.2 0 - 0.9   POC MID % 2.2 0 - 12 %M   POC Granulocyte 6.4 2 - 6.9   Granulocyte percent 67.8 37 - 80 %G   RBC 4.81 4.69 - 6.13 M/uL   Hemoglobin 15.0 14.1 - 18.1 g/dL   HCT, POC 16.1 09.6 - 53.7 %   MCV 90.6 80 - 97 fL   MCH, POC 31.3 (A) 27 - 31.2 pg   MCHC 34.5 31.8 - 35.4 g/dL   RDW, POC 04.5 %   Platelet Count, POC 305 142 - 424 K/uL   MPV 7.1 0 - 99.8 fL    Dg Abd 2 Views  Result Date: 05/03/2017 CLINICAL DATA:  Diarrhea EXAM: ABDOMEN - 2 VIEW COMPARISON:  CT abdomen and pelvis August 03, 2013  FINDINGS: Supine and upright images obtained. There is moderate stool in the colon. There is no bowel dilatation or air-fluid level to suggest bowel obstruction. No free air. Visualized lung bases are clear. No abnormal calcifications. IMPRESSION: Moderate stool in colon.  No bowel obstruction or free air evident. Electronically Signed   By: Bretta Bang III M.D.   On: 05/03/2017 11:28    ASSESSMENT AND PLAN:  Anan was seen today for diverticulitis.  Diagnoses and all orders for this visit:  Diarrhea, unspecified type: Work up and exam thus far are reassuring. If my work up is negative and he is not improving on PPI I will plan to CT his abdomen.   -     DG Abd 2 Views; Future -     POCT CBC -     GI Profile, Stool, PCR -     CMP and Liver -     POCT SEDIMENTATION RATE  Diverticulosis of colon without hemorrhage  Gastroesophageal reflux disease with esophagitis -     H. pylori breath test -     pantoprazole (PROTONIX) 40 MG tablet; Take 1 tablet (40 mg total) by mouth daily. Take one tablet in the morning 30 minutes before breakfast.    The patient is advised to call or return to clinic if he does not see an improvement in symptoms, or to seek the care of the closest emergency department if he worsens with the above plan.   Deliah Boston, MHS, PA-C Primary Care at [facility] 05/03/2017 12:29 PM

## 2017-05-03 NOTE — Patient Instructions (Signed)
     IF you received an x-ray today, you will receive an invoice from [facility]. Please contact [facility] at [phone] with questions or concerns regarding your invoice.   IF you received labwork today, you will receive an invoice from [facility]. Please contact [facility] at [phone] with questions or concerns regarding your invoice.   Our billing staff will not be able to assist you with questions regarding bills from these companies.  You will be contacted with the lab results as soon as they are available. The fastest way to get your results is to activate your My Chart account. Instructions are located on the last page of this paperwork. If you have not heard from us regarding the results in 2 weeks, please contact this office.     

## 2017-05-04 LAB — CMP AND LIVER
ALK PHOS: 67 IU/L (ref 39–117)
ALT: 38 IU/L (ref 0–44)
AST: 25 IU/L (ref 0–40)
Albumin: 4.6 g/dL (ref 3.5–5.5)
BILIRUBIN, DIRECT: 0.12 mg/dL (ref 0.00–0.40)
BUN: 12 mg/dL (ref 6–20)
Bilirubin Total: 0.4 mg/dL (ref 0.0–1.2)
CO2: 20 mmol/L (ref 20–29)
CREATININE: 0.83 mg/dL (ref 0.76–1.27)
Calcium: 9.4 mg/dL (ref 8.7–10.2)
Chloride: 106 mmol/L (ref 96–106)
GFR calc Af Amer: 134 mL/min/{1.73_m2} (ref >59)
GFR calc non Af Amer: 116 mL/min/{1.73_m2} (ref >59)
Glucose: 79 mg/dL (ref 65–99)
Potassium: 4.1 mmol/L (ref 3.5–5.2)
Sodium: 144 mmol/L (ref 134–144)
Total Protein: 7.4 g/dL (ref 6.0–8.5)

## 2017-05-05 LAB — GI PROFILE, STOOL, PCR
Adenovirus F 40/41: NOT DETECTED
Astrovirus: NOT DETECTED
C DIFFICILE TOXIN A/B: NOT DETECTED
CRYPTOSPORIDIUM: NOT DETECTED
CYCLOSPORA CAYETANENSIS: NOT DETECTED
Campylobacter: NOT DETECTED
Entamoeba histolytica: NOT DETECTED
Enteroaggregative E coli: NOT DETECTED
Enteropathogenic E coli: NOT DETECTED
Enterotoxigenic E coli: NOT DETECTED
Giardia lamblia: NOT DETECTED
Norovirus GI/GII: NOT DETECTED
PLESIOMONAS SHIGELLOIDES: NOT DETECTED
Rotavirus A: NOT DETECTED
SAPOVIRUS: NOT DETECTED
SHIGELLA/ENTEROINVASIVE E COLI: NOT DETECTED
Salmonella: NOT DETECTED
Shiga-toxin-producing E coli: NOT DETECTED
VIBRIO CHOLERAE: NOT DETECTED
Vibrio: NOT DETECTED
Yersinia enterocolitica: NOT DETECTED

## 2017-05-05 LAB — H. PYLORI BREATH TEST: H PYLORI BREATH TEST: NEGATIVE

## 2017-06-21 ENCOUNTER — Encounter: Payer: Self-pay | Admitting: Physician Assistant

## 2017-06-21 ENCOUNTER — Other Ambulatory Visit: Payer: Self-pay

## 2017-06-21 ENCOUNTER — Ambulatory Visit: Payer: BLUE CROSS/BLUE SHIELD | Admitting: Physician Assistant

## 2017-06-21 VITALS — BP 130/82 | HR 90 | Temp 98.8°F | Resp 16 | Ht 69.88 in | Wt 280.0 lb

## 2017-06-21 DIAGNOSIS — K2100 Gastro-esophageal reflux disease with esophagitis, without bleeding: Secondary | ICD-10-CM

## 2017-06-21 DIAGNOSIS — F988 Other specified behavioral and emotional disorders with onset usually occurring in childhood and adolescence: Secondary | ICD-10-CM | POA: Diagnosis not present

## 2017-06-21 DIAGNOSIS — R1319 Other dysphagia: Secondary | ICD-10-CM

## 2017-06-21 DIAGNOSIS — R1310 Dysphagia, unspecified: Secondary | ICD-10-CM

## 2017-06-21 MED ORDER — METHYLPHENIDATE HCL ER (OSM) 27 MG PO TBCR: 27.0000 mg | EXTENDED_RELEASE_TABLET | ORAL | 0 refills | Status: DC

## 2017-06-21 MED ORDER — METHYLPHENIDATE HCL ER (OSM) 27 MG PO TBCR
27.0000 mg | EXTENDED_RELEASE_TABLET | ORAL | 0 refills | Status: DC
Start: 2017-06-21 — End: 2018-07-11

## 2017-06-21 NOTE — Patient Instructions (Addendum)
Take the pantoprazole every monring as prescribed while waiting to get into GI.   Let me know if you have any problems with the Concerta. See you in three months.     IF you received an x-ray today, you will receive an invoice from [facility]. Please contact [facility] at [phone] with questions or concerns regarding your invoice.   IF you received labwork today, you will receive an invoice from [facility]. Please contact [facility] at [phone] with questions or concerns regarding your invoice.   Our billing staff will not be able to assist you with questions regarding bills from these companies.  You will be contacted with the lab results as soon as they are available. The fastest way to get your results is to activate your My Chart account. Instructions are located on the last page of this paperwork. If you have not heard from us regarding the results in 2 weeks, please contact this office.    Sleep Hygiene   Sleep hygiene education - Sleep hygiene teaches good sleeping habits . This includes:  ?Sleep only as much as necessary to feel rested and then get out of bed. ?Maintain a regular sleep schedule (the same bedtime and wake time every day). ?Do not force sleep. ?Avoid caffeinated beverages after lunch. ?Avoid alcohol near bedtime. ?Do not smoke (particularly during the evening). ?Do not go to bed hungry. ?Adjust the bedroom environment (light, noise, temperature) so that you are comfortable before you lie down. ?Deal with concerns or worries before bedtime. Make a list of things to work on for the next day so anxiety is reduced at night. ?Exercise regularly, preferably four or more hours before bedtime. ?Avoid prolonged use of phones or reading devices ("e-books") that give off light before bed. This can make it harder to fall asleep.  Relaxation - Relaxation therapy involves progressively relaxing your muscles from your head down to your feet. Here is a  sample of a relaxation program: Beginning with the muscles in your face, squeeze (contract) your muscles gently for one to two seconds and then relax. Repeat several times. Use the same technique for other muscle groups, usually in the following sequence: jaw and neck, shoulders, upper arms, lower arms, fingers, chest, abdomen, buttocks, thighs, calves, and feet. Repeat this cycle for 45 minutes, if necessary. This relaxation program can promote restfulness and sleep. Relaxation therapy is sometimes combined with biofeedback.  Biofeedback - Biofeedback uses sensors placed on your skin to track muscle tension or brain rhythms. You can see a display of your tension level or activity, allowing you to gauge your level of tension and develop strategies to reduce this tension. As an example, you may slow your breathing, progressively relax muscles, or practice deep breathing to reduce tension.  Stimulus control - Stimulus control therapy is based on the idea that some people with insomnia have learned to associate the bedroom with staying awake rather than sleeping. ?You should spend no more than 20 minutes lying in bed trying to fall asleep. ?If you cannot fall asleep within 20 minutes, get up, go to another room and read or find another relaxing activity until you feel sleepy again. Activities such as eating, balancing your checkbook, doing housework, watching TV, or studying for a test, which "reward" you for staying awake, should be avoided. ?When you start to feel sleepy, you can return to bed. If you cannot fall asleep in another 20 minutes, repeat the process. ?Set an alarm clock and get up at the  same time every day, including weekends. ?Do not take a nap during the day. You may not sleep much on the first night. However, sleep is more likely on succeeding nights because sleepiness is increased and naps are not allowed. Sleep restriction - Some people with insomnia have long awakenings during the night  and some try to deal with their poor sleep by staying in bed longer in the morning to "make up" some of their lost sleep. This additional sleep later in the morning may make it more difficult to fall asleep that night, resulting in the need to stay in bed even longer the following morning. Sleep restriction consolidates sleep and breaks this cycle. ?The first step in sleep restriction therapy is to estimate the average number of hours per night that you sleep. Decrease the total time allowed in bed per night to that average sleep time, as long as it is not less than four hours. ?A rigid bedtime and awakening time are recommended and naps are not permitted. This causes partial sleep deprivation, which increases your ability to sleep the next night. ?Once sleep has improved, you may slowly increase your time in bed to find your needed hours of sleep. During the first few days to weeks, you may feel sleepy during the day and may have difficulty being alert. You can deal with this by increasing activity levels when sleepy, avoiding sedentary activities, and discussing the sleep restriction therapy with your therapist, who may need to fine tune sleep times. It is best to try sleep restriction therapy with a therapist because reducing sleep too much can produce sleepiness that can result in accidents.

## 2017-06-21 NOTE — Progress Notes (Signed)
06/22/2017 11:30 AM   DOB: 1986-02-28 / MRN: [id number]  SUBJECTIVE:  Brandon Dominguez is a 31 y.o. male presenting to discuss his history ADD.  Diagnosed in the 3rd grade due to difficulty with concentration and says "I couldn't get anything done." Started on Ritalin and eventually Adderall and was eventually switched to Concerta in about th 8th grade and he took this until age 31 at which point the med was stopped due to a lack of health insurance.   Tells me that he knows he has lost jobs in the recent past due to a his history of ADD and a lack of medication.  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  Tells me that even now at his current job he is missing standard tasks and this has led to administrative action. TElls me that he has tried Wellbutrin in the past and this made him feel "very strange."  He was having some stomach problems and my work up did not yeild any specific diagnosis. Tells me that pantoprazole did seem to help and he now realizes this was acid.  Is taking Ibuprofen 400 roughly 1 time a week. Is no noticing that he is having some difficulty with swallowing and actually needing to push on his throat to help food move down. This is relatively new for him. He is will to see GI.   He has No Known Allergies.   He  has a past medical history of Allergy, Asthma, Diverticulitis, Diverticulitis, Diverticulosis, Hepatic steatosis, Malrotation of intestine, and Medical history non-contributory.    He  reports that  has never smoked. he has never used smokeless tobacco. He reports that he drinks alcohol. He reports that he does not use drugs. He  has no sexual activity history on file. The patient  has a past surgical history that includes cranial surgery and Eye surgery.  His family history includes Colon polyps in his mother; Diabetes in his paternal grandfather and paternal grandmother; Diverticulitis in his  mother; Heart disease in his paternal aunt, paternal grandfather, and paternal uncle.  Review of Systems  Constitutional: Negative for chills, diaphoresis and fever.  Respiratory: Negative for shortness of breath.   Cardiovascular: Negative for chest pain, orthopnea and leg swelling.  Gastrointestinal: Positive for heartburn. Negative for abdominal pain, constipation, diarrhea and nausea.  Skin: Negative for rash.  Neurological: Negative for dizziness.   Lab Results  Component Value Date   WBC 9.4 05/03/2017   HGB 15.0 05/03/2017   HCT 43.6 05/03/2017   MCV 90.6 05/03/2017   PLT 268 03/01/2017     The problem list and medications were reviewed and updated by myself where necessary and exist elsewhere in the encounter.   OBJECTIVE:  BP 130/82 (BP Location: Right Arm, Patient Position: Sitting, Cuff Size: Large)   Pulse 90   Temp 98.8 F (37.1 C) (Oral)   Resp 16   Ht 5' 9.88" (1.775 m)   Wt 280 lb (127 kg)   SpO2 96%   BMI 40.31 kg/m   Physical Exam  Constitutional: He appears well-developed. He is active and cooperative.  Non-toxic appearance.  HENT:  Mouth/Throat: Oropharynx is clear and moist.  Cardiovascular: Normal rate, regular rhythm, S1 normal, S2 normal, normal heart sounds, intact distal pulses and normal pulses. Exam reveals no gallop and no friction rub.  No murmur heard. Pulmonary/Chest: Effort normal. No stridor. No tachypnea. No respiratory distress.  He has no wheezes. He has no rales.  Abdominal: He exhibits no distension and no mass.  Musculoskeletal: He exhibits no edema.  Neurological: He is alert.  Skin: Skin is warm and dry. He is not diaphoretic. No pallor.  Psychiatric: He has a normal mood and affect. His behavior is normal. Judgment and thought content normal.  Vitals reviewed.   No results found for this or any previous visit (from the past 72 hour(s)).  No results found.  ASSESSMENT AND PLAN:  Brandon Dominguez was seen today for  adhd.  Diagnoses and all orders for this visit:  Esophageal dysphagia: He will try to be more diligent with the pantoprazole and discuss the outcome of PPI on this problem with GI.  -     Ambulatory referral to Gastroenterology  Gastroesophageal reflux disease with esophagitis  Attention deficit disorder (ADD) without hyperactivity -     methylphenidate (CONCERTA) 27 MG PO CR tablet; Take 1 tablet (27 mg total) by mouth every morning. Take only on workdays. -     methylphenidate (CONCERTA) 27 MG PO CR tablet; Take 1 tablet (27 mg total) by mouth every morning. Fill 30 days after signed. -     methylphenidate (CONCERTA) 27 MG PO CR tablet; Take 1 tablet (27 mg total) by mouth every morning. Fill 60 days after signed.   The patient is advised to call or return to clinic if he does not see an improvement in symptoms, or to seek the care of the closest emergency department if he worsens with the above plan.   Deliah BostonMichael Octavia Mottola, MHS, PA-C Primary Care at [facility] 06/22/2017 11:30 AM

## 2017-07-08 ENCOUNTER — Telehealth: Payer: Self-pay | Admitting: *Deleted

## 2017-07-08 NOTE — Telephone Encounter (Signed)
I have left voicemail for patient to call back. He has been scheduled for an office visit on 08/06/17 at 3:00 pm.

## 2017-07-08 NOTE — Telephone Encounter (Signed)
-----   Message from Beverley FiedlerJay M Pyrtle, MD sent at 07/08/2017  4:53 PM EST ----- Regarding: New pt appt Pt's father and mother (father had recent colon) requested he be seen by me for 2nd opinion regarding recurrent diverticulitis and to discuss possible surgical resection for the same. Can be worked in in Jan 2019 His phone is [phone]

## 2017-07-09 NOTE — Telephone Encounter (Signed)
Noted  

## 2017-07-09 NOTE — Telephone Encounter (Signed)
Pt called back and confirmed appt for 08-06-17

## 2017-07-29 ENCOUNTER — Encounter: Payer: Self-pay | Admitting: *Deleted

## 2017-08-06 ENCOUNTER — Ambulatory Visit: Payer: BLUE CROSS/BLUE SHIELD | Admitting: Internal Medicine

## 2017-08-23 ENCOUNTER — Ambulatory Visit: Payer: BLUE CROSS/BLUE SHIELD | Admitting: Physician Assistant

## 2018-07-11 ENCOUNTER — Encounter: Payer: Self-pay | Admitting: Family Medicine

## 2018-07-11 ENCOUNTER — Ambulatory Visit: Payer: BLUE CROSS/BLUE SHIELD | Admitting: Family Medicine

## 2018-07-11 VITALS — BP 142/85 | HR 87 | Temp 98.2°F | Ht 69.88 in | Wt 301.2 lb

## 2018-07-11 DIAGNOSIS — F988 Other specified behavioral and emotional disorders with onset usually occurring in childhood and adolescence: Secondary | ICD-10-CM | POA: Diagnosis not present

## 2018-07-11 MED ORDER — METHYLPHENIDATE HCL ER (OSM) 27 MG PO TBCR: 27.0000 mg | EXTENDED_RELEASE_TABLET | ORAL | 0 refills | Status: DC

## 2018-07-11 NOTE — Progress Notes (Signed)
BP (!) 142/85   Pulse 87   Temp 98.2 F (36.8 C) (Oral)   Ht 5' 9.88" (1.775 m)   Wt (!) 301 lb 3.2 oz (136.6 kg)   BMI 43.37 kg/m    Subjective:    Patient ID: Brandon ShookBryan Messenger, male    DOB: 12/15/1985, 32 y.o.   MRN: [id number]  HPI: Brandon Dominguez is a 32 y.o. male presenting on 07/11/2018 for ADHD   HPI ADHD Patient is coming in for ADHD recheck today.  He has been taking Concerta in the past but because of loss of insurance he has not been in in some time and would like to get restarted on it.  He actually did lose his job for the time When he was off of the Concerta.  He does much better on it and would like to restart it again.  He has been on ADHD medication since he was in high school and Concerta is the one that has done best for him.  Relevant past medical, surgical, family and social history reviewed and updated as indicated. Interim medical history since our last visit reviewed. Allergies and medications reviewed and updated.  Review of Systems  Constitutional: Negative for chills and fever.  Eyes: Negative for discharge.  Respiratory: Negative for shortness of breath and wheezing.   Cardiovascular: Negative for chest pain and leg swelling.  Musculoskeletal: Negative for back pain and gait problem.  Skin: Negative for rash.  Neurological: Negative for dizziness and weakness.  Psychiatric/Behavioral: Positive for decreased concentration. Negative for dysphoric mood, self-injury, sleep disturbance and suicidal ideas. The patient is not nervous/anxious and is not hyperactive.   All other systems reviewed and are negative.   Per HPI unless specifically indicated above   Allergies as of 07/11/2018   No Known Allergies     Medication List       Accurate as of July 11, 2018  4:45 PM. Always use your most recent med list.        methylphenidate 27 MG CR tablet Commonly known as:  CONCERTA Take 1 tablet (27 mg total) by mouth every morning. Take only on  workdays.   methylphenidate 27 MG CR tablet Commonly known as:  CONCERTA Take 1 tablet (27 mg total) by mouth every morning. Fill 30 days after prescription date   methylphenidate 27 MG CR tablet Commonly known as:  CONCERTA Take 1 tablet (27 mg total) by mouth every morning. Fill 60 days after prescription date.   pantoprazole 40 MG tablet Commonly known as:  PROTONIX Take 1 tablet (40 mg total) by mouth daily. Take one tablet in the morning 30 minutes before breakfast.          Objective:    BP (!) 142/85   Pulse 87   Temp 98.2 F (36.8 C) (Oral)   Ht 5' 9.88" (1.775 m)   Wt (!) 301 lb 3.2 oz (136.6 kg)   BMI 43.37 kg/m   Wt Readings from Last 3 Encounters:  07/11/18 (!) 301 lb 3.2 oz (136.6 kg)  06/21/17 280 lb (127 kg)  05/03/17 277 lb 6.4 oz (125.8 kg)    Physical Exam Vitals signs and nursing note reviewed.  Constitutional:      General: He is not in acute distress.    Appearance: He is well-developed. He is not diaphoretic.  Eyes:     General: No scleral icterus.    Conjunctiva/sclera: Conjunctivae normal.  Neck:     Musculoskeletal: Neck supple.  Thyroid: No thyromegaly.  Cardiovascular:     Rate and Rhythm: Normal rate and regular rhythm.     Heart sounds: Normal heart sounds. No murmur.  Pulmonary:     Effort: Pulmonary effort is normal. No respiratory distress.     Breath sounds: Normal breath sounds. No wheezing.  Musculoskeletal: Normal range of motion.  Lymphadenopathy:     Cervical: No cervical adenopathy.  Skin:    General: Skin is warm and dry.     Findings: No rash.  Neurological:     Mental Status: He is alert and oriented to person, place, and time.     Coordination: Coordination normal.  Psychiatric:        Behavior: Behavior normal.         Assessment & Plan:   Problem List Items Addressed This Visit      Other   Attention deficit disorder (ADD) without hyperactivity - Primary   Relevant Medications   methylphenidate  (CONCERTA) 27 MG PO CR tablet   methylphenidate (CONCERTA) 27 MG PO CR tablet   methylphenidate (CONCERTA) 27 MG PO CR tablet   Other Relevant Orders   ToxASSURE Select 13 (MW), Urine       Follow up plan: Return in about 3 months (around 10/10/2018), or if symptoms worsen or fail to improve, for ADHD medication.  Counseling provided for all of the vaccine components Orders Placed This Encounter  Procedures  . ToxASSURE Select 13 (MW), Urine    Arville CareJoshua Elzada Pytel, MD [facility] 07/11/2018, 4:45 PM

## 2018-07-16 LAB — TOXASSURE SELECT 13 (MW), URINE

## 2018-08-01 ENCOUNTER — Telehealth: Payer: Self-pay | Admitting: Family Medicine

## 2018-08-01 DIAGNOSIS — F988 Other specified behavioral and emotional disorders with onset usually occurring in childhood and adolescence: Secondary | ICD-10-CM

## 2018-08-01 NOTE — Telephone Encounter (Signed)
Methylphenidate is just the generic for Concerta it is Concerta that is what was prescribed from they just gave him the generic.  If he wants [place] to try and change the prescriptions so that they come across as brand only then we can call the pharmacy and see if they will change that for him but if not we will have to wait till next visit.

## 2018-08-02 NOTE — Telephone Encounter (Signed)
lmtcb

## 2018-08-02 NOTE — Telephone Encounter (Signed)
Pt is in Brandon Dominguez now and needs 2 rx sent to [facility] in [place] for brand name Concerta and I will cancel the rx's at [facility] in [place]. Can we do this?

## 2018-08-02 NOTE — Telephone Encounter (Signed)
rtn call °

## 2018-08-03 ENCOUNTER — Telehealth: Payer: Self-pay | Admitting: Family Medicine

## 2018-08-03 MED ORDER — CONCERTA 27 MG PO TBCR: 27.0000 mg | EXTENDED_RELEASE_TABLET | ORAL | 0 refills | Status: DC

## 2018-08-03 NOTE — Telephone Encounter (Signed)
Patient aware, per Paula Compton she has already cancelled the other prescriptions for generic.

## 2018-08-03 NOTE — Telephone Encounter (Signed)
Patient states he would like to talk to prior Berkley Harveyauth - wanting to know if she has received a prior auth from insurance for Concerta and if so how long is prior auth turn around? Please call patient.

## 2018-08-03 NOTE — Telephone Encounter (Signed)
Please let the patient know that I sent these as brand-name Concerta for 2 more refills to the pharmacy he requested, please cancel all the previous 2 prescriptions.

## 2018-10-13 ENCOUNTER — Ambulatory Visit: Payer: 59 | Admitting: Family Medicine

## 2020-09-13 DIAGNOSIS — E1165 Type 2 diabetes mellitus with hyperglycemia: Secondary | ICD-10-CM | POA: Diagnosis not present

## 2020-09-13 DIAGNOSIS — E119 Type 2 diabetes mellitus without complications: Secondary | ICD-10-CM | POA: Insufficient documentation

## 2021-08-14 DIAGNOSIS — Z6837 Body mass index (BMI) 37.0-37.9, adult: Secondary | ICD-10-CM | POA: Diagnosis not present

## 2021-08-14 DIAGNOSIS — K529 Noninfective gastroenteritis and colitis, unspecified: Secondary | ICD-10-CM | POA: Diagnosis not present

## 2021-08-14 DIAGNOSIS — R110 Nausea: Secondary | ICD-10-CM | POA: Diagnosis not present

## 2021-10-30 DIAGNOSIS — M5412 Radiculopathy, cervical region: Secondary | ICD-10-CM | POA: Diagnosis not present

## 2021-11-13 DIAGNOSIS — M5412 Radiculopathy, cervical region: Secondary | ICD-10-CM | POA: Diagnosis not present

## 2021-11-13 DIAGNOSIS — I10 Essential (primary) hypertension: Secondary | ICD-10-CM | POA: Diagnosis not present

## 2021-11-25 DIAGNOSIS — M25512 Pain in left shoulder: Secondary | ICD-10-CM | POA: Diagnosis not present

## 2021-11-25 DIAGNOSIS — M546 Pain in thoracic spine: Secondary | ICD-10-CM | POA: Diagnosis not present

## 2021-11-25 DIAGNOSIS — M542 Cervicalgia: Secondary | ICD-10-CM | POA: Diagnosis not present

## 2021-12-25 ENCOUNTER — Emergency Department (HOSPITAL_COMMUNITY): Payer: BC Managed Care – PPO

## 2021-12-25 ENCOUNTER — Other Ambulatory Visit: Payer: Self-pay

## 2021-12-25 ENCOUNTER — Encounter (HOSPITAL_COMMUNITY): Payer: Self-pay | Admitting: Internal Medicine

## 2021-12-25 ENCOUNTER — Encounter (HOSPITAL_COMMUNITY): Payer: Self-pay | Admitting: Emergency Medicine

## 2021-12-25 ENCOUNTER — Inpatient Hospital Stay (HOSPITAL_COMMUNITY): Payer: BC Managed Care – PPO

## 2021-12-25 ENCOUNTER — Inpatient Hospital Stay (HOSPITAL_COMMUNITY)
Admission: EM | Admit: 2021-12-25 | Discharge: 2021-12-30 | DRG: 418 | Disposition: A | Discharge status: Home / Self Care | Payer: BC Managed Care – PPO | Attending: General Surgery | Admitting: General Surgery

## 2021-12-25 DIAGNOSIS — K859 Acute pancreatitis without necrosis or infection, unspecified: Secondary | ICD-10-CM | POA: Diagnosis present

## 2021-12-25 DIAGNOSIS — K660 Peritoneal adhesions (postprocedural) (postinfection): Secondary | ICD-10-CM | POA: Diagnosis present

## 2021-12-25 DIAGNOSIS — K439 Ventral hernia without obstruction or gangrene: Secondary | ICD-10-CM | POA: Diagnosis present

## 2021-12-25 DIAGNOSIS — K2980 Duodenitis without bleeding: Secondary | ICD-10-CM | POA: Diagnosis not present

## 2021-12-25 DIAGNOSIS — Z8371 Family history of colonic polyps: Secondary | ICD-10-CM | POA: Diagnosis not present

## 2021-12-25 DIAGNOSIS — Z833 Family history of diabetes mellitus: Secondary | ICD-10-CM | POA: Diagnosis not present

## 2021-12-25 DIAGNOSIS — R7303 Prediabetes: Secondary | ICD-10-CM | POA: Diagnosis not present

## 2021-12-25 DIAGNOSIS — K5730 Diverticulosis of large intestine without perforation or abscess without bleeding: Secondary | ICD-10-CM | POA: Diagnosis not present

## 2021-12-25 DIAGNOSIS — R0789 Other chest pain: Secondary | ICD-10-CM | POA: Diagnosis not present

## 2021-12-25 DIAGNOSIS — K5939 Other megacolon: Secondary | ICD-10-CM | POA: Diagnosis not present

## 2021-12-25 DIAGNOSIS — K429 Umbilical hernia without obstruction or gangrene: Secondary | ICD-10-CM | POA: Diagnosis not present

## 2021-12-25 DIAGNOSIS — R1011 Right upper quadrant pain: Secondary | ICD-10-CM | POA: Diagnosis not present

## 2021-12-25 DIAGNOSIS — K851 Biliary acute pancreatitis without necrosis or infection: Secondary | ICD-10-CM | POA: Diagnosis not present

## 2021-12-25 DIAGNOSIS — F988 Other specified behavioral and emotional disorders with onset usually occurring in childhood and adolescence: Secondary | ICD-10-CM | POA: Diagnosis not present

## 2021-12-25 DIAGNOSIS — E119 Type 2 diabetes mellitus without complications: Secondary | ICD-10-CM | POA: Diagnosis not present

## 2021-12-25 DIAGNOSIS — K420 Umbilical hernia with obstruction, without gangrene: Secondary | ICD-10-CM | POA: Diagnosis not present

## 2021-12-25 DIAGNOSIS — F909 Attention-deficit hyperactivity disorder, unspecified type: Secondary | ICD-10-CM | POA: Diagnosis present

## 2021-12-25 DIAGNOSIS — E6609 Other obesity due to excess calories: Secondary | ICD-10-CM | POA: Diagnosis present

## 2021-12-25 DIAGNOSIS — K219 Gastro-esophageal reflux disease without esophagitis: Secondary | ICD-10-CM | POA: Diagnosis not present

## 2021-12-25 DIAGNOSIS — R7401 Elevation of levels of liver transaminase levels: Secondary | ICD-10-CM | POA: Diagnosis present

## 2021-12-25 DIAGNOSIS — R079 Chest pain, unspecified: Secondary | ICD-10-CM | POA: Diagnosis not present

## 2021-12-25 DIAGNOSIS — K668 Other specified disorders of peritoneum: Secondary | ICD-10-CM | POA: Diagnosis not present

## 2021-12-25 DIAGNOSIS — R059 Cough, unspecified: Secondary | ICD-10-CM | POA: Diagnosis not present

## 2021-12-25 DIAGNOSIS — K760 Fatty (change of) liver, not elsewhere classified: Secondary | ICD-10-CM | POA: Diagnosis not present

## 2021-12-25 DIAGNOSIS — E785 Hyperlipidemia, unspecified: Secondary | ICD-10-CM | POA: Diagnosis not present

## 2021-12-25 DIAGNOSIS — Q433 Congenital malformations of intestinal fixation: Secondary | ICD-10-CM

## 2021-12-25 DIAGNOSIS — J45909 Unspecified asthma, uncomplicated: Secondary | ICD-10-CM | POA: Diagnosis present

## 2021-12-25 DIAGNOSIS — K8020 Calculus of gallbladder without cholecystitis without obstruction: Secondary | ICD-10-CM | POA: Diagnosis not present

## 2021-12-25 DIAGNOSIS — Z6837 Body mass index (BMI) 37.0-37.9, adult: Secondary | ICD-10-CM | POA: Diagnosis not present

## 2021-12-25 DIAGNOSIS — Z8249 Family history of ischemic heart disease and other diseases of the circulatory system: Secondary | ICD-10-CM | POA: Diagnosis not present

## 2021-12-25 DIAGNOSIS — K6389 Other specified diseases of intestine: Secondary | ICD-10-CM | POA: Diagnosis not present

## 2021-12-25 DIAGNOSIS — K858 Other acute pancreatitis without necrosis or infection: Secondary | ICD-10-CM | POA: Diagnosis not present

## 2021-12-25 DIAGNOSIS — K8012 Calculus of gallbladder with acute and chronic cholecystitis without obstruction: Secondary | ICD-10-CM | POA: Diagnosis not present

## 2021-12-25 DIAGNOSIS — Y92009 Unspecified place in unspecified non-institutional (private) residence as the place of occurrence of the external cause: Secondary | ICD-10-CM | POA: Diagnosis not present

## 2021-12-25 DIAGNOSIS — T383X6A Underdosing of insulin and oral hypoglycemic [antidiabetic] drugs, initial encounter: Secondary | ICD-10-CM | POA: Diagnosis present

## 2021-12-25 DIAGNOSIS — K3580 Unspecified acute appendicitis: Secondary | ICD-10-CM | POA: Diagnosis not present

## 2021-12-25 DIAGNOSIS — Z91128 Patient's intentional underdosing of medication regimen for other reason: Secondary | ICD-10-CM | POA: Diagnosis not present

## 2021-12-25 DIAGNOSIS — K30 Functional dyspepsia: Secondary | ICD-10-CM | POA: Diagnosis not present

## 2021-12-25 DIAGNOSIS — K567 Ileus, unspecified: Secondary | ICD-10-CM | POA: Diagnosis not present

## 2021-12-25 DIAGNOSIS — E1169 Type 2 diabetes mellitus with other specified complication: Secondary | ICD-10-CM | POA: Diagnosis not present

## 2021-12-25 DIAGNOSIS — R1010 Upper abdominal pain, unspecified: Secondary | ICD-10-CM | POA: Diagnosis not present

## 2021-12-25 DIAGNOSIS — J9811 Atelectasis: Secondary | ICD-10-CM | POA: Diagnosis not present

## 2021-12-25 HISTORY — DX: Type 2 diabetes mellitus without complications: E11.9

## 2021-12-25 HISTORY — DX: Gastro-esophageal reflux disease without esophagitis: K21.9

## 2021-12-25 HISTORY — DX: Acute pancreatitis without necrosis or infection, unspecified: K85.90

## 2021-12-25 LAB — CBC
HCT: 46.7 % (ref 39.0–52.0)
Hemoglobin: 15.7 g/dL (ref 13.0–17.0)
MCH: 31 pg (ref 26.0–34.0)
MCHC: 33.6 g/dL (ref 30.0–36.0)
MCV: 92.1 fL (ref 80.0–100.0)
Platelets: 286 10*3/uL (ref 150–400)
RBC: 5.07 MIL/uL (ref 4.22–5.81)
RDW: 13.4 % (ref 11.5–15.5)
WBC: 12.8 10*3/uL — ABNORMAL HIGH (ref 4.0–10.5)
nRBC: 0 % (ref 0.0–0.2)

## 2021-12-25 LAB — LIPID PANEL
Cholesterol: 200 mg/dL (ref 0–200)
HDL: 63 mg/dL (ref >40)
LDL Cholesterol: 104 mg/dL — ABNORMAL HIGH (ref 0–99)
Total CHOL/HDL Ratio: 3.2 RATIO
Triglycerides: 164 mg/dL — ABNORMAL HIGH (ref <150)
VLDL: 33 mg/dL (ref 0–40)

## 2021-12-25 LAB — HEPATITIS PANEL, ACUTE
HCV Ab: NONREACTIVE
Hep A IgM: NONREACTIVE
Hep B C IgM: NONREACTIVE
Hepatitis B Surface Ag: NONREACTIVE

## 2021-12-25 LAB — TROPONIN I (HIGH SENSITIVITY)
Troponin I (High Sensitivity): 4 ng/L (ref <18)
Troponin I (High Sensitivity): 3 ng/L (ref <18)

## 2021-12-25 LAB — LIPASE, BLOOD: Lipase: 994 U/L — ABNORMAL HIGH (ref 11–51)

## 2021-12-25 LAB — BASIC METABOLIC PANEL
Anion gap: 12 (ref 5–15)
BUN: 20 mg/dL (ref 6–20)
CO2: 23 mmol/L (ref 22–32)
Calcium: 9 mg/dL (ref 8.9–10.3)
Chloride: 101 mmol/L (ref 98–111)
Creatinine, Ser: 0.87 mg/dL (ref 0.61–1.24)
GFR, Estimated: >60 mL/min (ref >60)
Glucose, Bld: 226 mg/dL — ABNORMAL HIGH (ref 70–99)
Potassium: 3.5 mmol/L (ref 3.5–5.1)
Sodium: 136 mmol/L (ref 135–145)

## 2021-12-25 LAB — HEPATIC FUNCTION PANEL
ALT: 979 U/L — ABNORMAL HIGH (ref 0–44)
AST: 448 U/L — ABNORMAL HIGH (ref 15–41)
Albumin: 4 g/dL (ref 3.5–5.0)
Alkaline Phosphatase: 196 U/L — ABNORMAL HIGH (ref 38–126)
Bilirubin, Direct: 2.9 mg/dL — ABNORMAL HIGH (ref 0.0–0.2)
Indirect Bilirubin: 1.7 mg/dL — ABNORMAL HIGH (ref 0.3–0.9)
Total Bilirubin: 4.6 mg/dL — ABNORMAL HIGH (ref 0.3–1.2)
Total Protein: 7.6 g/dL (ref 6.5–8.1)

## 2021-12-25 LAB — HEMOGLOBIN A1C
Hgb A1c MFr Bld: 7.7 % — ABNORMAL HIGH (ref 4.8–5.6)
Mean Plasma Glucose: 174.29 mg/dL

## 2021-12-25 LAB — SURGICAL PCR SCREEN
MRSA, PCR: NEGATIVE
Staphylococcus aureus: POSITIVE — AB

## 2021-12-25 LAB — HIV ANTIBODY (ROUTINE TESTING W REFLEX): HIV Screen 4th Generation wRfx: NONREACTIVE

## 2021-12-25 IMAGING — CT CT ABD-PELV W/O CM
2 of 4 series · 15 of 46 positions shown, 17 images · non-contrast
Comparison: [DATE]

CLINICAL DATA: Right lower quadrant pain x3 days, umbilical hernia



[Series 2: axial st · axial · 0.98mm/px · z∈[+624,+1144]mm · 12 of 116 slices shown, 14 images]
[im 6/116  soft-tissue]
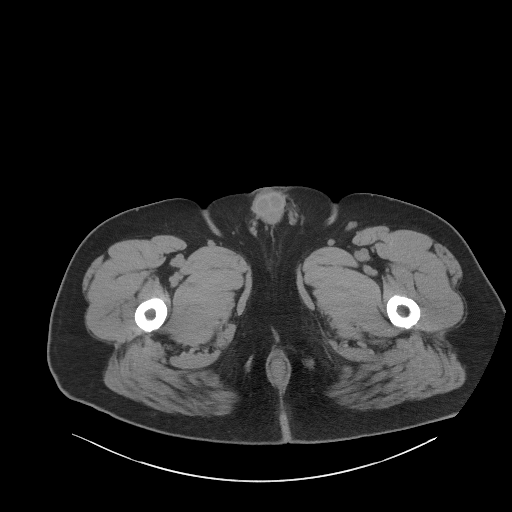
[im 6/116  bone]
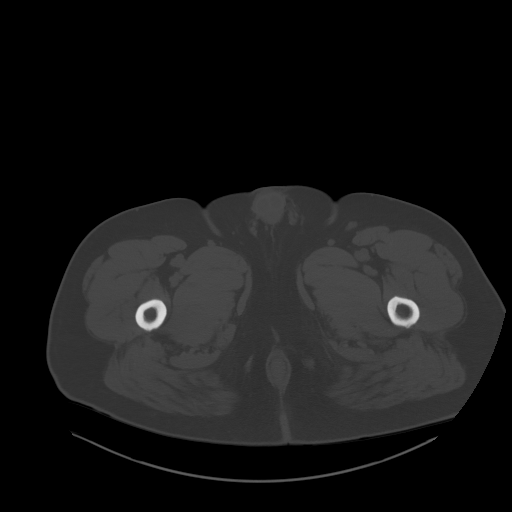
[im 18/116  soft-tissue]
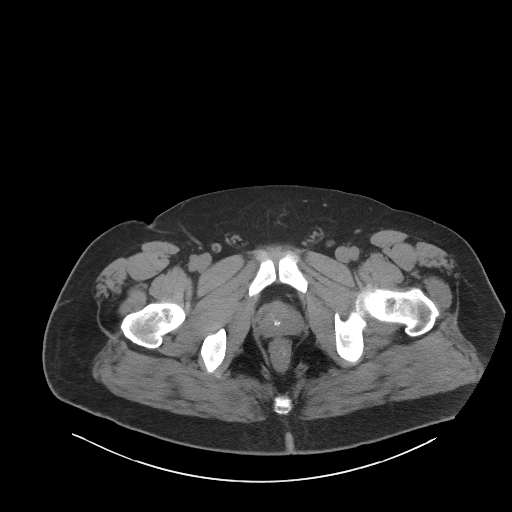
[im 24/116  soft-tissue]
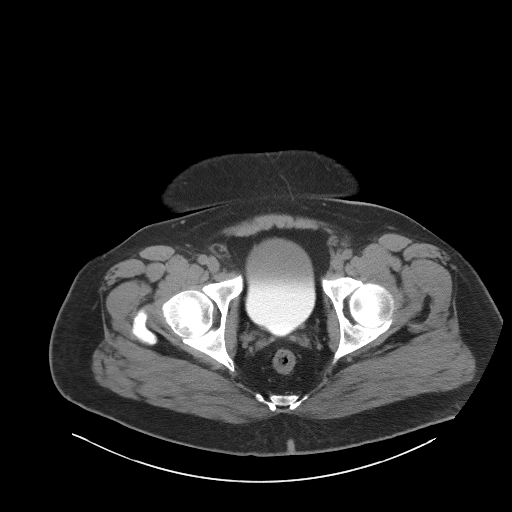
[im 35/116  soft-tissue]
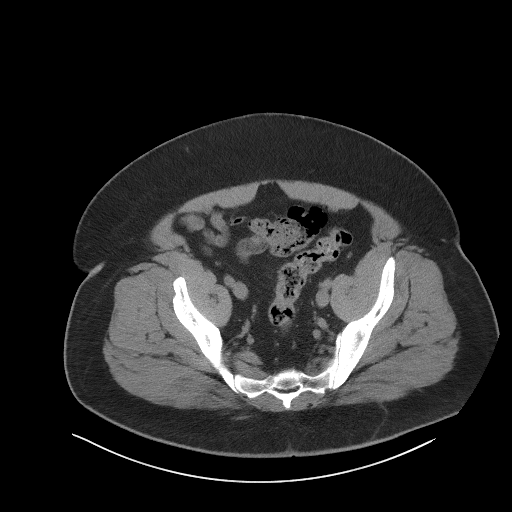
[im 47/116  soft-tissue]
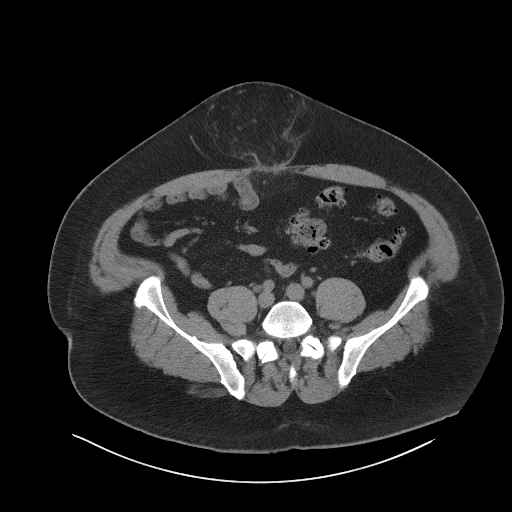
[im 52/116  soft-tissue]
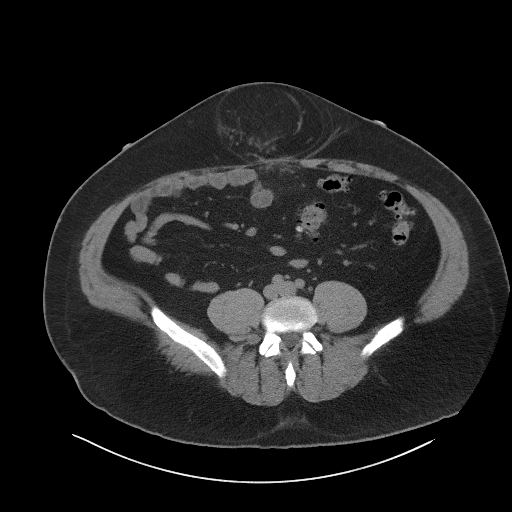
[im 64/116  soft-tissue]
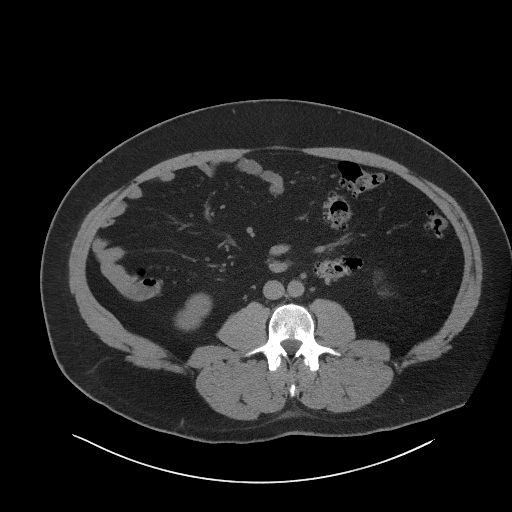
[im 70/116  soft-tissue]
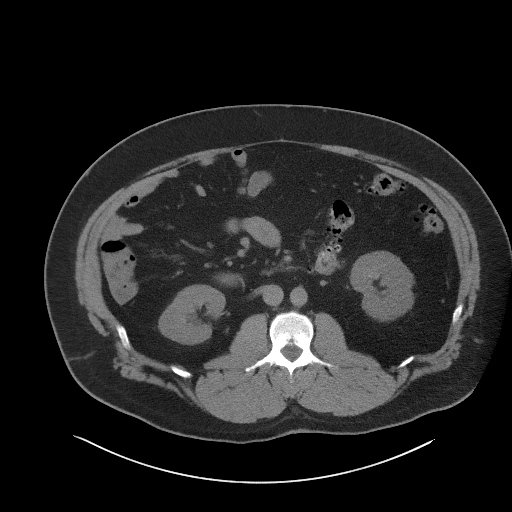
[im 81/116  soft-tissue]
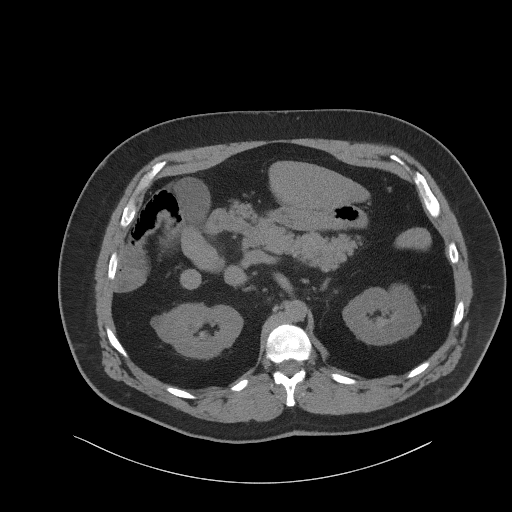
[im 81/116  bone]
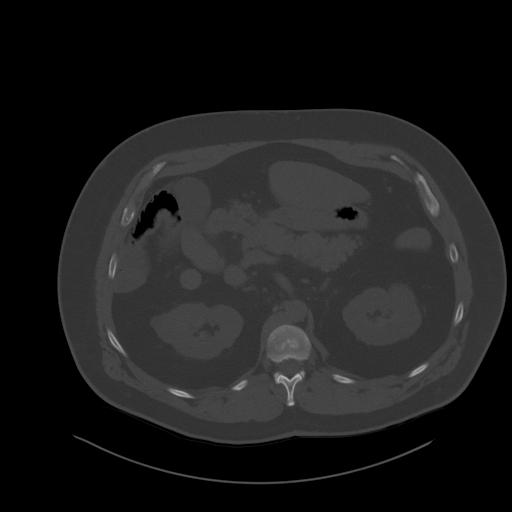
[im 93/116  soft-tissue]
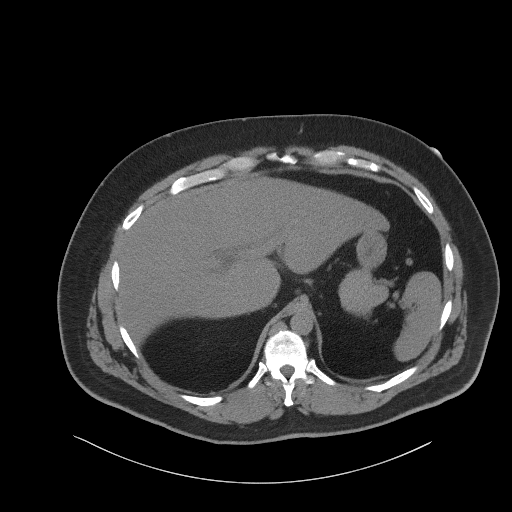
[im 98/116  soft-tissue]
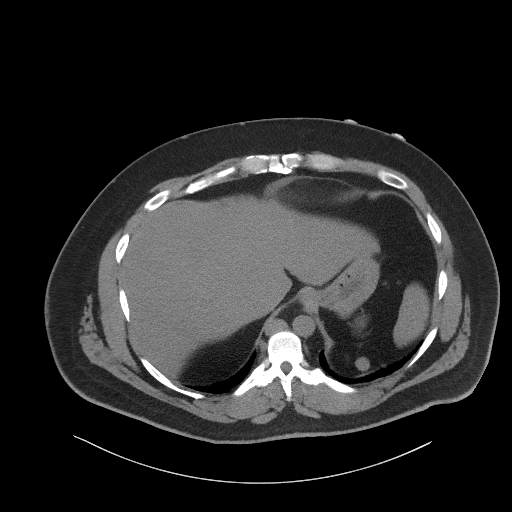
[im 110/116  soft-tissue]
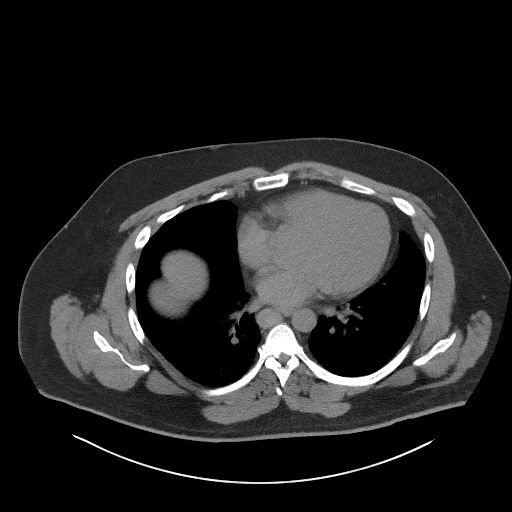

[Series 5: coronal st · coronal · 1.10mm/px · 3 of 143 slices shown]
[im 48/143  soft-tissue]
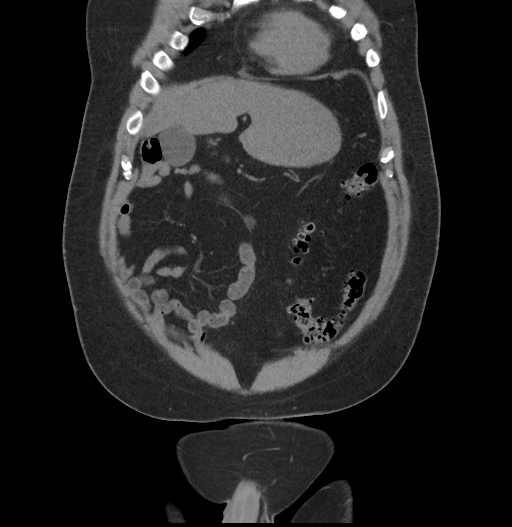
[im 64/143  soft-tissue]
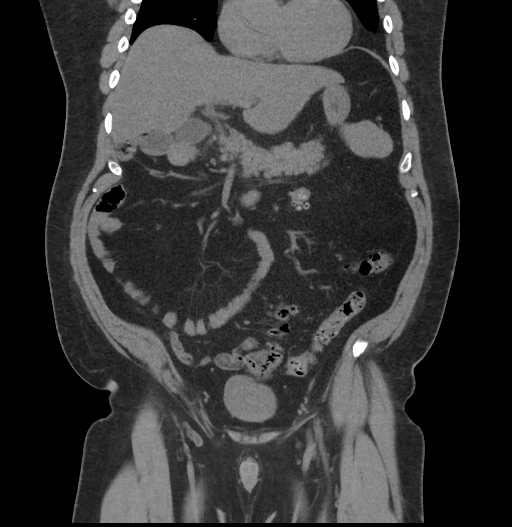
[im 79/143  soft-tissue]
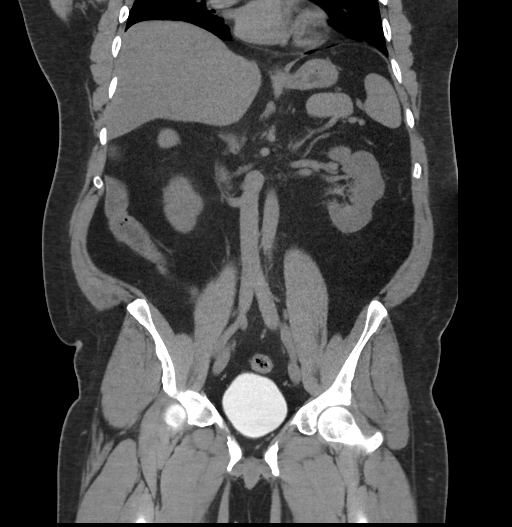

[15 of 46 positions shown; findings below may reference images not displayed]

FINDINGS: Lower chest: Small linear densities seen in the lower lung fields
suggesting scarring or minimal subsegmental atelectasis.

Hepatobiliary: No focal abnormality is seen in the liver. There is
no dilation of bile ducts. There is mild stranding in the fat planes
adjacent to gallbladder. Gallbladder is not distended.

Pancreas: There is mild stranding in the peripancreatic fat. There
are no loculated fluid collections in or around the pancreas. There
is no dilation of pancreatic duct.

Spleen: Unremarkable.

Adrenals/Urinary Tract: Adrenals are unremarkable. There is no
hydronephrosis. There is subtle increase in density in the some of
the minor calices. Significance of this finding is not clear. This
may be related to patient's hydration status or suggest presence of
tiny renal stones. No discrete measurable calculi are noted. There
are few smooth marginated low-density lesions in the kidneys largest
in the upper pole of right kidney measuring 3.1 cm. Findings suggest
possible renal cysts. Some of the smaller lesions could not be
optimally characterized.

Stomach/Bowel: Stomach is not distended. Small bowel loops are not
dilated. Most of the small bowel loops are noted in the right side
of the abdomen with most of the colon lying in the left side of the
abdomen suggesting congenital malrotation. Appendix is not dilated.
Numerous diverticula are seen in the colon without signs of focal
acute diverticulitis.

Vascular/Lymphatic: There is azygous continuation of inferior vena
cava.

Reproductive: There are few coarse calcifications in the prostate.

Other: There is no ascites or pneumoperitoneum. There is significant
interval increase in size of umbilical hernia. Umbilical hernial sac
measures proximally 13.5 x 11.1 x 8.7 cm. There is fat within the
large umbilical hernia. No bowel loops are noted. There is minimal
stranding in the fat planes within the hernia without any loculated
fluid collections.

Musculoskeletal: No acute findings are seen.
IMPRESSION: There is stranding in the fat planes adjacent to pancreas and
gallbladder. Findings suggest possible mild pancreatitis and
possibly cholecystitis. Please correlate with clinical symptoms and
laboratory findings and consider follow-up sonogram. There is no
dilation of bile ducts.

There is large umbilical hernia measuring 13.5 cm in maximum
diameter containing fat. There is minimal stranding in the fat
planes in the hernial sac which may suggest mild acute or chronic
inflammation. There is no loculated fluid collection within the
umbilical hernia. There are no bowel loops within the hernia.

Diverticulosis of colon without signs of focal diverticulitis.
Appendix is not dilated.

Other findings as described in the body of the report.

## 2021-12-25 IMAGING — DX DG CHEST 2V
2 series · 2 of 2 positions shown · non-contrast
Comparison: None Available.

CLINICAL DATA: Chest pain.

EXAM:
CHEST - 2 VIEW

[chest pa]
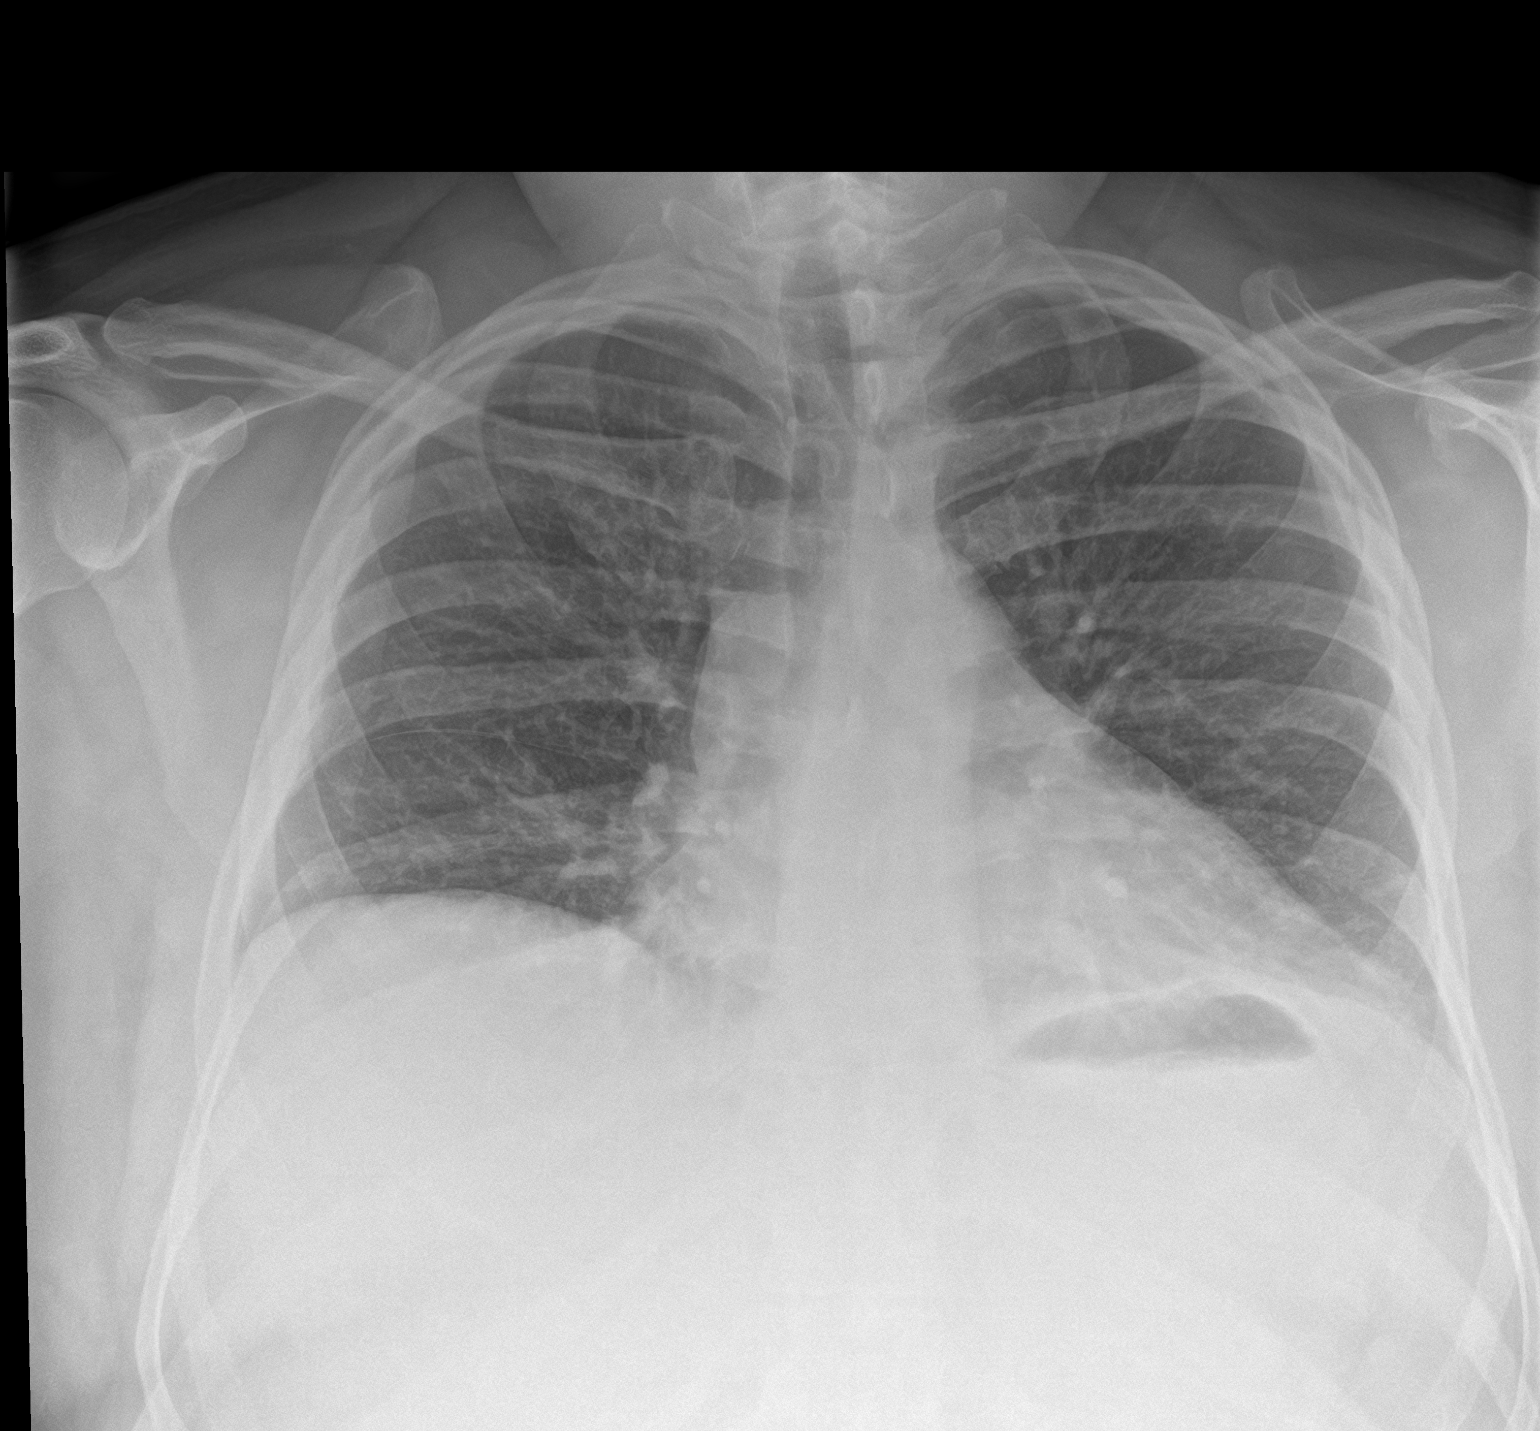

[chest lat]
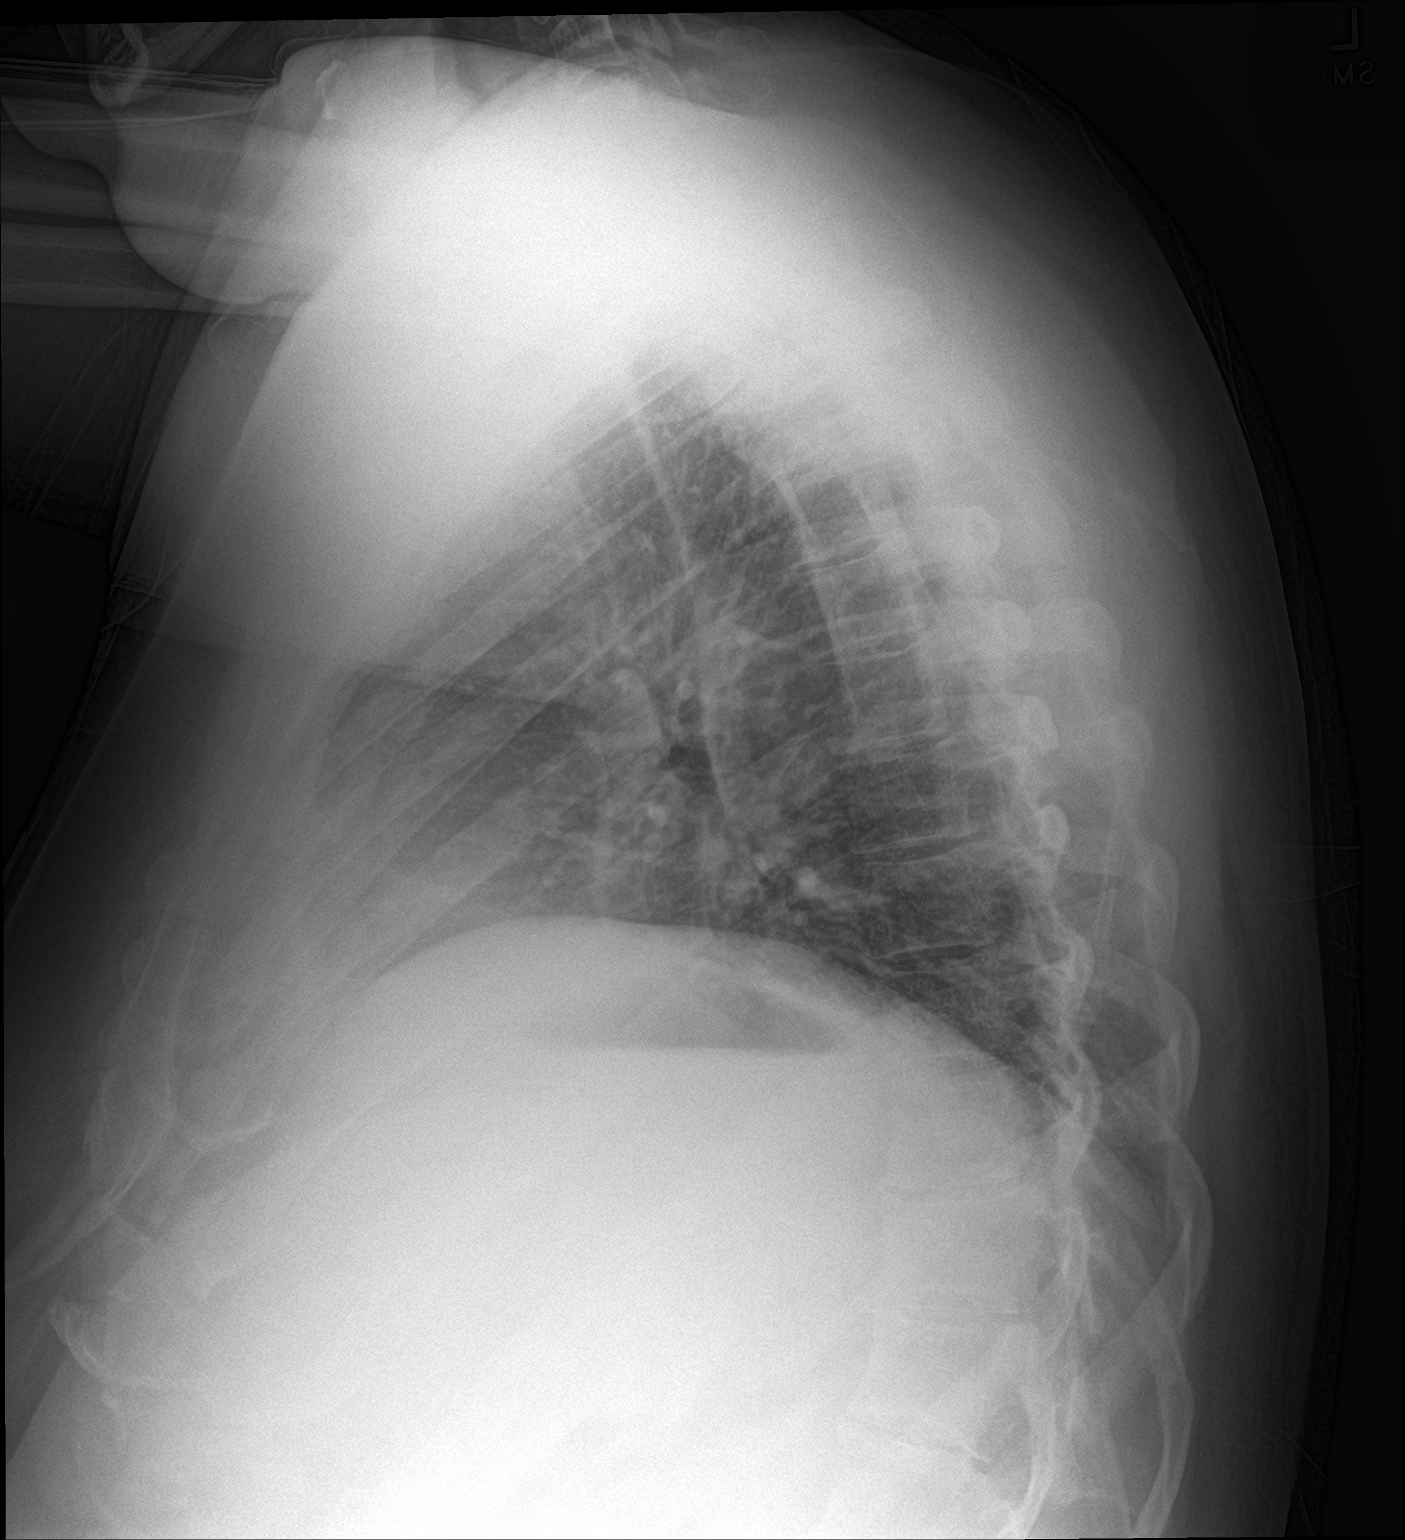

[2 of 2 positions shown; findings below may reference images not displayed]

FINDINGS: Shallow inspiration minimal left lung base atelectasis. No focal
consolidation, pleural effusion, or pneumothorax. The cardiac
silhouette is within normal limits. No acute osseous pathology.
IMPRESSION: No active cardiopulmonary disease.

## 2021-12-25 IMAGING — US US ABDOMEN LIMITED
1 series · 14 of 25 positions shown · non-contrast
Comparison: [DATE] CT abdomen/pelvis

CLINICAL DATA: Postprandial upper abdominal pain for 4 days

EXAM:
ULTRASOUND ABDOMEN LIMITED RIGHT UPPER QUADRANT

[Series 1: us abdomen limited ruq (liver/gb) · 14 of 74 slices shown]
[im 1/74]
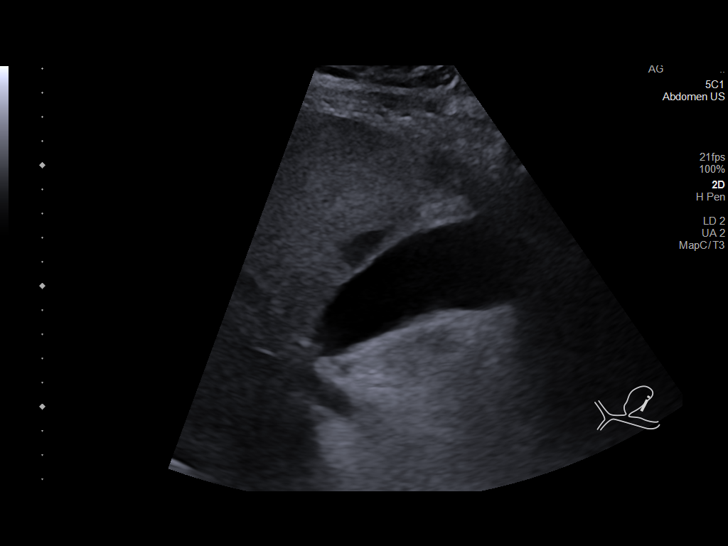
[im 7/74]
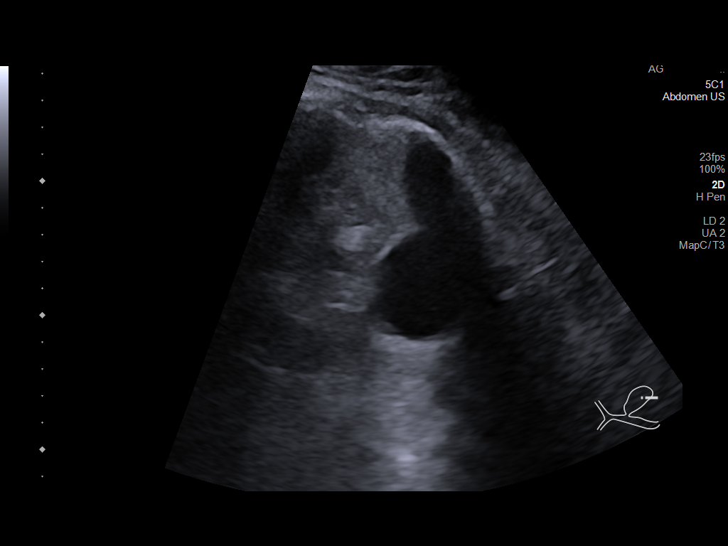
[im 13/74]
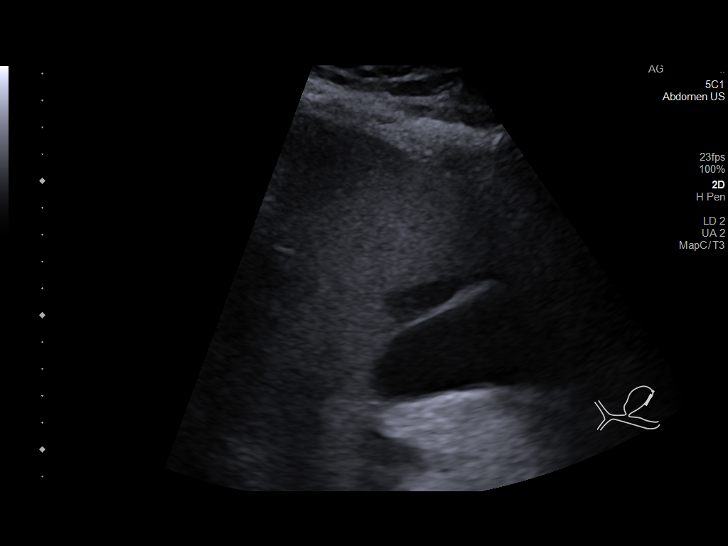
[im 19/74]
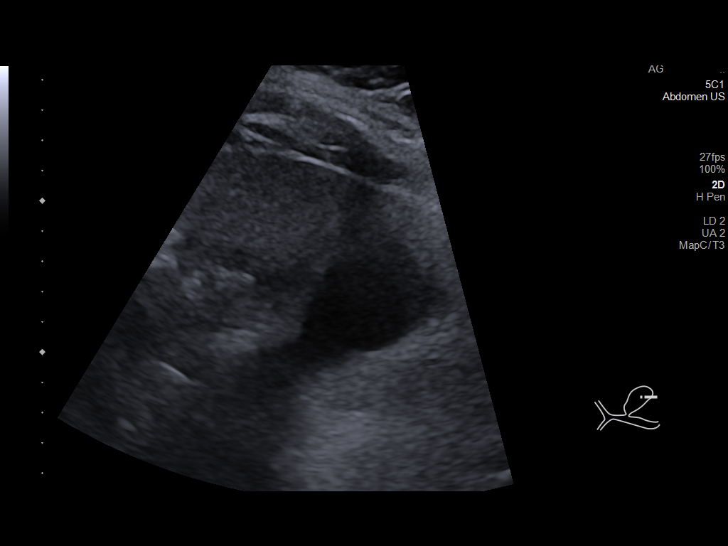
[im 25/74]
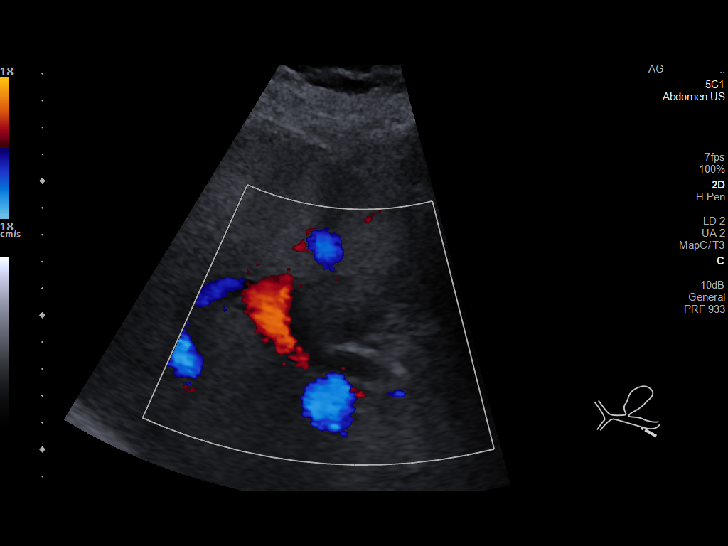
[im 28/74]
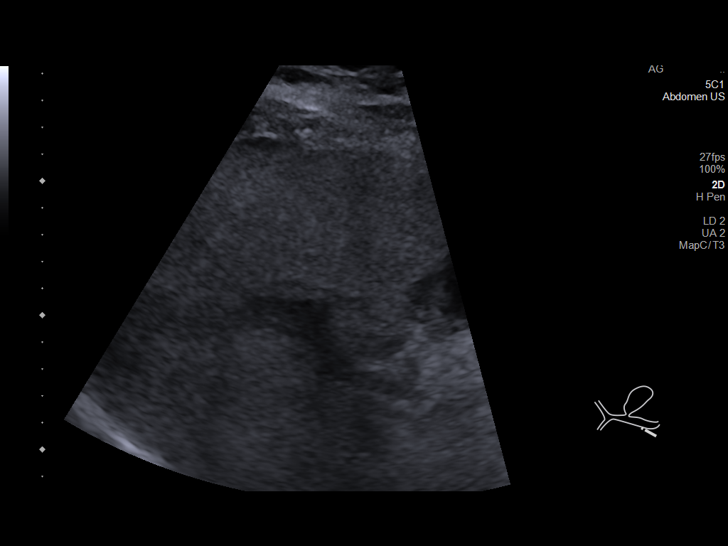
[im 34/74]
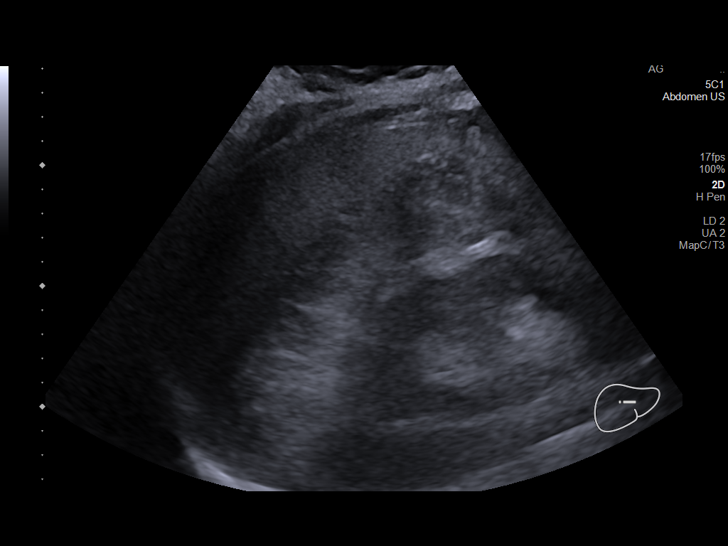
[im 40/74]
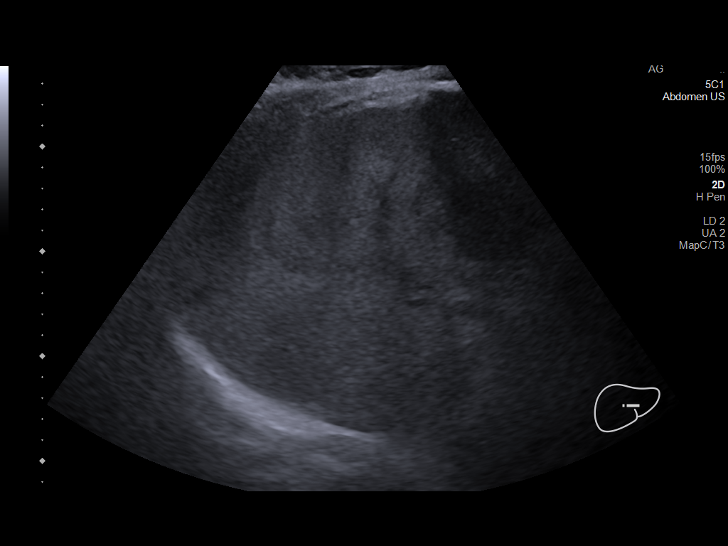
[im 46/74]
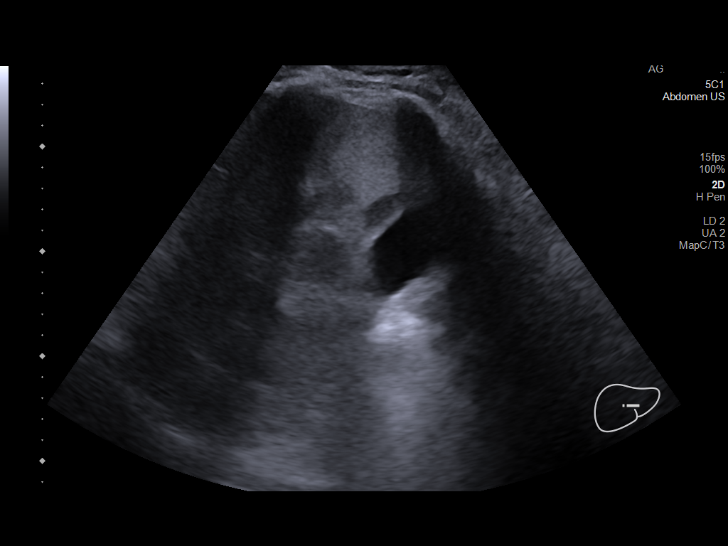
[im 49/74]
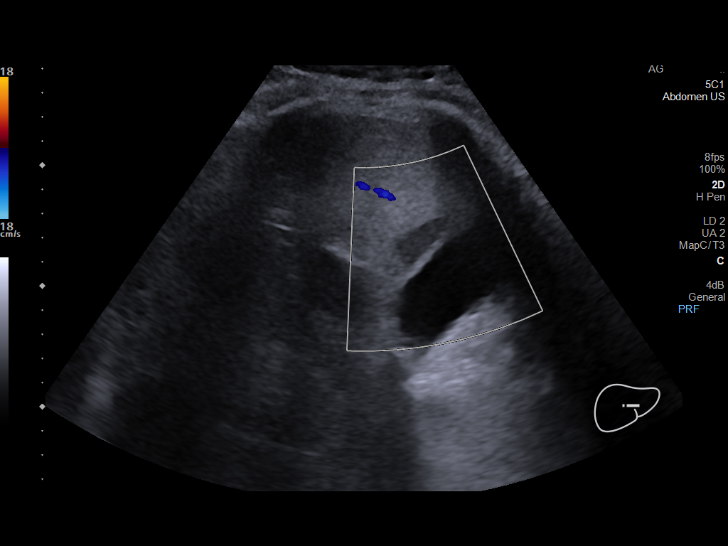
[im 55/74]
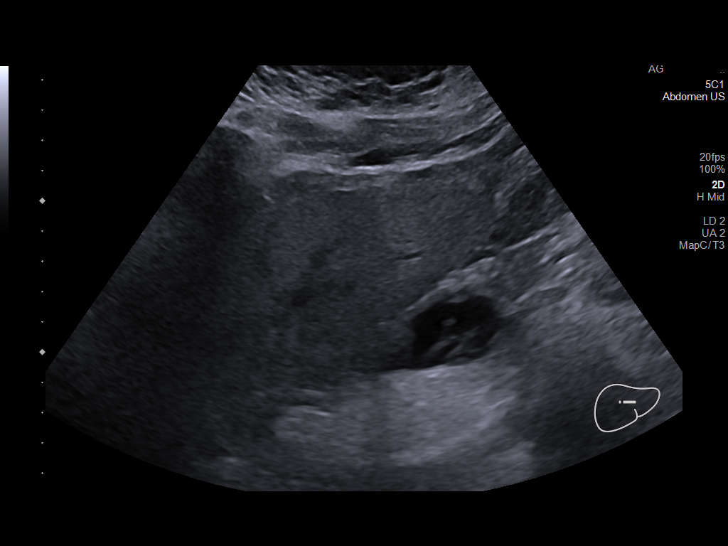
[im 61/74]
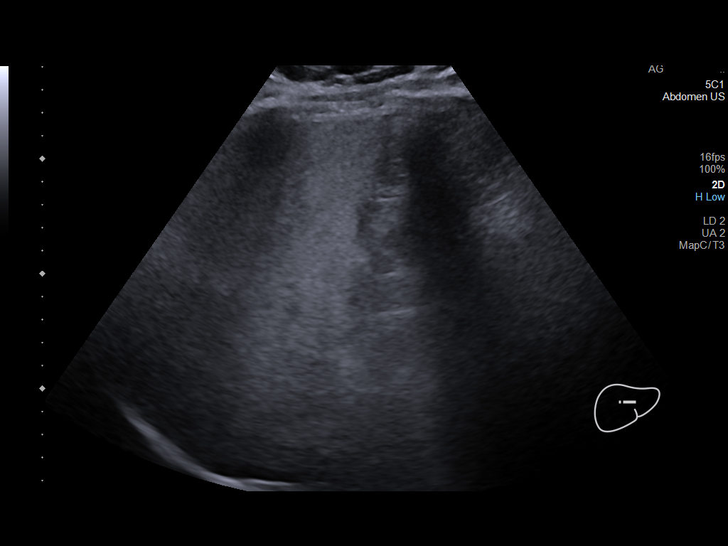
[im 67/74]
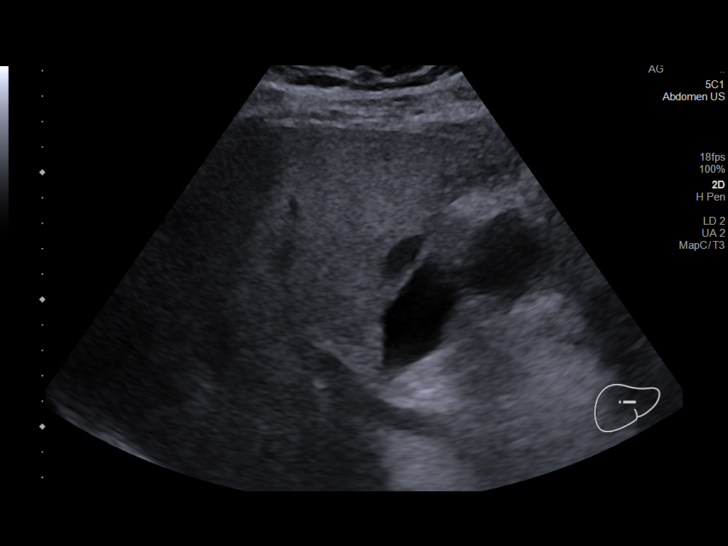
[im 74/74]
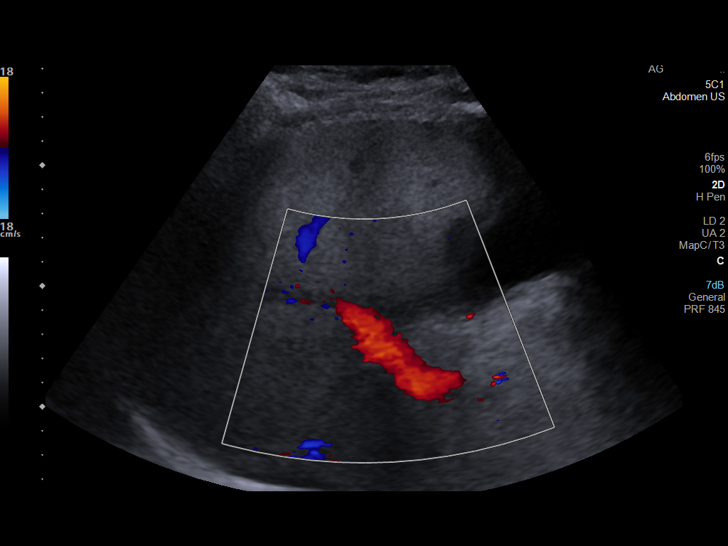

[14 of 25 positions shown; findings below may reference images not displayed]

FINDINGS: Gallbladder:

No gallstones or wall thickening visualized. No sonographic Murphy
sign noted by sonographer.

Common bile duct:

Diameter: 7 mm

Liver:

Diffusely echogenic liver parenchyma with posterior acoustic
attenuation, compatible with diffuse hepatic steatosis. No definite
liver surface irregularity. Crescentic subcapsular hypoechoic liver
focus adjacent to the gallbladder is compatible with focal fatty
sparing. No liver masses, noting decreased sensitivity in the
setting of an echogenic liver. Portal vein is patent on color
Doppler imaging with normal direction of blood flow towards the
liver.

Other: None.
IMPRESSION: 1. Mild common bile duct dilation (7 mm diameter). Recommend
correlation with serum bilirubin level. MRI abdomen without and with
IV contrast with MRCP may be considered as clinically warranted.
2. Normal gallbladder, with no cholelithiasis.
3. Diffuse hepatic steatosis with focal fatty sparing adjacent to
the gallbladder.

## 2021-12-25 IMAGING — MR MR ABDOMEN WO/W CM MRCP
21 of 22 series · 45 of 48 positions shown · IV contrast (10 ml Gadavist)
Comparison: Ultrasound [DATE] and CT [DATE]

CLINICAL DATA: Pancreatitis, acute, severe right upper quadrant
abdominal pain.

EXAM:
MRI ABDOMEN WITHOUT AND WITH CONTRAST (INCLUDING MRCP)
TECHNIQUE: Multiplanar multisequence MR imaging of the abdomen was performed
both before and after the administration of intravenous contrast.
Heavily T2-weighted images of the biliary and pancreatic ducts were
obtained, and three-dimensional MRCP images were rendered by post
processing.
CONTRAST:  10mL GADAVIST GADOBUTROL 1 MMOL/ML IV SOLN

[Series 3: cor haste · coronal · 6.0mm · 1.41mm/px · 1 of 37 slices shown]
[im 1/37]
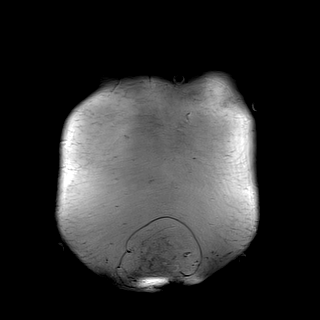

[Series 4: T2 fat-sat · axial · 6.0mm · 1.41mm/px · 1 of 40 slices shown]
[im 1/40]
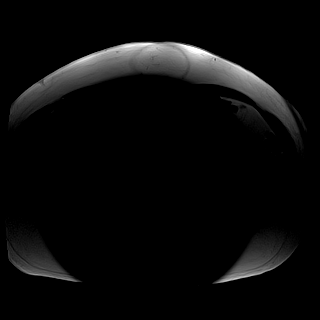

[Series 5: DWI · axial · 6.0mm · 1.68mm/px · 1 of 36 slices shown (1 of 4)]
[im 1/36]
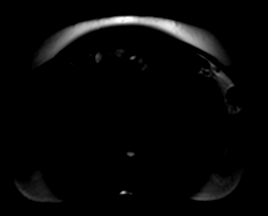

[Series 5: DWI · axial · 6.0mm · 1.68mm/px · 1 of 36 slices shown (2 of 4)]
[im 1/36]
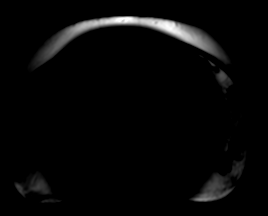

[Series 5: DWI · axial · 6.0mm · 1.68mm/px · 1 of 36 slices shown (3 of 4)]
[im 1/36]
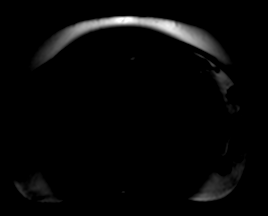

[Series 6: DWI · axial · 6.0mm · 1.68mm/px · 1 of 36 slices shown (4 of 4)]
[im 1/36]
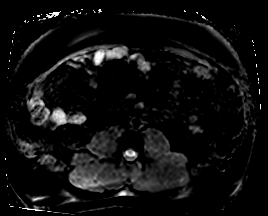

[Series 10: ax in and · axial · 3.5mm · 1.41mm/px · z∈[-145,+132]mm · 3 of 80 slices shown (1 of 2)]
[im 1/80]
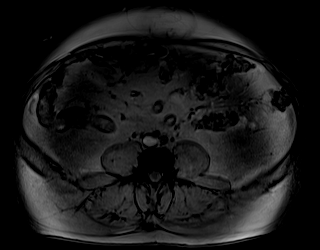
[im 40/80]
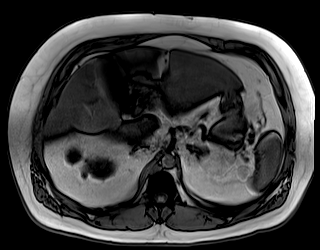
[im 80/80]
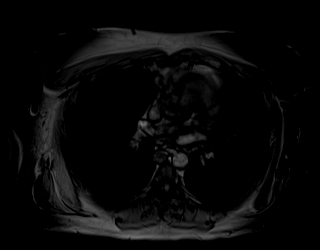

[Series 11: ax in and · axial · 3.5mm · 1.41mm/px · z∈[-145,+132]mm · 3 of 80 slices shown (2 of 2)]
[im 1/80]
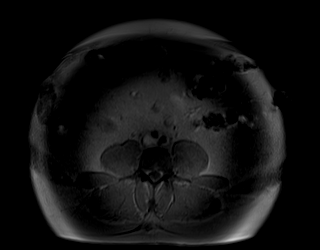
[im 40/80]
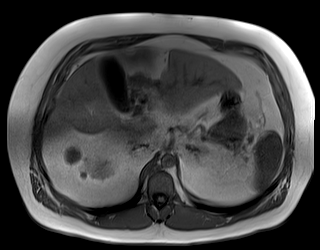
[im 80/80]
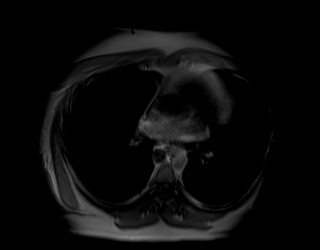

[Series 12: ax haste bh · axial · 6.0mm · 1.41mm/px · 1 of 40 slices shown]
[im 1/40]
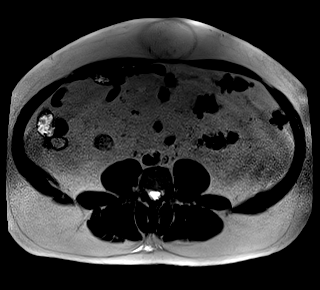

[Series 13: MRCP · coronal · 50.0mm · 0.78mm/px · 1 of 5 slices shown (1 of 2)]
[im 1/5]
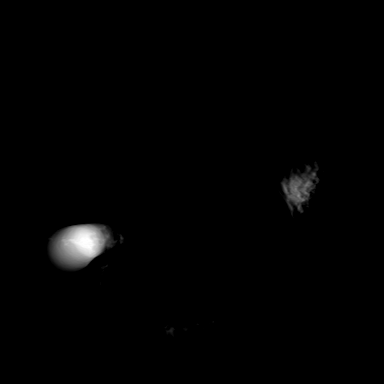

[Series 14: MRCP · coronal · 4.0mm · 1.12mm/px · 1 of 25 slices shown (2 of 2)]
[im 1/25]
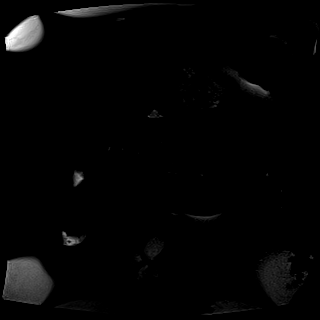

[Series 15: T1 dynamic · axial · 3.5mm · 1.41mm/px · z∈[-145,+132]mm · 3 of 80 slices shown]
[im 1/80]
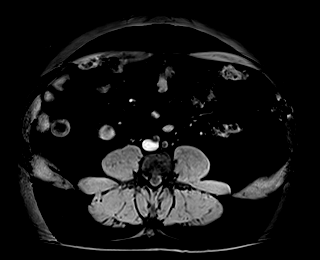
[im 40/80]
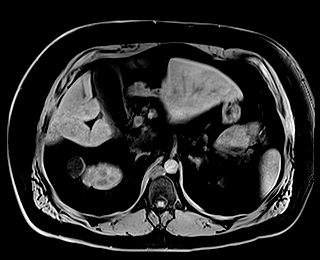
[im 80/80]
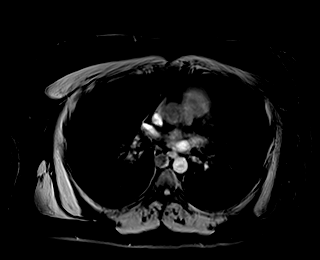

[Series 16: T1 dynamic post-contrast · axial · 3.5mm · 1.41mm/px · z∈[-145,+132]mm · 3 of 80 slices shown (1 of 9)]
[im 1/80]
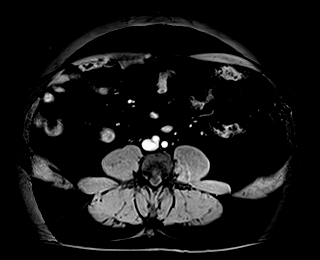
[im 40/80]
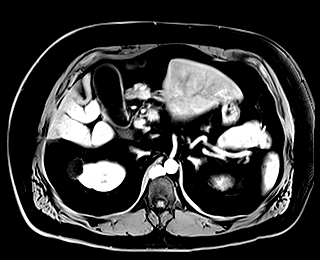
[im 80/80]
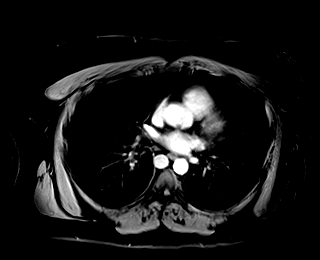

[Series 17: T1 dynamic post-contrast · axial · 3.5mm · 1.41mm/px · z∈[-145,+132]mm · 3 of 80 slices shown (2 of 9)]
[im 1/80]
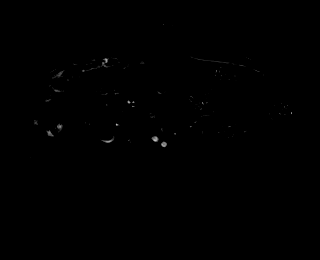
[im 40/80]
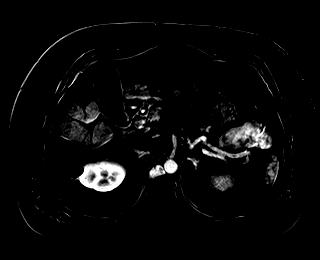
[im 80/80]
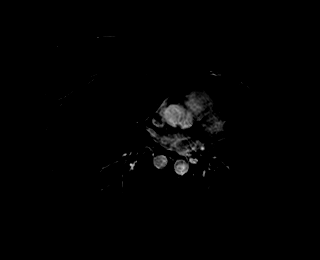

[Series 18: T1 dynamic post-contrast · axial · 3.5mm · 1.41mm/px · z∈[-145,+132]mm · 3 of 80 slices shown (3 of 9)]
[im 1/80]
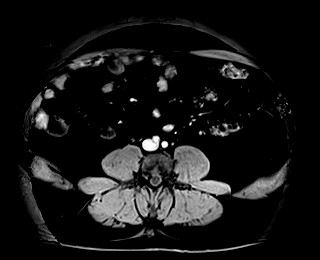
[im 40/80]
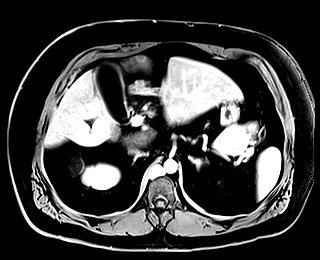
[im 80/80]
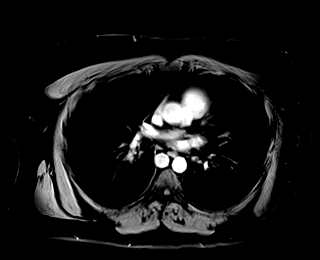

[Series 19: T1 dynamic post-contrast · axial · 3.5mm · 1.41mm/px · z∈[-145,+132]mm · 3 of 80 slices shown (4 of 9)]
[im 1/80]
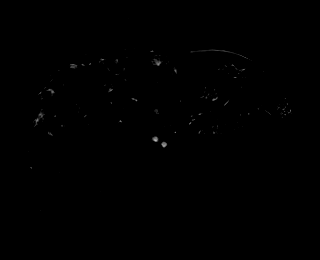
[im 40/80]
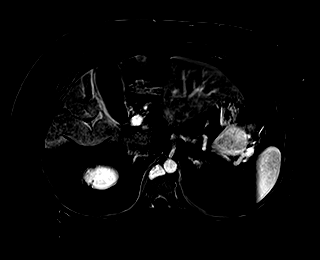
[im 80/80]
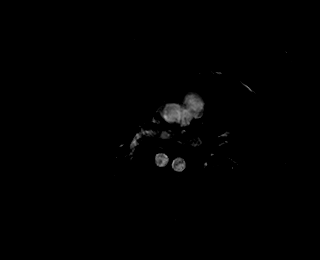

[Series 20: T1 dynamic post-contrast · axial · 3.5mm · 1.41mm/px · z∈[-145,+132]mm · 3 of 80 slices shown (5 of 9)]
[im 1/80]
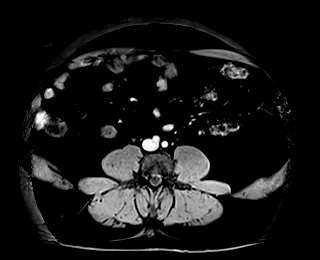
[im 40/80]
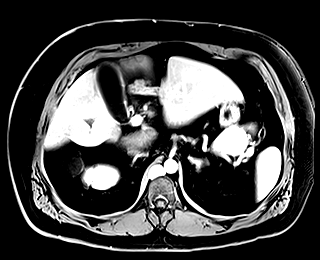
[im 80/80]
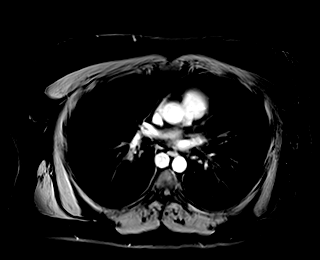

[Series 21: T1 dynamic post-contrast · axial · 3.5mm · 1.41mm/px · z∈[-145,+132]mm · 3 of 80 slices shown (6 of 9)]
[im 1/80]
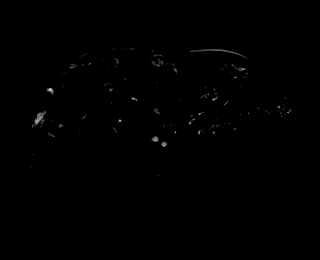
[im 40/80]
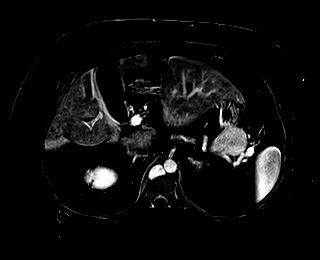
[im 80/80]
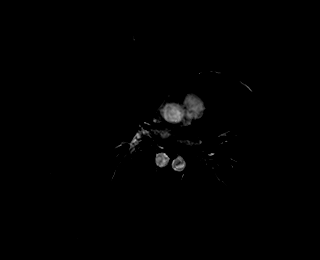

[Series 22: T1 dynamic post-contrast · coronal · 3.0mm · 1.41mm/px · 3 of 72 slices shown (7 of 9)]
[im 1/72]
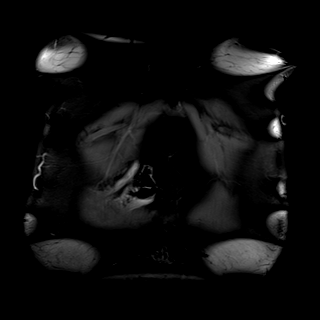
[im 36/72]
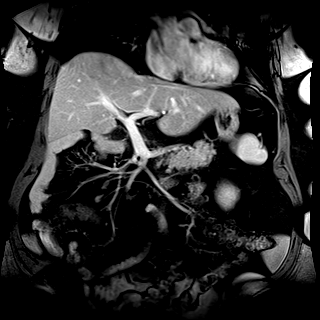
[im 72/72]
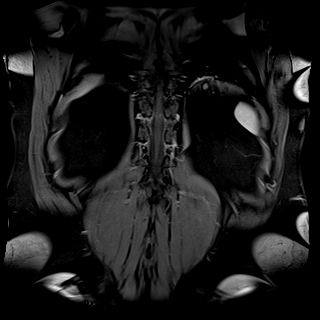

[Series 23: T1 dynamic post-contrast · axial · 3.5mm · 1.41mm/px · z∈[-145,+132]mm · 3 of 80 slices shown (8 of 9)]
[im 1/80]
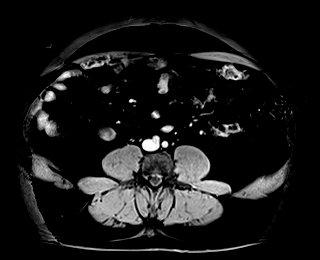
[im 40/80]
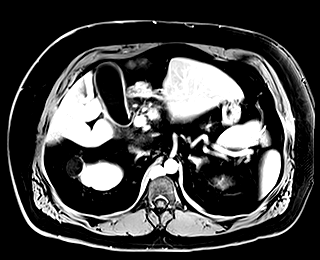
[im 80/80]
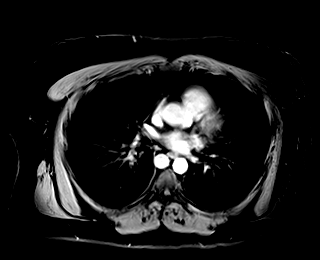

[Series 24: T1 dynamic post-contrast · axial · 3.5mm · 1.41mm/px · z∈[-145,+132]mm · 3 of 80 slices shown (9 of 9)]
[im 1/80]
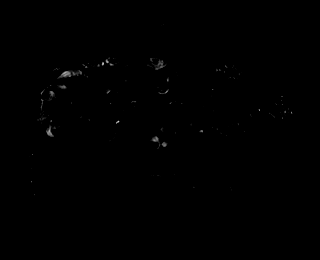
[im 40/80]
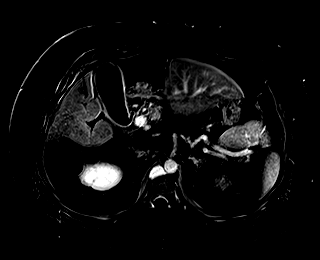
[im 80/80]
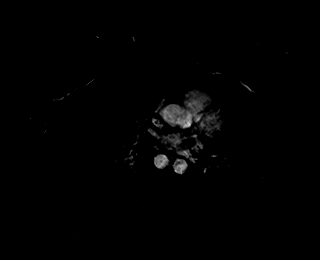

[45 of 48 positions shown; findings below may reference images not displayed]

FINDINGS: Lower chest: No acute abnormality.

Hepatobiliary: Diffuse hepatic steatosis. No suspicious hepatic
lesion.

Tiny cholelithiasis without evidence of acute cholecystitis. No
biliary ductal dilation or choledocholithiasis.

Pancreas: Inflammation about the pancreatic centered along the
pancreaticoduodenal groove without nonenhancing portions of
pancreatic parenchyma, pancreatic ductal dilation or walled off
fluid collection.

Spleen: No splenomegaly or focal splenic lesion. Left upper quadrant
splenules.

Adrenals/Urinary Tract: Bilateral adrenal glands appear normal. No
hydronephrosis. Fluid intensity benign right renal cysts require no
imaging follow-up. No suspicious renal mass.

Stomach/Bowel: Inflammation along the proximal duodenum is favored
reactive. Extensive colonic diverticulosis without findings of acute
diverticulitis.

Vascular/Lymphatic: No pathologically enlarged lymph nodes
identified. No abdominal aortic aneurysm demonstrated.

Other:  No significant abdominal free fluid.

Musculoskeletal: No suspicious bone lesions identified.
IMPRESSION: 1. Acute interstitial pancreatitis with reactive duodenitis. No
walled off fluid collection or pancreatic ductal dilation.
2. Cholelithiasis without findings of acute cholecystitis. No
choledocholithiasis or biliary ductal dilation.

## 2021-12-25 IMAGING — MR MR 3D RECON AT [facility]NER
21 of 22 series · 45 of 48 positions shown · IV contrast (10 ml Gadavist)
Comparison: Ultrasound [DATE] and CT [DATE]

CLINICAL DATA: Pancreatitis, acute, severe right upper quadrant
abdominal pain.

EXAM:
MRI ABDOMEN WITHOUT AND WITH CONTRAST (INCLUDING MRCP)
TECHNIQUE: Multiplanar multisequence MR imaging of the abdomen was performed
both before and after the administration of intravenous contrast.
Heavily T2-weighted images of the biliary and pancreatic ducts were
obtained, and three-dimensional MRCP images were rendered by post
processing.
CONTRAST:  10mL GADAVIST GADOBUTROL 1 MMOL/ML IV SOLN

[Series 3: cor haste · coronal · 6.0mm · 1.41mm/px · 1 of 37 slices shown]
[im 1/37]
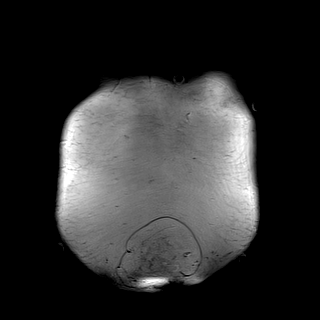

[Series 4: T2 fat-sat · axial · 6.0mm · 1.41mm/px · 1 of 40 slices shown]
[im 1/40]
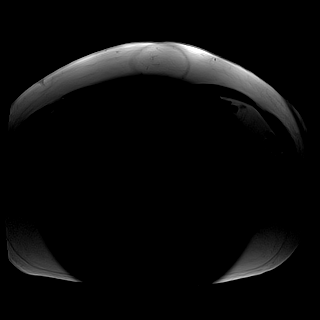

[Series 5: DWI · axial · 6.0mm · 1.68mm/px · 1 of 36 slices shown (1 of 4)]
[im 1/36]
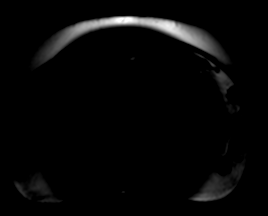

[Series 5: DWI · axial · 6.0mm · 1.68mm/px · 1 of 36 slices shown (2 of 4)]
[im 1/36]
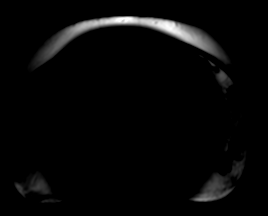

[Series 5: DWI · axial · 6.0mm · 1.68mm/px · 1 of 36 slices shown (3 of 4)]
[im 1/36]
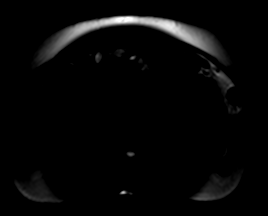

[Series 6: DWI · axial · 6.0mm · 1.68mm/px · 1 of 36 slices shown (4 of 4)]
[im 1/36]
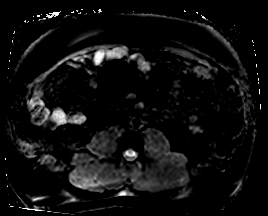

[Series 9: ax in and · axial · 3.5mm · 1.41mm/px · z∈[-145,+132]mm · 3 of 80 slices shown (1 of 2)]
[im 1/80]
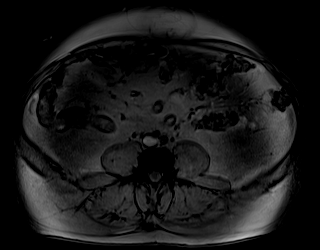
[im 40/80]
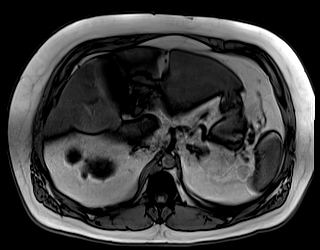
[im 80/80]
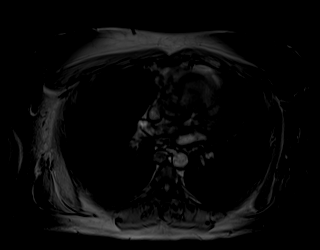

[Series 10: ax in and · axial · 3.5mm · 1.41mm/px · z∈[-145,+132]mm · 3 of 80 slices shown (2 of 2)]
[im 1/80]
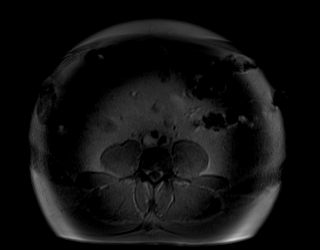
[im 40/80]
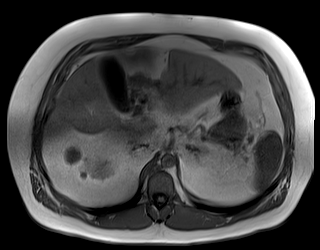
[im 80/80]
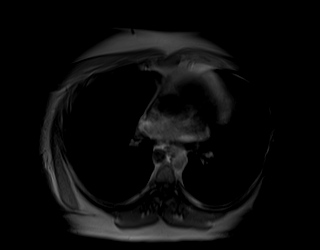

[Series 11: ax haste bh · axial · 6.0mm · 1.41mm/px · 1 of 40 slices shown]
[im 1/40]
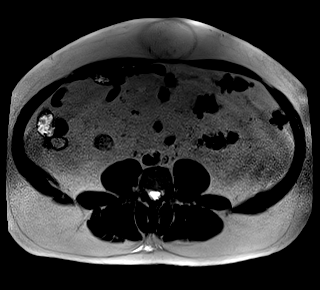

[Series 12: MRCP · coronal · 50.0mm · 0.78mm/px · 1 of 5 slices shown (1 of 2)]
[im 1/5]
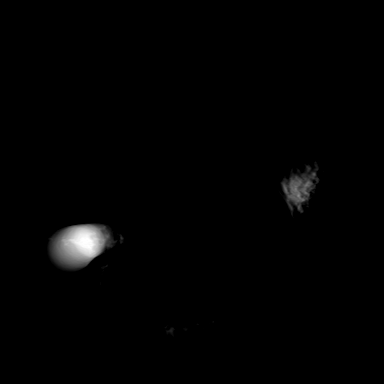

[Series 13: MRCP · coronal · 4.0mm · 1.12mm/px · 1 of 25 slices shown (2 of 2)]
[im 1/25]
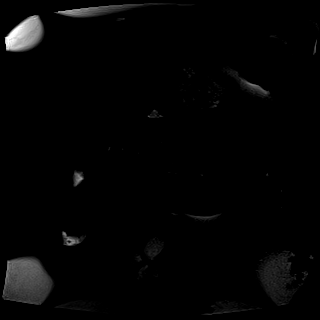

[Series 14: T1 dynamic · axial · 3.5mm · 1.41mm/px · z∈[-145,+132]mm · 3 of 80 slices shown]
[im 1/80]
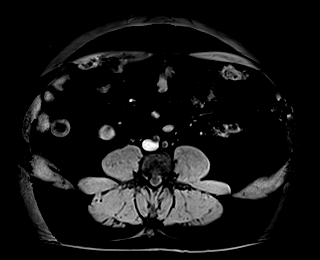
[im 40/80]
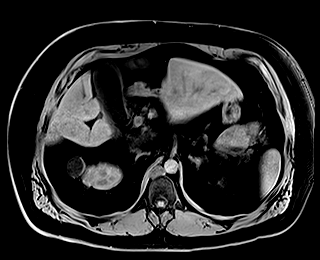
[im 80/80]
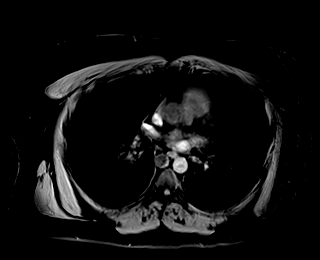

[Series 15: T1 dynamic post-contrast · axial · 3.5mm · 1.41mm/px · z∈[-145,+132]mm · 3 of 80 slices shown (1 of 9)]
[im 1/80]
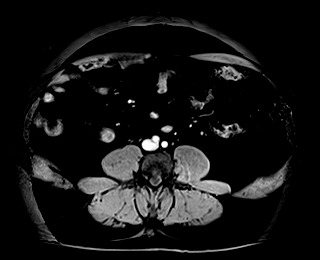
[im 40/80]
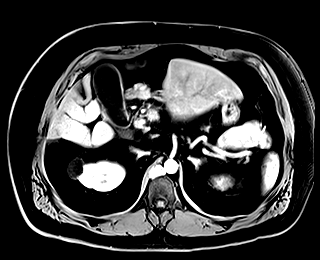
[im 80/80]
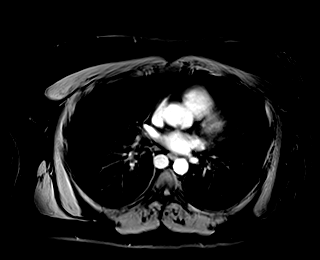

[Series 16: T1 dynamic post-contrast · axial · 3.5mm · 1.41mm/px · z∈[-145,+132]mm · 3 of 80 slices shown (2 of 9)]
[im 1/80]
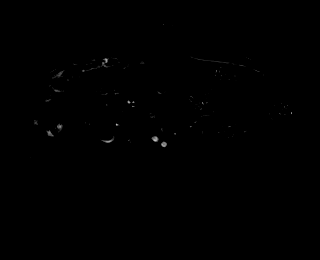
[im 40/80]
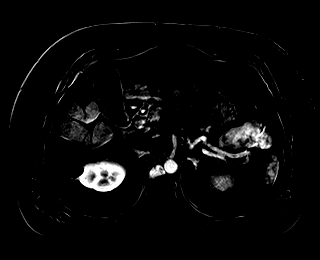
[im 80/80]
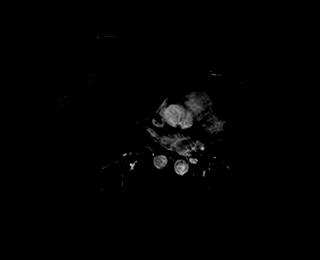

[Series 17: T1 dynamic post-contrast · axial · 3.5mm · 1.41mm/px · z∈[-145,+132]mm · 3 of 80 slices shown (3 of 9)]
[im 1/80]
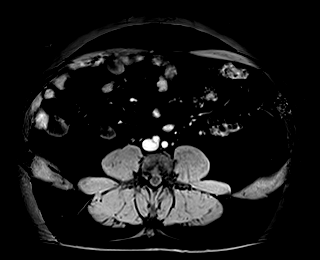
[im 40/80]
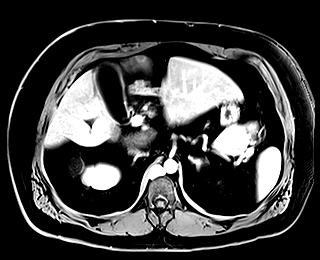
[im 80/80]
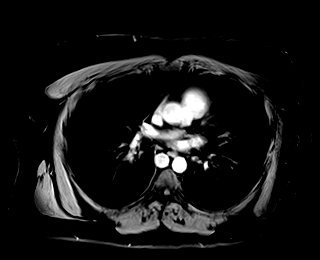

[Series 18: T1 dynamic post-contrast · axial · 3.5mm · 1.41mm/px · z∈[-145,+132]mm · 3 of 80 slices shown (4 of 9)]
[im 1/80]
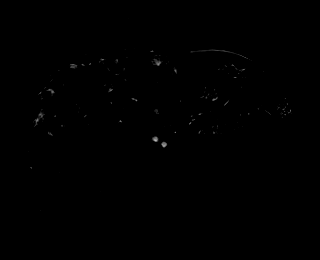
[im 40/80]
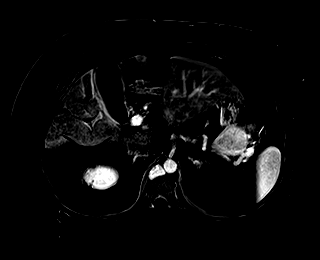
[im 80/80]
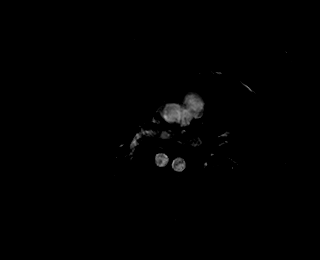

[Series 19: T1 dynamic post-contrast · axial · 3.5mm · 1.41mm/px · z∈[-145,+132]mm · 3 of 80 slices shown (5 of 9)]
[im 1/80]
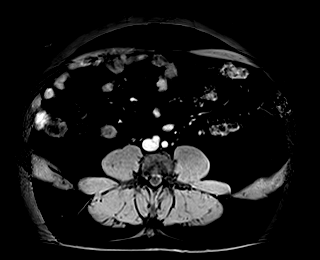
[im 40/80]
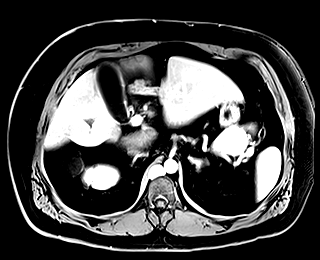
[im 80/80]
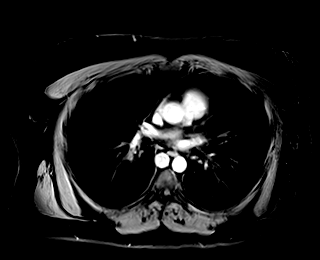

[Series 20: T1 dynamic post-contrast · axial · 3.5mm · 1.41mm/px · z∈[-145,+132]mm · 3 of 80 slices shown (6 of 9)]
[im 1/80]
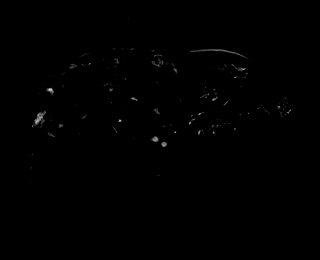
[im 40/80]
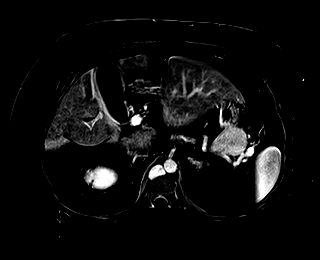
[im 80/80]
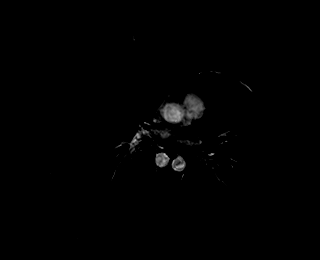

[Series 21: T1 dynamic post-contrast · coronal · 3.0mm · 1.41mm/px · 3 of 72 slices shown (7 of 9)]
[im 1/72]
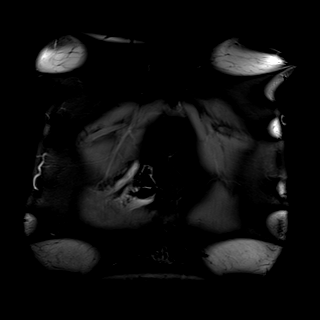
[im 36/72]
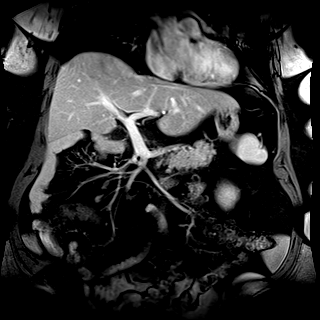
[im 72/72]
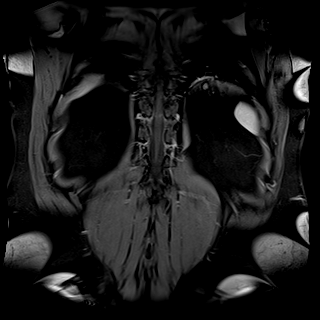

[Series 22: T1 dynamic post-contrast · axial · 3.5mm · 1.41mm/px · z∈[-145,+132]mm · 3 of 80 slices shown (8 of 9)]
[im 1/80]
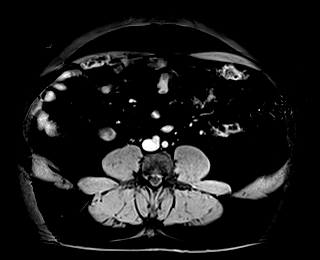
[im 40/80]
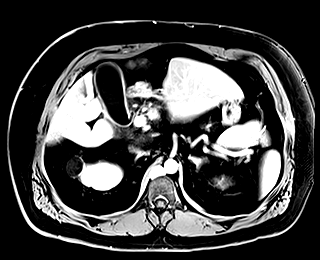
[im 80/80]
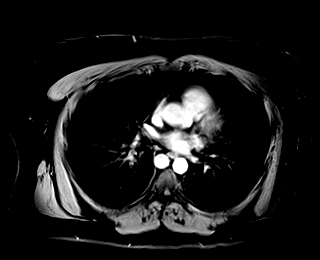

[Series 23: T1 dynamic post-contrast · axial · 3.5mm · 1.41mm/px · z∈[-145,+132]mm · 3 of 80 slices shown (9 of 9)]
[im 1/80]
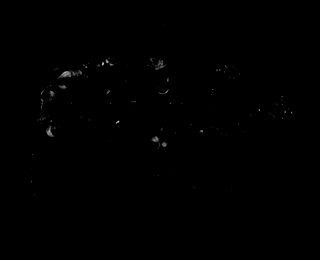
[im 40/80]
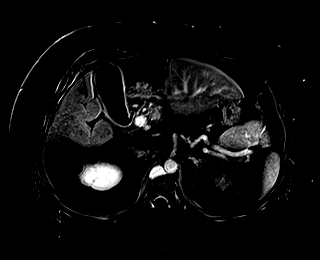
[im 80/80]
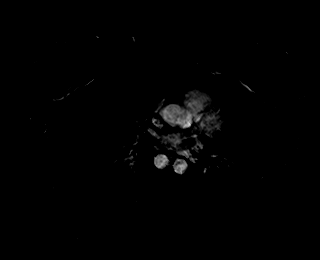

[45 of 48 positions shown; findings below may reference images not displayed]

FINDINGS: Lower chest: No acute abnormality.

Hepatobiliary: Diffuse hepatic steatosis. No suspicious hepatic
lesion.

Tiny cholelithiasis without evidence of acute cholecystitis. No
biliary ductal dilation or choledocholithiasis.

Pancreas: Inflammation about the pancreatic centered along the
pancreaticoduodenal groove without nonenhancing portions of
pancreatic parenchyma, pancreatic ductal dilation or walled off
fluid collection.

Spleen: No splenomegaly or focal splenic lesion. Left upper quadrant
splenules.

Adrenals/Urinary Tract: Bilateral adrenal glands appear normal. No
hydronephrosis. Fluid intensity benign right renal cysts require no
imaging follow-up. No suspicious renal mass.

Stomach/Bowel: Inflammation along the proximal duodenum is favored
reactive. Extensive colonic diverticulosis without findings of acute
diverticulitis.

Vascular/Lymphatic: No pathologically enlarged lymph nodes
identified. No abdominal aortic aneurysm demonstrated.

Other:  No significant abdominal free fluid.

Musculoskeletal: No suspicious bone lesions identified.
IMPRESSION: 1. Acute interstitial pancreatitis with reactive duodenitis. No
walled off fluid collection or pancreatic ductal dilation.
2. Cholelithiasis without findings of acute cholecystitis. No
choledocholithiasis or biliary ductal dilation.

## 2021-12-25 MED ORDER — PANTOPRAZOLE SODIUM 40 MG IV SOLR
40.0000 mg | Freq: Every day | INTRAVENOUS | Status: DC
  Administered 2021-12-25 – 2021-12-29 (×4): 40 mg via INTRAVENOUS
  Filled 2021-12-25 (×5): qty 10

## 2021-12-25 MED ORDER — HYDROMORPHONE HCL 1 MG/ML IJ SOLN
INTRAMUSCULAR | Status: AC
  Administered 2021-12-25: 1 mg via INTRAVENOUS
  Filled 2021-12-25: qty 1

## 2021-12-25 MED ORDER — ONDANSETRON HCL 4 MG/2ML IJ SOLN
4.0000 mg | Freq: Four times a day (QID) | INTRAMUSCULAR | Status: DC | PRN
  Administered 2021-12-25 – 2021-12-27 (×4): 4 mg via INTRAVENOUS
  Filled 2021-12-25 (×6): qty 2

## 2021-12-25 MED ORDER — GADOBUTROL 1 MMOL/ML IV SOLN
10.0000 mL | Freq: Once | INTRAVENOUS | Status: AC | PRN
  Administered 2021-12-25: 10 mL via INTRAVENOUS

## 2021-12-25 MED ORDER — SODIUM CHLORIDE 0.9 % IV SOLN: INTRAVENOUS | Status: DC

## 2021-12-25 MED ORDER — HYDROMORPHONE HCL 1 MG/ML IJ SOLN
1.0000 mg | INTRAMUSCULAR | Status: DC | PRN
  Administered 2021-12-26 – 2021-12-27 (×6): 1 mg via INTRAVENOUS
  Filled 2021-12-25 (×6): qty 1

## 2021-12-25 MED ORDER — CHLORHEXIDINE GLUCONATE CLOTH 2 % EX PADS
6.0000 | MEDICATED_PAD | Freq: Once | CUTANEOUS | Status: AC
  Administered 2021-12-26: 6 via TOPICAL

## 2021-12-25 MED ORDER — CHLORHEXIDINE GLUCONATE CLOTH 2 % EX PADS
6.0000 | MEDICATED_PAD | Freq: Once | CUTANEOUS | Status: AC
  Administered 2021-12-25: 6 via TOPICAL

## 2021-12-25 MED ORDER — SODIUM CHLORIDE 0.9 % IV SOLN
2.0000 g | INTRAVENOUS | Status: AC
  Administered 2021-12-26: 2 g via INTRAVENOUS
  Filled 2021-12-25: qty 2

## 2021-12-25 MED ORDER — HEPARIN SODIUM (PORCINE) 5000 UNIT/ML IJ SOLN
5000.0000 [IU] | Freq: Three times a day (TID) | INTRAMUSCULAR | Status: DC
  Administered 2021-12-27 – 2021-12-28 (×4): 5000 [IU] via SUBCUTANEOUS
  Filled 2021-12-25 (×8): qty 1

## 2021-12-25 NOTE — Assessment & Plan Note (Addendum)
-  Patient with congenital intestinal malrotation; with component of umbilical hernia which appears to be currently incarcerated. -Umbilical hernia has been repaired. -Status post surgical intervention with removal of appendix and cecum; postoperative care per general surgery service.

## 2021-12-25 NOTE — ED Provider Notes (Signed)
[facility] EMERGENCY DEPARTMENT Provider Note   CSN: [id number] Arrival date & time: 12/25/21  0155     History  Chief Complaint  Patient presents with   Chest Pain    Brandon Dominguez is a 36 y.o. male.  Patient presents to the emergency department for evaluation of abdominal and chest pain.  Symptoms waxing and waning over the last 4 days.  Patient reports that he does have worsening of pain when he eats.  When he presented tonight it was central chest radiating into the back.  He was in the waiting room for some time and when he made it to the exam room, pain had moved to the right upper quadrant.       Home Medications Prior to Admission medications   Medication Sig Start Date End Date Taking? Authorizing Provider  CONCERTA 27 MG CR tablet Take 1 tablet (27 mg total) by mouth every morning. Take only on workdays. 08/03/18   Dettinger, Elige Radon, MD  CONCERTA 27 MG CR tablet Take 1 tablet (27 mg total) by mouth every morning. Fill 30 days after prescription date 08/03/18   Dettinger, Elige Radon, MD  methylphenidate (CONCERTA) 27 MG PO CR tablet Take 1 tablet (27 mg total) by mouth every morning. Fill 60 days after prescription date. 07/11/18   Dettinger, Elige Radon, MD  pantoprazole (PROTONIX) 40 MG tablet Take 1 tablet (40 mg total) by mouth daily. Take one tablet in the morning 30 minutes before breakfast. 05/03/17   Ofilia Neas, PA-C      Allergies    Patient has no known allergies.    Review of Systems   Review of Systems  Physical Exam Updated Vital Signs BP (!) 148/94 (BP Location: Left Arm)   Pulse 84   Temp 98.4 F (36.9 C) (Oral)   Resp 20   Ht 5\' 11"  (1.803 m)   Wt 122.5 kg   SpO2 98%   BMI 37.66 kg/m  Physical Exam Vitals and nursing note reviewed.  Constitutional:      General: He is not in acute distress.    Appearance: He is well-developed.  HENT:     Head: Normocephalic and atraumatic.     Mouth/Throat:     Mouth: Mucous membranes are moist.   Eyes:     General: Vision grossly intact. Gaze aligned appropriately.     Extraocular Movements: Extraocular movements intact.     Conjunctiva/sclera: Conjunctivae normal.  Cardiovascular:     Rate and Rhythm: Normal rate and regular rhythm.     Pulses: Normal pulses.     Heart sounds: Normal heart sounds, S1 normal and S2 normal. No murmur heard.    No friction rub. No gallop.  Pulmonary:     Effort: Pulmonary effort is normal. No respiratory distress.     Breath sounds: Normal breath sounds.  Abdominal:     Palpations: Abdomen is soft.     Tenderness: There is abdominal tenderness in the right upper quadrant and epigastric area. There is no guarding or rebound.     Hernia: No hernia is present.  Musculoskeletal:        General: No swelling.     Cervical back: Full passive range of motion without pain, normal range of motion and neck supple. No pain with movement, spinous process tenderness or muscular tenderness. Normal range of motion.     Right lower leg: No edema.     Left lower leg: No edema.  Skin:  General: Skin is warm and dry.     Capillary Refill: Capillary refill takes less than 2 seconds.     Findings: No ecchymosis, erythema, lesion or wound.  Neurological:     Mental Status: He is alert and oriented to person, place, and time.     GCS: GCS eye subscore is 4. GCS verbal subscore is 5. GCS motor subscore is 6.     Cranial Nerves: Cranial nerves 2-12 are intact.     Sensory: Sensation is intact.     Motor: Motor function is intact. No weakness or abnormal muscle tone.     Coordination: Coordination is intact.  Psychiatric:        Mood and Affect: Mood normal.        Speech: Speech normal.        Behavior: Behavior normal.     ED Results / Procedures / Treatments   Labs (all labs ordered are listed, but only abnormal results are displayed) Labs Reviewed  BASIC METABOLIC PANEL - Abnormal; Notable for the following components:      Result Value   Glucose, Bld  226 (*)    All other components within normal limits  CBC - Abnormal; Notable for the following components:   WBC 12.8 (*)    All other components within normal limits  TROPONIN I (HIGH SENSITIVITY)    EKG None  Radiology DG Chest 2 View  Result Date: 12/25/2021 CLINICAL DATA:  Chest pain. EXAM: CHEST - 2 VIEW COMPARISON:  None Available. FINDINGS: Shallow inspiration minimal left lung base atelectasis. No focal consolidation, pleural effusion, or pneumothorax. The cardiac silhouette is within normal limits. No acute osseous pathology. IMPRESSION: No active cardiopulmonary disease. Electronically Signed   By: Elgie Collard M.D.   On: 12/25/2021 03:14    Procedures Procedures    Medications Ordered in ED Medications - No data to display  ED Course/ Medical Decision Making/ A&P                           Medical Decision Making Amount and/or Complexity of Data Reviewed Labs: ordered. Radiology: ordered.   Patient with chest pain, back pain, right upper quadrant pain.  Patient indicates that pain worsens with eating.  He does have cardiac risk factors.  We will rule out acute coronary syndrome.  EKG without obvious ischemia, no ST elevations.  First troponin negative.  Will obtain second troponin.  Additionally, patient having fairly significant right upper quadrant pain and tenderness currently, will need to rule out gallbladder disease.  No lower abdominal tenderness, negative tenderness in the right lower quadrant, left lower quadrant.  Add on hepatic panel, lipase and right upper quadrant ultrasound.  Will sign out to oncoming ER physician to follow-up on results.        Final Clinical Impression(s) / ED Diagnoses Final diagnoses:  None    Rx / DC Orders ED Discharge Orders     None         Alvan Culpepper, Canary Brim, MD 12/25/21 [phone]

## 2021-12-25 NOTE — ED Triage Notes (Signed)
Pt arrives POV c/o gas pains for the past 4 days that gets worse after eating. States the pains are taking his breath now.

## 2021-12-25 NOTE — Assessment & Plan Note (Addendum)
Continue PPI ?

## 2021-12-25 NOTE — Assessment & Plan Note (Addendum)
-   A1c 7.3 -Patient expressed instructed to take metformin in the past; he has been not taking medications and just following diet control and lifestyle changes. -Continue sliding scale insulin while inpatient -Will recommend the use of metformin (starting at 500 mg twice a day using extended release form) along with continued lifestyle changes and modified carbohydrate diet at discharge.

## 2021-12-25 NOTE — Assessment & Plan Note (Addendum)
-  Continue holding Concerta while inpatient. -This medication has also been noted to cause transaminitis. -As long as LFTs levels continue to improve/resolved this medication can be resumed at discharge.

## 2021-12-25 NOTE — Assessment & Plan Note (Signed)
-   Low calorie diet, portion control and increase physical activity discussed with patient. -Body mass index is 37.66 kg/m.

## 2021-12-25 NOTE — ED Triage Notes (Signed)
Pt arrives c/o gas pains for the past 4 days and the pains get worse after he eats. States the pains are taking his breath now.

## 2021-12-25 NOTE — Progress Notes (Signed)
Patient refused heparin shot, stated he wanted to wait til after surgery to start taking it.Dr. Carren Rang notified.

## 2021-12-25 NOTE — Assessment & Plan Note (Addendum)
-   Without significant history of alcohol abuse or significant derangement appreciated on lipid panel. -MRCP demonstrating biliary pancreatitis origin. -No obstructing stone appreciated. -Patient taken by surgery service to the OR for cholecystectomy, Ladds procedure and umbilical hernia repair. -Lipase and LFTs significantly improved. -Continue supportive care, as needed analgesics and slowly advance diet as per recommendations by general surgery. -Continue postoperative care. -No nausea or vomiting.

## 2021-12-25 NOTE — ED Notes (Signed)
Patient states he no longer chest pain and states the pain has moved to right lower abdominal.

## 2021-12-25 NOTE — Consult Note (Signed)
Gastroenterology Consult   Referring Provider: No ref. provider found Primary Care Physician:  Pcp, No Primary Gastroenterologist:  Dr.John Marina Goodell [facility])  Patient ID: Brandon Dominguez; [id number]; 1986/02/17   Admit date: 12/25/2021  LOS: 0 days   Date of Consultation: 12/25/2021  Reason for Consultation:  transaminitis and mild CBD dilation    History of Present Illness   Brandon Dominguez is a 36 y.o. 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  Patient states he has had milder symptoms in the past that usually resolved in 1-2 days with gas-x but this time symptoms lasted for 3 days and associated with vomiting. Gas-x seemed to help some but due to persistent symptoms he came to ed for evaluation. At baseline he had alternating constipation/diarrhea, states due to his pan colonic diverticulosis. Saw GI, Dr.Perry, remotely. Patient reports he has been treated with antibiotics for diverticulitis about 5-6 times in the past. Has had CT imaging 1-2 times. He also notes symptomatic ventral hernia and had seen surgery for repair but then lost his insurance. Denies heartburn, melena, brbpr. Denies FH liver disease, h/o illicit drug use, herbal medication use, frequent etoh use. Drinks etoh about once every few months, single drink usually. Denies daily medication use, Concerta on his list and he does take at time but has not been on lately. Occasional ibuprofen use.   Patient reports recent illness couple of weeks ago. Thought he had strep throat. His sister and girlfriend also sick and both were treated for "strep" but neither were tested. All tested negative for COVID. He denies rash or fever.   Initial labs with glucose of 226, creatinine 0.87, white blood cell count 12,800, hemoglobin 15.7, platelets 286,000, troponin 4-->3, total  bilirubin 4.6, direct bilirubin 2.9, alkaline phosphatase 196, AST 448, ALT 979.  LFTs normal in 2018.  Lipase 994, triglycerides 164.  Chest x-ray unremarkable.  Right upper quadrant ultrasound with no gallstones, CBD 7 mm, normal-appearing gallbladder.  Diffuse hepatic steatosis with focal fatty sparing adjacent to the gallbladder.  Portal vein patent.  Dr. Gwenlyn Perking contacted me earlier today regarding patient's LFTs and suspected biliary pancreatitis.  Advised MRCP to evaluate for possible biliary obstruction as well as check acute hepatitis panel given the degree of elevation of his transaminases.  No prior colonoscopy.   Prior to Admission medications   Medication Sig Start Date End Date Taking? Authorizing Provider  CONCERTA 27 MG CR tablet Take 1 tablet (27 mg total) by mouth every morning. Take only on workdays. Patient not taking: Reported on 12/25/2021 08/03/18   Dettinger, Elige Radon, MD   Patient is not taking at this time                         Current Facility-Administered Medications  Medication Dose Route Frequency Provider Last Rate Last Admin   0.9 %  sodium chloride infusion   Intravenous Continuous Vassie Loll, MD 125 mL/hr at 12/25/21 0913 New Bag at 12/25/21 0913   heparin injection 5,000 Units  5,000 Units Subcutaneous Q8H Vassie Loll, MD       HYDROmorphone (DILAUDID) injection 1 mg  1 mg Intravenous Q3H PRN Vassie Loll, MD   1 mg at 12/25/21 0916   ondansetron (ZOFRAN) injection 4 mg  4 mg Intravenous Q6H PRN Vassie Loll, MD   4 mg at 12/25/21 [phone]  pantoprazole (PROTONIX) injection 40 mg  40 mg Intravenous Daily Vassie Loll, MD   40 mg at 12/25/21 5726    Allergies as of 12/25/2021   (No Known Allergies)    Past Medical History:  Diagnosis Date   Allergy    Asthma    Diabetes mellitus without complication (HCC)    Diverticulitis    Diverticulosis    GERD (gastroesophageal reflux disease) 12/25/2021   Hepatic steatosis    Malrotation of  intestine    Pancreatitis     Past Surgical History:  Procedure Laterality Date   cranial surgery     eye socket fracture   EYE SURGERY     Put plate in    Family History  Problem Relation Age of Onset   Diverticulitis Mother    Colon polyps Mother    Diabetes Paternal Grandmother    Diabetes Paternal Grandfather    Heart disease Paternal Grandfather    Heart disease Paternal Uncle    Heart disease Paternal Aunt    Colon cancer Neg Hx    Kidney disease Neg Hx    Liver disease Neg Hx     Social History   Socioeconomic History   Marital status: Single    Spouse name: Not on file   Number of children: Not on file   Years of education: Not on file   Highest education level: Not on file  Occupational History   Not on file  Tobacco Use   Smoking status: Never   Smokeless tobacco: Never  Vaping Use   Vaping Use: Never used  Substance and Sexual Activity   Alcohol use: Yes    Comment: occassional   Drug use: No   Sexual activity: Not on file  Other Topics Concern   Not on file  Social History Narrative   Not on file   Social Determinants of Health   Financial Resource Strain: Not on file  Food Insecurity: Not on file  Transportation Needs: Not on file  Physical Activity: Not on file  Stress: Not on file  Social Connections: Not on file  Intimate Partner Violence: Not on file     Review of System:   General: Negative for anorexia, weight loss, fever, chills, fatigue, weakness. Eyes: Negative for vision changes.  ENT: Negative for hoarseness, difficulty swallowing , nasal congestion. CV: Negative for chest pain, angina, palpitations, dyspnea on exertion, peripheral edema. See hpi Respiratory: Negative for dyspnea at rest, dyspnea on exertion, cough, sputum, wheezing.  GI: See history of present illness. GU:  Negative for dysuria, hematuria, urinary incontinence, urinary frequency, nocturnal urination.  MS: Negative for joint pain, low back pain.  Derm:  Negative for rash or itching.  Neuro: Negative for weakness, abnormal sensation, seizure, frequent headaches, memory loss, confusion.  Psych: Negative for anxiety, depression, suicidal ideation, hallucinations.  Endo: Negative for unusual weight change.  Heme: Negative for bruising or bleeding. Allergy: Negative for rash or hives.      Physical Examination:   Vital signs in last 24 hours: Temp:  [98.2 F (36.8 C)-98.4 F (36.9 C)] 98.2 F (36.8 C) (06/08 1151) Pulse Rate:  [59-86] 81 (06/08 1151) Resp:  [16-20] 17 (06/08 1151) BP: (112-148)/(54-94) 142/94 (06/08 1151) SpO2:  [90 %-98 %] 98 % (06/08 1151) Weight:  [122.5 kg] 122.5 kg (06/08 0237)    General: Well-nourished, well-developed in no acute distress.  Head: Normocephalic, atraumatic.   Eyes: Conjunctiva pink, no icterus. Mouth: Oropharyngeal mucosa moist and pink , no lesions  erythema or exudate. Neck: Supple without thyromegaly, masses, or lymphadenopathy.  Lungs: Clear to auscultation bilaterally.  Heart: Regular rate and rhythm, no murmurs rubs or gallops.  Abdomen: Bowel sounds are normal, nontender, nondistended, no hepatosplenomegaly or masses, no abdominal bruits, no rebound or guarding.  Large hernia at/above umbilicus nontender but unable to reduce Rectal: not performed Extremities: No lower extremity edema, clubbing, deformity.  Neuro: Alert and oriented x 4 , grossly normal neurologically.  Skin: Warm and dry, no rash or jaundice.   Psych: Alert and cooperative, normal mood and affect.        Intake/Output from previous day: No intake/output data recorded. Intake/Output this shift: No intake/output data recorded.  Lab Results:   CBC Recent Labs    12/25/21 0342  WBC 12.8*  HGB 15.7  HCT 46.7  MCV 92.1  PLT 286   BMET Recent Labs    12/25/21 0342  NA 136  K 3.5  CL 101  CO2 23  GLUCOSE 226*  BUN 20  CREATININE 0.87  CALCIUM 9.0   LFT Recent Labs    12/25/21 0634  BILITOT 4.6*   BILIDIR 2.9*  IBILI 1.7*  ALKPHOS 196*  AST 448*  ALT 979*  PROT 7.6  ALBUMIN 4.0    Lipase Recent Labs    12/25/21 0634  LIPASE 994*    PT/INR No results for input(s): "LABPROT", "INR" in the last 72 hours.   Hepatitis Panel No results for input(s): "HEPBSAG", "HCVAB", "HEPAIGM", "HEPBIGM" in the last 72 hours.     Imaging Studies:   MR ABDOMEN MRCP W WO CONTAST  Result Date: 12/25/2021 CLINICAL DATA:  Pancreatitis, acute, severe right upper quadrant abdominal pain. EXAM: MRI ABDOMEN WITHOUT AND WITH CONTRAST (INCLUDING MRCP) TECHNIQUE: Multiplanar multisequence MR imaging of the abdomen was performed both before and after the administration of intravenous contrast. Heavily T2-weighted images of the biliary and pancreatic ducts were obtained, and three-dimensional MRCP images were rendered by post processing. CONTRAST:  10mL GADAVIST GADOBUTROL 1 MMOL/ML IV SOLN COMPARISON:  Ultrasound December 25, 2021 and CT January 01, 2014 FINDINGS: Lower chest: No acute abnormality. Hepatobiliary: Diffuse hepatic steatosis. No suspicious hepatic lesion. Tiny cholelithiasis without evidence of acute cholecystitis. No biliary ductal dilation or choledocholithiasis. Pancreas: Inflammation about the pancreatic centered along the pancreaticoduodenal groove without nonenhancing portions of pancreatic parenchyma, pancreatic ductal dilation or walled off fluid collection. Spleen: No splenomegaly or focal splenic lesion. Left upper quadrant splenules. Adrenals/Urinary Tract: Bilateral adrenal glands appear normal. No hydronephrosis. Fluid intensity benign right renal cysts require no imaging follow-up. No suspicious renal mass. Stomach/Bowel: Inflammation along the proximal duodenum is favored reactive. Extensive colonic diverticulosis without findings of acute diverticulitis. Vascular/Lymphatic: No pathologically enlarged lymph nodes identified. No abdominal aortic aneurysm demonstrated. Other:  No significant  abdominal free fluid. Musculoskeletal: No suspicious bone lesions identified. IMPRESSION: 1. Acute interstitial pancreatitis with reactive duodenitis. No walled off fluid collection or pancreatic ductal dilation. 2. Cholelithiasis without findings of acute cholecystitis. No choledocholithiasis or biliary ductal dilation. Electronically Signed   By: Maudry Mayhew M.D.   On: 12/25/2021 11:19   MR 3D Recon At Scanner  Result Date: 12/25/2021 CLINICAL DATA:  Pancreatitis, acute, severe right upper quadrant abdominal pain. EXAM: MRI ABDOMEN WITHOUT AND WITH CONTRAST (INCLUDING MRCP) TECHNIQUE: Multiplanar multisequence MR imaging of the abdomen was performed both before and after the administration of intravenous contrast. Heavily T2-weighted images of the biliary and pancreatic ducts were obtained, and three-dimensional MRCP images were rendered by post processing.  CONTRAST:  10mL GADAVIST GADOBUTROL 1 MMOL/ML IV SOLN COMPARISON:  Ultrasound December 25, 2021 and CT January 01, 2014 FINDINGS: Lower chest: No acute abnormality. Hepatobiliary: Diffuse hepatic steatosis. No suspicious hepatic lesion. Tiny cholelithiasis without evidence of acute cholecystitis. No biliary ductal dilation or choledocholithiasis. Pancreas: Inflammation about the pancreatic centered along the pancreaticoduodenal groove without nonenhancing portions of pancreatic parenchyma, pancreatic ductal dilation or walled off fluid collection. Spleen: No splenomegaly or focal splenic lesion. Left upper quadrant splenules. Adrenals/Urinary Tract: Bilateral adrenal glands appear normal. No hydronephrosis. Fluid intensity benign right renal cysts require no imaging follow-up. No suspicious renal mass. Stomach/Bowel: Inflammation along the proximal duodenum is favored reactive. Extensive colonic diverticulosis without findings of acute diverticulitis. Vascular/Lymphatic: No pathologically enlarged lymph nodes identified. No abdominal aortic aneurysm demonstrated.  Other:  No significant abdominal free fluid. Musculoskeletal: No suspicious bone lesions identified. IMPRESSION: 1. Acute interstitial pancreatitis with reactive duodenitis. No walled off fluid collection or pancreatic ductal dilation. 2. Cholelithiasis without findings of acute cholecystitis. No choledocholithiasis or biliary ductal dilation. Electronically Signed   By: Maudry MayhewJeffrey  Waltz M.D.   On: 12/25/2021 11:19   US ABDOMEN LIMITED RUQ (LIVER/GB)  Result Date: 12/25/2021 CLINICAL DATA:  Postprandial upper abdominal pain for 4 days EXAM: ULTRASOUND ABDOMEN LIMITED RIGHT UPPER QUADRANT COMPARISON:  08/03/2013 CT abdomen/pelvis FINDINGS: Gallbladder: No gallstones or wall thickening visualized. No sonographic Murphy sign noted by sonographer. Common bile duct: Diameter: 7 mm Liver: Diffusely echogenic liver parenchyma with posterior acoustic attenuation, compatible with diffuse hepatic steatosis. No definite liver surface irregularity. Crescentic subcapsular hypoechoic liver focus adjacent to the gallbladder is compatible with focal fatty sparing. No liver masses, noting decreased sensitivity in the setting of an echogenic liver. Portal vein is patent on color Doppler imaging with normal direction of blood flow towards the liver. Other: None. IMPRESSION: 1. Mild common bile duct dilation (7 mm diameter). Recommend correlation with serum bilirubin level. MRI abdomen without and with IV contrast with MRCP may be considered as clinically warranted. 2. Normal gallbladder, with no cholelithiasis. 3. Diffuse hepatic steatosis with focal fatty sparing adjacent to the gallbladder. Electronically Signed   By: Delbert PhenixJason A Poff M.D.   On: 12/25/2021 08:10   DG Chest 2 View  Result Date: 12/25/2021 CLINICAL DATA:  Chest pain. EXAM: CHEST - 2 VIEW COMPARISON:  None Available. FINDINGS: Shallow inspiration minimal left lung base atelectasis. No focal consolidation, pleural effusion, or pneumothorax. The cardiac silhouette is  within normal limits. No acute osseous pathology. IMPRESSION: No active cardiopulmonary disease. Electronically Signed   By: Elgie CollardArash  Radparvar M.D.   On: 12/25/2021 03:14  [4 week]  Assessment:   36 year old male with history of diverticulosis/diverticulitis, ventral hernia, hepatic steatosis, malrotation of the intestine, diabetes mellitus (not on medications) who presented to the ER this morning due to chest pain/upper abdominal pain associated with single episode of vomiting over the past 3 days.  GI consulted for abnormal LFTs and concern for possible biliary pancreatitis.  Elevated LFTs/lipase: No recent LFTs to compare.  In 2018 his LFTs were normal.  Has intermittent episodes of pressure in the chest/upper abdomen when she feels is gas related, typically resolves with Gas-X.  This time symptoms persisted for 3 days, associated with episode of vomiting.  On presentation lipase significantly elevated as well as LFTs.  There was concern for possible biliary pancreatitis.  Right upper quadrant ultrasound unremarkable.  MRCP with no evidence of choledocholithiasis.  No biliary dilation on MRI.  It did pick up on tiny cholelithiasis  and acute interstitial pancreatitis.  At this time his abdominal pain has improved.  Patient notes recent illness 2 weeks ago, sore throat, ill contacts.  Not clear etiology but COVID-negative.  Others treated for strep but were not tested.  Likely with biliary pancreatitis,potentially passed small stone, it is not clear if he has a viral component adding to the picture with recent illness and significantly elevated transaminases especially ALT.  Large ventral hernia and history of malrotation of the small bowel: Plans for surgical repair this admission, further imaging via CT planned as well.  Patient reports "diverticulitis flares" off and on over the years, possibly in part related to symptomatic hernia as well.   Plan:   Supportive measures. NPO. Adequate hydration in  light of acute pancreatitis. Agree with further imaging via CT abdomen pelvis to detail ventral hernia. Appreciate surgery input. Follow-up acute hepatitis panel. Trend LFTs.   LOS: 0 days   We would like to thank you for the opportunity to participate in the care of Trestin Fiumara.  Leanna Battles. Dixon Boos [facility] [phone] 6/8/202312:00 PM

## 2021-12-25 NOTE — ED Provider Notes (Signed)
  Physical Exam  BP 137/86   Pulse 86   Temp 98.4 F (36.9 C) (Oral)   Resp 16   Ht 5\' 11"  (1.803 m)   Wt 122.5 kg   SpO2 98%   BMI 37.66 kg/m   Physical Exam  Procedures  Procedures  ED Course / MDM    Medical Decision Making Amount and/or Complexity of Data Reviewed Labs: ordered. Radiology: ordered.   Received patient in signout.  Abdominal pain chest pain.  Has had for a few days.  LFTs are elevated as are lipase.  Ultrasound done and showed potentially elevated common bile duct but no gallstones and gallbladder looked good.  With continued pain and pancreatitis I feel he would benefit from mission the hospital for further work-up.  Potentially need MRCP since ultrasound did not show cause of mild dilatation.  Will discuss with hospitalist.  Does not drink much alcohol and has not had high lipids.       , MD 12/25/21 [phone]

## 2021-12-25 NOTE — H&P (View-Only) (Signed)
[facility] Consult  Reason for Consult: Gallstones pancreatitis Referring Physician: Dr. Madera   Chief Complaint   Chest Pain     HPI: Brandon Dominguez is a 35 y.o. male with complaints of epigastric pain and associated nausea and vomiting.  The pain had been going on for about 4 days and was waxing and waning. He was unable to really eat anything and was having very small Bms. He says anytime he ate he had pain that was central in the epigastric region/ lower chest region and radiated to the back.  He was evaluated in the Ed and noted to have gallstones on US and an elevated lipase. MRCP demonstrated gallstone pancreatitis and no choledocholithiasis.      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 He learned about his malrotation in 2015 during a CT scan for diverticulitis concern.  He also has an umbilical hernia that has grown over the course of the last few years and causes him some discomfort at times.   He says his abdominal pain in the epigastric region is better. He wants to try some clear liquids.   Past Medical History:  Diagnosis Date   Allergy    Asthma    Diabetes mellitus without complication (HCC)    Diverticulitis    Diverticulosis    GERD (gastroesophageal reflux disease) 12/25/2021   Hepatic steatosis    Malrotation of intestine    Pancreatitis     Past Surgical History:  Procedure Laterality Date   cranial surgery     eye socket fracture   EYE SURGERY     Put plate in    Family History  Problem Relation Age of Onset   Diverticulitis Mother    Colon polyps Mother    Diabetes Paternal Grandmother    Diabetes Paternal Grandfather    Heart disease Paternal Grandfather    Heart disease Paternal Uncle    Heart disease Paternal Aunt    Colon cancer Neg Hx    Kidney disease Neg Hx    Liver disease Neg Hx     Social History   Tobacco Use   Smoking status: Never   Smokeless tobacco: Never  Vaping Use    Vaping Use: Never used  Substance Use Topics   Alcohol use: Yes    Comment: occassional   Drug use: No    Medications: I have reviewed the patient's current medications. Prior to Admission:  Medications Prior to Admission  Medication Sig Dispense Refill Last Dose   CONCERTA 27 MG CR tablet Take 1 tablet (27 mg total) by mouth every morning. Take only on workdays. (Patient not taking: Reported on 12/25/2021) 30 tablet 0 Not Taking   CONCERTA 27 MG CR tablet Take 1 tablet (27 mg total) by mouth every morning. Fill 30 days after prescription date (Patient not taking: Reported on 12/25/2021) 30 tablet 0 Not Taking   methylphenidate (CONCERTA) 27 MG PO CR tablet Take 1 tablet (27 mg total) by mouth every morning. Fill 60 days after prescription date. (Patient not taking: Reported on 12/25/2021) 30 tablet 0 Not Taking   pantoprazole (PROTONIX) 40 MG tablet Take 1 tablet (40 mg total) by mouth daily. Take one tablet in the morning 30 minutes before breakfast. (Patient not taking: Reported on 12/25/2021) 30 tablet 0 Not Taking   Scheduled:  Chlorhexidine Gluconate Cloth  6 each Topical Once   And   Chlorhexidine Gluconate Cloth  6 each Topical Once   heparin   injection (subcutaneous)  5,000 Units Subcutaneous Q8H   pantoprazole (PROTONIX) IV  40 mg Intravenous Daily   Continuous:  sodium chloride 175 mL/hr at 12/25/21 1415   [START ON 12/26/2021] cefoTEtan (CEFOTAN) IV     PRN:HYDROmorphone (DILAUDID) injection, ondansetron (ZOFRAN) IV  No Known Allergies   ROS:  A comprehensive review of systems was negative except for: Gastrointestinal: positive for abdominal pain, nausea, and vomiting  Blood pressure (!) 142/94, pulse 81, temperature 98.2 F (36.8 C), temperature source Oral, resp. rate 17, height 5' 11" (1.803 m), weight 122.5 kg, SpO2 98 %. Physical Exam Vitals reviewed.  HENT:     Head: Normocephalic.  Eyes:     Pupils: Pupils are equal, round, and reactive to light.  Cardiovascular:      Rate and Rhythm: Normal rate.  Pulmonary:     Effort: Pulmonary effort is normal.  Abdominal:     Palpations: Abdomen is soft.     Tenderness: There is abdominal tenderness.     Hernia: A hernia is present. Hernia is present in the umbilical area.     Comments: Incarcerated hernia at the umbilicus  Musculoskeletal:        General: Normal range of motion.     Cervical back: Normal range of motion.  Skin:    General: Skin is warm.  Neurological:     General: No focal deficit present.     Mental Status: He is alert.  Psychiatric:        Mood and Affect: Mood normal.     Results: Results for orders placed or performed during the hospital encounter of 12/25/21 (from the past 48 hour(s))  Basic metabolic panel     Status: Abnormal   Collection Time: 12/25/21  3:42 AM  Result Value Ref Range   Sodium 136 135 - 145 mmol/L   Potassium 3.5 3.5 - 5.1 mmol/L   Chloride 101 98 - 111 mmol/L   CO2 23 22 - 32 mmol/L   Glucose, Bld 226 (H) 70 - 99 mg/dL    Comment: Glucose reference range applies only to samples taken after fasting for at least 8 hours.   BUN 20 6 - 20 mg/dL   Creatinine, Ser 0.87 0.61 - 1.24 mg/dL   Calcium 9.0 8.9 - 10.3 mg/dL   GFR, Estimated >60 >60 mL/min    Comment: (NOTE) Calculated using the CKD-EPI Creatinine Equation (2021)    Anion gap 12 5 - 15    Comment: Performed at [facility], [address] [place], [place] [address]  CBC     Status: Abnormal   Collection Time: 12/25/21  3:42 AM  Result Value Ref Range   WBC 12.8 (H) 4.0 - 10.5 K/uL   RBC 5.07 4.22 - 5.81 MIL/uL   Hemoglobin 15.7 13.0 - 17.0 g/dL   HCT 46.7 39.0 - 52.0 %   MCV 92.1 80.0 - 100.0 fL   MCH 31.0 26.0 - 34.0 pg   MCHC 33.6 30.0 - 36.0 g/dL   RDW 13.4 11.5 - 15.5 %   Platelets 286 150 - 400 K/uL   nRBC 0.0 0.0 - 0.2 %    Comment: Performed at  Hospital, [address] , [place] [address]  Troponin I (High Sensitivity)     Status: None   Collection Time: 12/25/21  3:42  AM  Result Value Ref Range   Troponin I (High Sensitivity) 4 <18 ng/L    Comment: (NOTE) Elevated high sensitivity troponin I (hsTnI) values and   significant  changes across serial measurements may suggest ACS but many other  chronic and acute conditions are known to elevate hsTnI results.  Refer to the "Links" section for chest pain algorithms and additional  guidance. Performed at [facility], [address] [place], [place] [address]   Hemoglobin A1c     Status: Abnormal   Collection Time: 12/25/21  3:42 AM  Result Value Ref Range   Hgb A1c MFr Bld 7.7 (H) 4.8 - 5.6 %    Comment: (NOTE) Pre diabetes:          5.7%-6.4%  Diabetes:              >6.4%  Glycemic control for   <7.0% adults with diabetes    Mean Plasma Glucose 174.29 mg/dL    Comment: Performed at [facility], [address] [place], [place] [address]  HIV Antibody (routine testing w rflx)     Status: None   Collection Time: 12/25/21  3:42 AM  Result Value Ref Range   HIV Screen 4th Generation wRfx Non Reactive Non Reactive    Comment: Performed at [facility], [address] [place], [place] [address]  Hepatic function panel     Status: Abnormal   Collection Time: 12/25/21  6:34 AM  Result Value Ref Range   Total Protein 7.6 6.5 - 8.1 g/dL   Albumin 4.0 3.5 - 5.0 g/dL   AST 448 (H) 15 - 41 U/L   ALT 979 (H) 0 - 44 U/L   Alkaline Phosphatase 196 (H) 38 - 126 U/L   Total Bilirubin 4.6 (H) 0.3 - 1.2 mg/dL   Bilirubin, Direct 2.9 (H) 0.0 - 0.2 mg/dL   Indirect Bilirubin 1.7 (H) 0.3 - 0.9 mg/dL    Comment: Performed at [facility], [address] [place], [place] [address]  Lipase, blood     Status: Abnormal   Collection Time: 12/25/21  6:34 AM  Result Value Ref Range   Lipase 994 (H) 11 - 51 U/L    Comment: RESULTS CONFIRMED BY MANUAL DILUTION Performed at [facility], [address] [place], [place] [address]   Lipid panel     Status: Abnormal   Collection Time: 12/25/21  6:34 AM   Result Value Ref Range   Cholesterol 200 0 - 200 mg/dL   Triglycerides 164 (H) <150 mg/dL   HDL 63 >40 mg/dL   Total CHOL/HDL Ratio 3.2 RATIO   VLDL 33 0 - 40 mg/dL   LDL Cholesterol 104 (H) 0 - 99 mg/dL    Comment:        Total Cholesterol/HDL:CHD Risk Coronary Heart Disease Risk Table                     Men   Women  1/2 Average Risk   3.4   3.3  Average Risk       5.0   4.4  2 X Average Risk   9.6   7.1  3 X Average Risk  23.4   11.0        Use the calculated Patient Ratio above and the CHD Risk Table to determine the patient's CHD Risk.        ATP III CLASSIFICATION (LDL):  <100     mg/dL   Optimal  100-129  mg/dL   Near or Above                    Optimal    130-159  mg/dL   Borderline  160-189  mg/dL   High  >190     mg/dL   Very High Performed at [facility], [address] [place], [place] [address]    Personally reviewed MRCP, US and ordered CT a/p and have reviewed-Gallstone pancreatitis, large umbilical hernia with fat, 2.5cm defect, omentum in the hernia, malrotation with small bowel on the left and colon on the right  CT ABDOMEN PELVIS WO CONTRAST  Result Date: 12/25/2021 CLINICAL DATA:  Right lower quadrant pain x3 days, umbilical hernia EXAM: CT ABDOMEN AND PELVIS WITHOUT CONTRAST TECHNIQUE: Multidetector CT imaging of the abdomen and pelvis was performed following the standard protocol without IV contrast. RADIATION DOSE REDUCTION: This exam was performed according to the departmental dose-optimization program which includes automated exposure control, adjustment of the mA and/or kV according to patient size and/or use of iterative reconstruction technique. COMPARISON:  08/03/2013 FINDINGS: Lower chest: Small linear densities seen in the lower lung fields suggesting scarring or minimal subsegmental atelectasis. Hepatobiliary: No focal abnormality is seen in the liver. There is no dilation of bile ducts. There is mild stranding in the fat planes adjacent to  gallbladder. Gallbladder is not distended. Pancreas: There is mild stranding in the peripancreatic fat. There are no loculated fluid collections in or around the pancreas. There is no dilation of pancreatic duct. Spleen: Unremarkable. Adrenals/Urinary Tract: Adrenals are unremarkable. There is no hydronephrosis. There is subtle increase in density in the some of the minor calices. Significance of this finding is not clear. This may be related to patient's hydration status or suggest presence of tiny renal stones. No discrete measurable calculi are noted. There are few smooth marginated low-density lesions in the kidneys largest in the upper pole of right kidney measuring 3.1 cm. Findings suggest possible renal cysts. Some of the smaller lesions could not be optimally characterized. Stomach/Bowel: Stomach is not distended. Small bowel loops are not dilated. Most of the small bowel loops are noted in the right side of the abdomen with most of the colon lying in the left side of the abdomen suggesting congenital malrotation. Appendix is not dilated. Numerous diverticula are seen in the colon without signs of focal acute diverticulitis. 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 Reproductive: There are few coarse calcifications in the prostate. Other: There is no ascites or pneumoperitoneum. There is significant interval increase in size of umbilical hernia. Umbilical hernial sac measures proximally 13.5 x 11.1 x 8.7 cm. There is fat within the large umbilical hernia. No bowel loops are noted. There is minimal stranding in the fat planes within the hernia without any loculated fluid collections. Musculoskeletal: No acute findings are seen. IMPRESSION: There is stranding in the fat planes adjacent to pancreas and gallbladder. Findings suggest possible mild pancreatitis and possibly cholecystitis. Please correlate with clinical symptoms and laboratory findings and consider follow-up sonogram.  There is no dilation of bile ducts. There is large umbilical hernia measuring 13.5 cm in maximum diameter containing fat. There is minimal stranding in the fat planes in the hernial sac which may suggest mild acute or chronic inflammation. There is no loculated fluid collection within the umbilical hernia. There are no bowel loops within the hernia. Diverticulosis of colon without signs of focal diverticulitis. Appendix is not dilated. Other findings as described in the body of the report. Electronically Signed   By: Palani  Rathinasamy M.D.   On: 12/25/2021 14:44   MR ABDOMEN MRCP W WO CONTAST  Result Date:   12/25/2021 CLINICAL DATA:  Pancreatitis, acute, severe right upper quadrant abdominal pain. EXAM: MRI ABDOMEN WITHOUT AND WITH CONTRAST (INCLUDING MRCP) TECHNIQUE: Multiplanar multisequence MR imaging of the abdomen was performed both before and after the administration of intravenous contrast. Heavily T2-weighted images of the biliary and pancreatic ducts were obtained, and three-dimensional MRCP images were rendered by post processing. CONTRAST:  10mL GADAVIST GADOBUTROL 1 MMOL/ML IV SOLN COMPARISON:  Ultrasound December 25, 2021 and CT January 01, 2014 FINDINGS: Lower chest: No acute abnormality. Hepatobiliary: Diffuse hepatic steatosis. No suspicious hepatic lesion. Tiny cholelithiasis without evidence of acute cholecystitis. No biliary ductal dilation or choledocholithiasis. Pancreas: Inflammation about the pancreatic centered along the pancreaticoduodenal groove without nonenhancing portions of pancreatic parenchyma, pancreatic ductal dilation or walled off fluid collection. Spleen: No splenomegaly or focal splenic lesion. Left upper quadrant splenules. Adrenals/Urinary Tract: Bilateral adrenal glands appear normal. No hydronephrosis. Fluid intensity benign right renal cysts require no imaging follow-up. No suspicious renal mass. Stomach/Bowel: Inflammation along the proximal duodenum is favored reactive.  Extensive colonic diverticulosis without findings of acute diverticulitis. Vascular/Lymphatic: No pathologically enlarged lymph nodes identified. No abdominal aortic aneurysm demonstrated. Other:  No significant abdominal free fluid. Musculoskeletal: No suspicious bone lesions identified. IMPRESSION: 1. Acute interstitial pancreatitis with reactive duodenitis. No walled off fluid collection or pancreatic ductal dilation. 2. Cholelithiasis without findings of acute cholecystitis. No choledocholithiasis or biliary ductal dilation. Electronically Signed   By: Jeffrey  Waltz M.D.   On: 12/25/2021 11:19   MR 3D Recon At Scanner  Result Date: 12/25/2021 CLINICAL DATA:  Pancreatitis, acute, severe right upper quadrant abdominal pain. EXAM: MRI ABDOMEN WITHOUT AND WITH CONTRAST (INCLUDING MRCP) TECHNIQUE: Multiplanar multisequence MR imaging of the abdomen was performed both before and after the administration of intravenous contrast. Heavily T2-weighted images of the biliary and pancreatic ducts were obtained, and three-dimensional MRCP images were rendered by post processing. CONTRAST:  10mL GADAVIST GADOBUTROL 1 MMOL/ML IV SOLN COMPARISON:  Ultrasound December 25, 2021 and CT January 01, 2014 FINDINGS: Lower chest: No acute abnormality. Hepatobiliary: Diffuse hepatic steatosis. No suspicious hepatic lesion. Tiny cholelithiasis without evidence of acute cholecystitis. No biliary ductal dilation or choledocholithiasis. Pancreas: Inflammation about the pancreatic centered along the pancreaticoduodenal groove without nonenhancing portions of pancreatic parenchyma, pancreatic ductal dilation or walled off fluid collection. Spleen: No splenomegaly or focal splenic lesion. Left upper quadrant splenules. Adrenals/Urinary Tract: Bilateral adrenal glands appear normal. No hydronephrosis. Fluid intensity benign right renal cysts require no imaging follow-up. No suspicious renal mass. Stomach/Bowel: Inflammation along the proximal  duodenum is favored reactive. Extensive colonic diverticulosis without findings of acute diverticulitis. Vascular/Lymphatic: No pathologically enlarged lymph nodes identified. No abdominal aortic aneurysm demonstrated. Other:  No significant abdominal free fluid. Musculoskeletal: No suspicious bone lesions identified. IMPRESSION: 1. Acute interstitial pancreatitis with reactive duodenitis. No walled off fluid collection or pancreatic ductal dilation. 2. Cholelithiasis without findings of acute cholecystitis. No choledocholithiasis or biliary ductal dilation. Electronically Signed   By: Jeffrey  Waltz M.D.   On: 12/25/2021 11:19   US ABDOMEN LIMITED RUQ (LIVER/GB)  Result Date: 12/25/2021 CLINICAL DATA:  Postprandial upper abdominal pain for 4 days EXAM: ULTRASOUND ABDOMEN LIMITED RIGHT UPPER QUADRANT COMPARISON:  08/03/2013 CT abdomen/pelvis FINDINGS: Gallbladder: No gallstones or wall thickening visualized. No sonographic Murphy sign noted by sonographer. Common bile duct: Diameter: 7 mm Liver: Diffusely echogenic liver parenchyma with posterior acoustic attenuation, compatible with diffuse hepatic steatosis. No definite liver surface irregularity. Crescentic subcapsular hypoechoic liver focus adjacent to the gallbladder is compatible with focal   fatty sparing. No liver masses, noting decreased sensitivity in the setting of an echogenic liver. Portal vein is patent on color Doppler imaging with normal direction of blood flow towards the liver. Other: None. IMPRESSION: 1. Mild common bile duct dilation (7 mm diameter). Recommend correlation with serum bilirubin level. MRI abdomen without and with IV contrast with MRCP may be considered as clinically warranted. 2. Normal gallbladder, with no cholelithiasis. 3. Diffuse hepatic steatosis with focal fatty sparing adjacent to the gallbladder. Electronically Signed   By: Jason A Poff M.D.   On: 12/25/2021 08:10   DG Chest 2 View  Result Date: 12/25/2021 CLINICAL  DATA:  Chest pain. EXAM: CHEST - 2 VIEW COMPARISON:  None Available. FINDINGS: Shallow inspiration minimal left lung base atelectasis. No focal consolidation, pleural effusion, or pneumothorax. The cardiac silhouette is within normal limits. No acute osseous pathology. IMPRESSION: No active cardiopulmonary disease. Electronically Signed   By: Arash  Radparvar M.D.   On: 12/25/2021 03:14     Assessment & Plan:  Brandon Dominguez is a 35 y.o. male with gallstone pancreatitis, known malrotation, known umbilical hernia with a large amount of omentum, no bowel and a 2.5cm defect.   -PLAN: I counseled the patient about the indication, risks and benefits of laparoscopic cholecystectomy.  He understands there is a very small chance for bleeding, infection, injury to normal structures (including common bile duct), conversion to open surgery, persistent symptoms, evolution of postcholecystectomy diarrhea, need for secondary interventions, anesthesia reaction, cardiopulmonary issues and other risks not specifically detailed here. I described the expected recovery, the plan for follow-up and the restrictions during the recovery phase.  All questions were answered.  Discussed also addressing any ladd's bands if present given the risk of volvulus and obstruction with malrotation. Discussed that we can lyse these if evident. Discussed repair of the umbilical hernia and possible primary repair with permanent suture versus mesh. I would prefer to not do a mesh given the cholecystectomy but if too large we may have to pursue mesh and risk the small chance of infection. Discussed risk of recurrence with hernia repair.   All questions were answered to the satisfaction of the patient and family, called his mother and updated her.  OR tomorrow. NPO midnight Lipase in AM Clear diet now    Brandon Dominguez 12/25/2021, 4:41 PM       

## 2021-12-25 NOTE — Assessment & Plan Note (Addendum)
-  In the setting of biliary pancreatitis and hepatic steatosis -Hepatitis panel negative for acute infection. -MRCP demonstrating fatty liver changes and no other structural abnormalities. -Continue patient follow-up with GI service as an outpatient. -Lifestyle changes, modify carbohydrate and low-fat diet recommended.

## 2021-12-25 NOTE — H&P (Signed)
History and Physical    Patient: Brandon Dominguez ZOX:[id number] DOB: [birth year] DOA: 12/25/2021 DOS: the patient was seen and examined on 12/25/2021 PCP: Pcp, No  Patient coming from: Home  Chief Complaint:  Chief Complaint  Patient presents with   Chest Pain   HPI: Brandon ShookBryan Dominguez is a 36 y.o. male with medical history significant of class II obesity, gastroesophageal flux disease, ADHD, history of diverticulosis, hepatic steatosis and prediabetes; who presented to the hospital secondary to abdominal pain, nausea/vomiting.  Symptoms has been present for the last 4-5 days and worsening; in the last 24 hours had difficulty keeping things down and reports discomfort worsen with oral intake.  He has not noticed any hematemesis, patient's pain mainly located in the epigastric area to mid abdomen, reports to be intermittent, radiates to lower aspect of his chest region and back, 8-9 out of 10 in intensity; relieved in 2 occasions after vomiting and worsening with oral intake.  Patient reports no fever, no sick contacts, no diarrhea, no melena, no hematochezia, no dysuria, no hematuria, no focal weaknesses or any other complaints.  In the ED abdominal ultrasound failed to demonstrate any gallstones but reveal mild dilatation of CBD.  Patient blood work remarkable for significant transaminitis and elevated lipase.  MRCP demonstrated gallstone pancreatitis and no choledocholithiasis.  TRH has been called to place patient in the hospital for further evaluation and management.   Review of Systems: As mentioned in the history of present illness. All other systems reviewed and are negative. Past Medical History:  Diagnosis Date   Allergy    Asthma    Diverticulitis    Diverticulosis    GERD (gastroesophageal reflux disease) 12/25/2021   Hepatic steatosis    Malrotation of intestine    Past Surgical History:  Procedure Laterality Date   cranial surgery     eye socket fracture   EYE SURGERY     Put plate in    Social History:  reports that he has never smoked. He has never used smokeless tobacco. He reports current alcohol use. He reports that he does not use drugs.  No Known Allergies  Family History  Problem Relation Age of Onset   Diverticulitis Mother    Colon polyps Mother    Diabetes Paternal Grandmother    Diabetes Paternal Grandfather    Heart disease Paternal Grandfather    Heart disease Paternal Uncle    Heart disease Paternal Aunt    Colon cancer Neg Hx    Kidney disease Neg Hx    Liver disease Neg Hx     Prior to Admission medications   Medication Sig Start Date End Date Taking? Authorizing Provider  CONCERTA 27 MG CR tablet Take 1 tablet (27 mg total) by mouth every morning. Take only on workdays. Patient not taking: Reported on 12/25/2021 08/03/18   Dettinger, Elige RadonJoshua A, MD  CONCERTA 27 MG CR tablet Take 1 tablet (27 mg total) by mouth every morning. Fill 30 days after prescription date Patient not taking: Reported on 12/25/2021 08/03/18   Dettinger, Elige RadonJoshua A, MD  methylphenidate (CONCERTA) 27 MG PO CR tablet Take 1 tablet (27 mg total) by mouth every morning. Fill 60 days after prescription date. Patient not taking: Reported on 12/25/2021 07/11/18   Dettinger, Elige RadonJoshua A, MD  pantoprazole (PROTONIX) 40 MG tablet Take 1 tablet (40 mg total) by mouth daily. Take one tablet in the morning 30 minutes before breakfast. Patient not taking: Reported on 12/25/2021 05/03/17   Ofilia Neaslark, Michael L, PA-C  Physical Exam: Vitals:   12/25/21 0237 12/25/21 0701 12/25/21 0800  BP: (!) 148/94 137/86 120/76  Pulse: 84 86 (!) 59  Resp: 20 16 16   Temp: 98.4 F (36.9 C)    TempSrc: Oral    SpO2: 98% 98% 97%  Weight: 122.5 kg    Height: 5\' 11"  (1.803 m)     General exam: Alert, awake, oriented x 3; feeling better after receiving antiemetics and analgesics.  No fever.  Hemodynamically stable. Respiratory system: Clear to auscultation. Respiratory effort normal.  No requiring oxygen  supplementation. Cardiovascular system:RRR. No murmurs, rubs, gallops.  No JVD on exam. Gastrointestinal system: Abdomen is nondistended, soft and tender to palpation in midepigastric and right upper quadrant area.  Positive bowel sounds appreciated.  Positive umbilical hernia with concerns for incarceration (not reducible on exam). Central nervous system: Alert and oriented. No focal neurological deficits. Extremities: No cyanosis or clubbing; trace edema appreciated bilaterally. Skin: No rashes, no petechiae. Psychiatry: Judgement and insight appear normal. Mood & affect appropriate.   Data Reviewed: Complete metabolic panel: Sodium 136, potassium 3.5, blood sugar 226, BUN 20, creatinine 0.87; total protein 7.6, AST 448, ALT 979, alkaline phosphatase 196, total bilirubin 4.6 CBC demonstrating WBCs of 12.8, hemoglobin 15.7 platelet count 286 K Negative troponin x2 Lipase 994 Lipid panel with triglycerides of 164, LDL 104, HDL 63 and total cholesterol 200.  Assessment and Plan: * Acute pancreatitis - Without significant history of alcohol abuse or significant derangement appreciated on lipid panel. -Most likely biliary pancreatitis. -MRCP has help making diagnosis and is ruling out choledocholithiasis. -Appreciate assistance and recommendation by GI service; general surgery has been invited to follow patient along with decision of when to proceed with cholecystectomy. -N.p.o., IV fluids, antiemetics and analgesics will be provided for conservative management. -Patient is afebrile with minimal elevation of his WBCs and no signs of pseudocyst or necrotizing changes.  Holding antibiotics. -Continue supportive care and follow clinical response.  Transaminitis - In the setting of biliary pancreatitis and hepatic steatosis -Hepatitis panel has been ordered -MRCP demonstrating fatty liver changes and no other structural abnormalities. -GI service has been consulted and will follow any further  recommendations.  GERD (gastroesophageal reflux disease) - Continue PPI.  Class 2 obesity due to excess calories in adult - Low calorie diet, portion control and increase physical activity discussed with patient. -Body mass index is 37.66 kg/m.   Prediabetes - A1c will be updated -Patient expressed instructed to take metformin in the past; he has been not taking medications and just following diet control and lifestyle changes. -Sliding scale insulin will be provided and CBG will be followed.  Attention deficit disorder (ADD) without hyperactivity - Holding Concerta while inpatient. -This medication has also been noted to cause transaminitis.  Malrotation, congenital - Patient with congenital intestinal malrotation; with component of umbilical hernia which appears to be currently incarcerated. -General surgery has been invited to the case and per discussion planning to fix at the moment of cholecystectomy.    Advance Care Planning:   Code Status: Full Code   Consults: Gastroenterology service and general surgery service.  Family Communication: Father at bedside.  Severity of Illness: The appropriate patient status for this patient is INPATIENT. Inpatient status is judged to be reasonable and necessary in order to provide the required intensity of service to ensure the patient's safety. The patient's presenting symptoms, physical exam findings, and initial radiographic and laboratory data in the context of their chronic comorbidities is felt to place them  at high risk for further clinical deterioration. Furthermore, it is not anticipated that the patient will be medically stable for discharge from the hospital within 2 midnights of admission.   * I certify that at the point of admission it is my clinical judgment that the patient will require inpatient hospital care spanning beyond 2 midnights from the point of admission due to high intensity of service, high risk for further  deterioration and high frequency of surveillance required.*  Author: Vassie Loll, MD 12/25/2021 9:23 AM  For on call review [email or web address].

## 2021-12-25 NOTE — Consult Note (Signed)
[facility] Consult  Reason for Consult: Gallstones pancreatitis Referring Physician: Dr. Gwenlyn Perking   Chief Complaint   Chest Pain     HPI: Brandon Dominguez is a 36 y.o. male with complaints of epigastric pain and associated nausea and vomiting.  The pain had been going on for about 4 days and was waxing and waning. He was unable to really eat anything and was having very small Bms. He says anytime he ate he had pain that was central in the epigastric region/ lower chest region and radiated to the back.  He was evaluated in the Ed and noted to have gallstones on [place] and an elevated lipase. MRCP demonstrated gallstone pancreatitis and no choledocholithiasis.      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 He learned about his malrotation in 2015 during a CT scan for diverticulitis concern.  He also has an umbilical hernia that has grown over the course of the last few years and causes him some discomfort at times.   He says his abdominal pain in the epigastric region is better. He wants to try some clear liquids.   Past Medical History:  Diagnosis Date   Allergy    Asthma    Diabetes mellitus without complication (HCC)    Diverticulitis    Diverticulosis    GERD (gastroesophageal reflux disease) 12/25/2021   Hepatic steatosis    Malrotation of intestine    Pancreatitis     Past Surgical History:  Procedure Laterality Date   cranial surgery     eye socket fracture   EYE SURGERY     Put plate in    Family History  Problem Relation Age of Onset   Diverticulitis Mother    Colon polyps Mother    Diabetes Paternal Grandmother    Diabetes Paternal Grandfather    Heart disease Paternal Grandfather    Heart disease Paternal Uncle    Heart disease Paternal Aunt    Colon cancer Neg Hx    Kidney disease Neg Hx    Liver disease Neg Hx     Social History   Tobacco Use   Smoking status: Never   Smokeless tobacco: Never  Vaping Use    Vaping Use: Never used  Substance Use Topics   Alcohol use: Yes    Comment: occassional   Drug use: No    Medications: I have reviewed the patient's current medications. Prior to Admission:  Medications Prior to Admission  Medication Sig Dispense Refill Last Dose   CONCERTA 27 MG CR tablet Take 1 tablet (27 mg total) by mouth every morning. Take only on workdays. (Patient not taking: Reported on 12/25/2021) 30 tablet 0 Not Taking   CONCERTA 27 MG CR tablet Take 1 tablet (27 mg total) by mouth every morning. Fill 30 days after prescription date (Patient not taking: Reported on 12/25/2021) 30 tablet 0 Not Taking   methylphenidate (CONCERTA) 27 MG PO CR tablet Take 1 tablet (27 mg total) by mouth every morning. Fill 60 days after prescription date. (Patient not taking: Reported on 12/25/2021) 30 tablet 0 Not Taking   pantoprazole (PROTONIX) 40 MG tablet Take 1 tablet (40 mg total) by mouth daily. Take one tablet in the morning 30 minutes before breakfast. (Patient not taking: Reported on 12/25/2021) 30 tablet 0 Not Taking   Scheduled:  Chlorhexidine Gluconate Cloth  6 each Topical Once   And   Chlorhexidine Gluconate Cloth  6 each Topical Once   heparin  injection (subcutaneous)  5,000 Units Subcutaneous Q8H   pantoprazole (PROTONIX) IV  40 mg Intravenous Daily   Continuous:  sodium chloride 175 mL/hr at 12/25/21 1415   [START ON 12/26/2021] cefoTEtan (CEFOTAN) IV     ZOX:WRUEAVWUJWJXB (DILAUDID) injection, ondansetron (ZOFRAN) IV  No Known Allergies   ROS:  A comprehensive review of systems was negative except for: Gastrointestinal: positive for abdominal pain, nausea, and vomiting  Blood pressure (!) 142/94, pulse 81, temperature 98.2 F (36.8 C), temperature source Oral, resp. rate 17, height  (1.803 m), weight 122.5 kg, SpO2 98 %. Physical Exam Vitals reviewed.  HENT:     Head: Normocephalic.  Eyes:     Pupils: Pupils are equal, round, and reactive to light.  Cardiovascular:      Rate and Rhythm: Normal rate.  Pulmonary:     Effort: Pulmonary effort is normal.  Abdominal:     Palpations: Abdomen is soft.     Tenderness: There is abdominal tenderness.     Hernia: A hernia is present. Hernia is present in the umbilical area.     Comments: Incarcerated hernia at the umbilicus  Musculoskeletal:        General: Normal range of motion.     Cervical back: Normal range of motion.  Skin:    General: Skin is warm.  Neurological:     General: No focal deficit present.     Mental Status: He is alert.  Psychiatric:        Mood and Affect: Mood normal.     Results: Results for orders placed or performed during the hospital encounter of 12/25/21 (from the past 48 hour(s))  Basic metabolic panel     Status: Abnormal   Collection Time: 12/25/21  3:42 AM  Result Value Ref Range   Sodium 136 135 - 145 mmol/L   Potassium 3.5 3.5 - 5.1 mmol/L   Chloride 101 98 - 111 mmol/L   CO2 23 22 - 32 mmol/L   Glucose, Bld 226 (H) 70 - 99 mg/dL    Comment: Glucose reference range applies only to samples taken after fasting for at least 8 hours.   BUN 20 6 - 20 mg/dL   Creatinine, Ser 1.47 0.61 - 1.24 mg/dL   Calcium 9.0 8.9 - 82.9 mg/dL   GFR, Estimated >56 >21 mL/min    Comment: (NOTE) Calculated using the CKD-EPI Creatinine Equation (2021)    Anion gap 12 5 - 15    Comment: Performed at [facility], [address], [place], [place] [address]  CBC     Status: Abnormal   Collection Time: 12/25/21  3:42 AM  Result Value Ref Range   WBC 12.8 (H) 4.0 - 10.5 K/uL   RBC 5.07 4.22 - 5.81 MIL/uL   Hemoglobin 15.7 13.0 - 17.0 g/dL   HCT 78.4 69.6 - 29.5 %   MCV 92.1 80.0 - 100.0 fL   MCH 31.0 26.0 - 34.0 pg   MCHC 33.6 30.0 - 36.0 g/dL   RDW 28.4 13.2 - 44.0 %   Platelets 286 150 - 400 K/uL   nRBC 0.0 0.0 - 0.2 %    Comment: Performed at [facility], [address], [place], [place] [address]  Troponin I (High Sensitivity)     Status: None   Collection Time: 12/25/21  3:42  AM  Result Value Ref Range   Troponin I (High Sensitivity) 4 <18 ng/L    Comment: (NOTE) Elevated high sensitivity troponin I (hsTnI) values and  significant  changes across serial measurements may suggest ACS but many other  chronic and acute conditions are known to elevate hsTnI results.  Refer to the "Links" section for chest pain algorithms and additional  guidance. Performed at [facility], [address] [place], [place] [address]   Hemoglobin A1c     Status: Abnormal   Collection Time: 12/25/21  3:42 AM  Result Value Ref Range   Hgb A1c MFr Bld 7.7 (H) 4.8 - 5.6 %    Comment: (NOTE) Pre diabetes:          5.7%-6.4%  Diabetes:              >6.4%  Glycemic control for   <7.0% adults with diabetes    Mean Plasma Glucose 174.29 mg/dL    Comment: Performed at [facility], [address] [place], [place] [address]  HIV Antibody (routine testing w rflx)     Status: None   Collection Time: 12/25/21  3:42 AM  Result Value Ref Range   HIV Screen 4th Generation wRfx Non Reactive Non Reactive    Comment: Performed at [facility], [address] [place], [place] [address]  Hepatic function panel     Status: Abnormal   Collection Time: 12/25/21  6:34 AM  Result Value Ref Range   Total Protein 7.6 6.5 - 8.1 g/dL   Albumin 4.0 3.5 - 5.0 g/dL   AST 253448 (H) 15 - 41 U/L   ALT 979 (H) 0 - 44 U/L   Alkaline Phosphatase 196 (H) 38 - 126 U/L   Total Bilirubin 4.6 (H) 0.3 - 1.2 mg/dL   Bilirubin, Direct 2.9 (H) 0.0 - 0.2 mg/dL   Indirect Bilirubin 1.7 (H) 0.3 - 0.9 mg/dL    Comment: Performed at [facility], [address] [place], [place] [address]  Lipase, blood     Status: Abnormal   Collection Time: 12/25/21  6:34 AM  Result Value Ref Range   Lipase 994 (H) 11 - 51 U/L    Comment: RESULTS CONFIRMED BY MANUAL DILUTION Performed at [facility], [address] [place], [place] [address]   Lipid panel     Status: Abnormal   Collection Time: 12/25/21  6:34 AM   Result Value Ref Range   Cholesterol 200 0 - 200 mg/dL   Triglycerides 595164 (H) <150 mg/dL   HDL 63 >63>40 mg/dL   Total CHOL/HDL Ratio 3.2 RATIO   VLDL 33 0 - 40 mg/dL   LDL Cholesterol 875104 (H) 0 - 99 mg/dL    Comment:        Total Cholesterol/HDL:CHD Risk Coronary Heart Disease Risk Table                     Men   Women  1/2 Average Risk   3.4   3.3  Average Risk       5.0   4.4  2 X Average Risk   9.6   7.1  3 X Average Risk  23.4   11.0        Use the calculated Patient Ratio above and the CHD Risk Table to determine the patient's CHD Risk.        ATP III CLASSIFICATION (LDL):  <100     mg/dL   Optimal  [id number]  mg/dL   Near or Above                    Optimal  130-159  mg/dL   Borderline  409-811  mg/dL   High  >914     mg/dL   Very High Performed at [facility], [address], [place], [place] [address]    Personally reviewed MRCP, [place] and ordered CT a/p and have reviewed-Gallstone pancreatitis, large umbilical hernia with fat, 2.5cm defect, omentum in the hernia, malrotation with small bowel on the left and colon on the right  CT ABDOMEN PELVIS WO CONTRAST  Result Date: 12/25/2021 CLINICAL DATA:  Right lower quadrant pain x3 days, umbilical hernia EXAM: CT ABDOMEN AND PELVIS WITHOUT CONTRAST TECHNIQUE: Multidetector CT imaging of the abdomen and pelvis was performed following the standard protocol without IV contrast. RADIATION DOSE REDUCTION: This exam was performed according to the departmental dose-optimization program which includes automated exposure control, adjustment of the mA and/or kV according to patient size and/or use of iterative reconstruction technique. COMPARISON:  08/03/2013 FINDINGS: Lower chest: Small linear densities seen in the lower lung fields suggesting scarring or minimal subsegmental atelectasis. Hepatobiliary: No focal abnormality is seen in the liver. There is no dilation of bile ducts. There is mild stranding in the fat planes adjacent to  gallbladder. Gallbladder is not distended. Pancreas: There is mild stranding in the peripancreatic fat. There are no loculated fluid collections in or around the pancreas. There is no dilation of pancreatic duct. Spleen: Unremarkable. Adrenals/Urinary Tract: Adrenals are unremarkable. There is no hydronephrosis. There is subtle increase in density in the some of the minor calices. Significance of this finding is not clear. This may be related to patient's hydration status or suggest presence of tiny renal stones. No discrete measurable calculi are noted. There are few smooth marginated low-density lesions in the kidneys largest in the upper pole of right kidney measuring 3.1 cm. Findings suggest possible renal cysts. Some of the smaller lesions could not be optimally characterized. Stomach/Bowel: Stomach is not distended. Small bowel loops are not dilated. Most of the small bowel loops are noted in the right side of the abdomen with most of the colon lying in the left side of the abdomen suggesting congenital malrotation. Appendix is not dilated. Numerous diverticula are seen in the colon without signs of focal acute diverticulitis. 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 Reproductive: There are few coarse calcifications in the prostate. Other: There is no ascites or pneumoperitoneum. There is significant interval increase in size of umbilical hernia. Umbilical hernial sac measures proximally 13.5 x 11.1 x 8.7 cm. There is fat within the large umbilical hernia. No bowel loops are noted. There is minimal stranding in the fat planes within the hernia without any loculated fluid collections. Musculoskeletal: No acute findings are seen. IMPRESSION: There is stranding in the fat planes adjacent to pancreas and gallbladder. Findings suggest possible mild pancreatitis and possibly cholecystitis. Please correlate with clinical symptoms and laboratory findings and consider follow-up sonogram.  There is no dilation of bile ducts. There is large umbilical hernia measuring 13.5 cm in maximum diameter containing fat. There is minimal stranding in the fat planes in the hernial sac which may suggest mild acute or chronic inflammation. There is no loculated fluid collection within the umbilical hernia. There are no bowel loops within the hernia. Diverticulosis of colon without signs of focal diverticulitis. Appendix is not dilated. Other findings as described in the body of the report. Electronically Signed   By: Ernie Avena M.D.   On: 12/25/2021 14:44   MR ABDOMEN MRCP W WO CONTAST  Result Date:  12/25/2021 CLINICAL DATA:  Pancreatitis, acute, severe right upper quadrant abdominal pain. EXAM: MRI ABDOMEN WITHOUT AND WITH CONTRAST (INCLUDING MRCP) TECHNIQUE: Multiplanar multisequence MR imaging of the abdomen was performed both before and after the administration of intravenous contrast. Heavily T2-weighted images of the biliary and pancreatic ducts were obtained, and three-dimensional MRCP images were rendered by post processing. CONTRAST:  64mL GADAVIST GADOBUTROL 1 MMOL/ML IV SOLN COMPARISON:  Ultrasound December 25, 2021 and CT January 01, 2014 FINDINGS: Lower chest: No acute abnormality. Hepatobiliary: Diffuse hepatic steatosis. No suspicious hepatic lesion. Tiny cholelithiasis without evidence of acute cholecystitis. No biliary ductal dilation or choledocholithiasis. Pancreas: Inflammation about the pancreatic centered along the pancreaticoduodenal groove without nonenhancing portions of pancreatic parenchyma, pancreatic ductal dilation or walled off fluid collection. Spleen: No splenomegaly or focal splenic lesion. Left upper quadrant splenules. Adrenals/Urinary Tract: Bilateral adrenal glands appear normal. No hydronephrosis. Fluid intensity benign right renal cysts require no imaging follow-up. No suspicious renal mass. Stomach/Bowel: Inflammation along the proximal duodenum is favored reactive.  Extensive colonic diverticulosis without findings of acute diverticulitis. Vascular/Lymphatic: No pathologically enlarged lymph nodes identified. No abdominal aortic aneurysm demonstrated. Other:  No significant abdominal free fluid. Musculoskeletal: No suspicious bone lesions identified. IMPRESSION: 1. Acute interstitial pancreatitis with reactive duodenitis. No walled off fluid collection or pancreatic ductal dilation. 2. Cholelithiasis without findings of acute cholecystitis. No choledocholithiasis or biliary ductal dilation. Electronically Signed   By: Maudry Mayhew M.D.   On: 12/25/2021 11:19   MR 3D Recon At Scanner  Result Date: 12/25/2021 CLINICAL DATA:  Pancreatitis, acute, severe right upper quadrant abdominal pain. EXAM: MRI ABDOMEN WITHOUT AND WITH CONTRAST (INCLUDING MRCP) TECHNIQUE: Multiplanar multisequence MR imaging of the abdomen was performed both before and after the administration of intravenous contrast. Heavily T2-weighted images of the biliary and pancreatic ducts were obtained, and three-dimensional MRCP images were rendered by post processing. CONTRAST:  90mL GADAVIST GADOBUTROL 1 MMOL/ML IV SOLN COMPARISON:  Ultrasound December 25, 2021 and CT January 01, 2014 FINDINGS: Lower chest: No acute abnormality. Hepatobiliary: Diffuse hepatic steatosis. No suspicious hepatic lesion. Tiny cholelithiasis without evidence of acute cholecystitis. No biliary ductal dilation or choledocholithiasis. Pancreas: Inflammation about the pancreatic centered along the pancreaticoduodenal groove without nonenhancing portions of pancreatic parenchyma, pancreatic ductal dilation or walled off fluid collection. Spleen: No splenomegaly or focal splenic lesion. Left upper quadrant splenules. Adrenals/Urinary Tract: Bilateral adrenal glands appear normal. No hydronephrosis. Fluid intensity benign right renal cysts require no imaging follow-up. No suspicious renal mass. Stomach/Bowel: Inflammation along the proximal  duodenum is favored reactive. Extensive colonic diverticulosis without findings of acute diverticulitis. Vascular/Lymphatic: No pathologically enlarged lymph nodes identified. No abdominal aortic aneurysm demonstrated. Other:  No significant abdominal free fluid. Musculoskeletal: No suspicious bone lesions identified. IMPRESSION: 1. Acute interstitial pancreatitis with reactive duodenitis. No walled off fluid collection or pancreatic ductal dilation. 2. Cholelithiasis without findings of acute cholecystitis. No choledocholithiasis or biliary ductal dilation. Electronically Signed   By: Maudry Mayhew M.D.   On: 12/25/2021 11:19   US ABDOMEN LIMITED RUQ (LIVER/GB)  Result Date: 12/25/2021 CLINICAL DATA:  Postprandial upper abdominal pain for 4 days EXAM: ULTRASOUND ABDOMEN LIMITED RIGHT UPPER QUADRANT COMPARISON:  08/03/2013 CT abdomen/pelvis FINDINGS: Gallbladder: No gallstones or wall thickening visualized. No sonographic Murphy sign noted by sonographer. Common bile duct: Diameter: 7 mm Liver: Diffusely echogenic liver parenchyma with posterior acoustic attenuation, compatible with diffuse hepatic steatosis. No definite liver surface irregularity. Crescentic subcapsular hypoechoic liver focus adjacent to the gallbladder is compatible with focal  fatty sparing. No liver masses, noting decreased sensitivity in the setting of an echogenic liver. Portal vein is patent on color Doppler imaging with normal direction of blood flow towards the liver. Other: None. IMPRESSION: 1. Mild common bile duct dilation (7 mm diameter). Recommend correlation with serum bilirubin level. MRI abdomen without and with IV contrast with MRCP may be considered as clinically warranted. 2. Normal gallbladder, with no cholelithiasis. 3. Diffuse hepatic steatosis with focal fatty sparing adjacent to the gallbladder. Electronically Signed   By: Delbert Phenix M.D.   On: 12/25/2021 08:10   DG Chest 2 View  Result Date: 12/25/2021 CLINICAL  DATA:  Chest pain. EXAM: CHEST - 2 VIEW COMPARISON:  None Available. FINDINGS: Shallow inspiration minimal left lung base atelectasis. No focal consolidation, pleural effusion, or pneumothorax. The cardiac silhouette is within normal limits. No acute osseous pathology. IMPRESSION: No active cardiopulmonary disease. Electronically Signed   By: Elgie Collard M.D.   On: 12/25/2021 03:14     Assessment & Plan:  Durk Carmen is a 36 y.o. male with gallstone pancreatitis, known malrotation, known umbilical hernia with a large amount of omentum, no bowel and a 2.5cm defect.   -PLAN: I counseled the patient about the indication, risks and benefits of laparoscopic cholecystectomy.  He understands there is a very small chance for bleeding, infection, injury to normal structures (including common bile duct), conversion to open surgery, persistent symptoms, evolution of postcholecystectomy diarrhea, need for secondary interventions, anesthesia reaction, cardiopulmonary issues and other risks not specifically detailed here. I described the expected recovery, the plan for follow-up and the restrictions during the recovery phase.  All questions were answered.  Discussed also addressing any ladd's bands if present given the risk of volvulus and obstruction with malrotation. Discussed that we can lyse these if evident. Discussed repair of the umbilical hernia and possible primary repair with permanent suture versus mesh. I would prefer to not do a mesh given the cholecystectomy but if too large we may have to pursue mesh and risk the small chance of infection. Discussed risk of recurrence with hernia repair.   All questions were answered to the satisfaction of the patient and family, called his mother and updated her.  OR tomorrow. NPO midnight Lipase in AM Clear diet now    Lucretia Roers 12/25/2021, 4:41 PM

## 2021-12-26 ENCOUNTER — Encounter (HOSPITAL_COMMUNITY): Payer: Self-pay | Admitting: Internal Medicine

## 2021-12-26 ENCOUNTER — Encounter (HOSPITAL_COMMUNITY)
Admission: EM | Disposition: A | Discharge status: Home / Self Care | Payer: Self-pay | Source: Home / Self Care | Attending: General Surgery

## 2021-12-26 ENCOUNTER — Inpatient Hospital Stay (HOSPITAL_COMMUNITY): Payer: BC Managed Care – PPO | Admitting: Anesthesiology

## 2021-12-26 ENCOUNTER — Other Ambulatory Visit: Payer: Self-pay

## 2021-12-26 DIAGNOSIS — R7401 Elevation of levels of liver transaminase levels: Secondary | ICD-10-CM

## 2021-12-26 DIAGNOSIS — E785 Hyperlipidemia, unspecified: Secondary | ICD-10-CM

## 2021-12-26 DIAGNOSIS — E1169 Type 2 diabetes mellitus with other specified complication: Secondary | ICD-10-CM

## 2021-12-26 DIAGNOSIS — E6609 Other obesity due to excess calories: Secondary | ICD-10-CM | POA: Diagnosis not present

## 2021-12-26 DIAGNOSIS — F988 Other specified behavioral and emotional disorders with onset usually occurring in childhood and adolescence: Secondary | ICD-10-CM | POA: Diagnosis not present

## 2021-12-26 DIAGNOSIS — K851 Biliary acute pancreatitis without necrosis or infection: Secondary | ICD-10-CM

## 2021-12-26 DIAGNOSIS — K859 Acute pancreatitis without necrosis or infection, unspecified: Secondary | ICD-10-CM

## 2021-12-26 DIAGNOSIS — K219 Gastro-esophageal reflux disease without esophagitis: Secondary | ICD-10-CM | POA: Diagnosis not present

## 2021-12-26 HISTORY — PX: CHOLECYSTECTOMY: SHX55

## 2021-12-26 HISTORY — PX: OPEN RELEASE OF LADD BANDS PEDIATRIC: SHX6757

## 2021-12-26 HISTORY — PX: UMBILICAL HERNIA REPAIR: SHX196

## 2021-12-26 LAB — COMPREHENSIVE METABOLIC PANEL
ALT: 557 U/L — ABNORMAL HIGH (ref 0–44)
AST: 151 U/L — ABNORMAL HIGH (ref 15–41)
Albumin: 3.1 g/dL — ABNORMAL LOW (ref 3.5–5.0)
Alkaline Phosphatase: 146 U/L — ABNORMAL HIGH (ref 38–126)
Anion gap: 6 (ref 5–15)
BUN: 13 mg/dL (ref 6–20)
CO2: 23 mmol/L (ref 22–32)
Calcium: 8 mg/dL — ABNORMAL LOW (ref 8.9–10.3)
Chloride: 109 mmol/L (ref 98–111)
Creatinine, Ser: 0.68 mg/dL (ref 0.61–1.24)
GFR, Estimated: >60 mL/min (ref >60)
Glucose, Bld: 132 mg/dL — ABNORMAL HIGH (ref 70–99)
Potassium: 3.3 mmol/L — ABNORMAL LOW (ref 3.5–5.1)
Sodium: 138 mmol/L (ref 135–145)
Total Bilirubin: 1.5 mg/dL — ABNORMAL HIGH (ref 0.3–1.2)
Total Protein: 5.9 g/dL — ABNORMAL LOW (ref 6.5–8.1)

## 2021-12-26 LAB — GLUCOSE, CAPILLARY
Glucose-Capillary: 139 mg/dL — ABNORMAL HIGH (ref 70–99)
Glucose-Capillary: 188 mg/dL — ABNORMAL HIGH (ref 70–99)

## 2021-12-26 LAB — CBC
HCT: 40 % (ref 39.0–52.0)
Hemoglobin: 13.3 g/dL (ref 13.0–17.0)
MCH: 30.9 pg (ref 26.0–34.0)
MCHC: 33.3 g/dL (ref 30.0–36.0)
MCV: 93 fL (ref 80.0–100.0)
Platelets: 201 10*3/uL (ref 150–400)
RBC: 4.3 MIL/uL (ref 4.22–5.81)
RDW: 13.2 % (ref 11.5–15.5)
WBC: 8.4 10*3/uL (ref 4.0–10.5)
nRBC: 0 % (ref 0.0–0.2)

## 2021-12-26 LAB — LIPASE, BLOOD: Lipase: 135 U/L — ABNORMAL HIGH (ref 11–51)

## 2021-12-26 SURGERY — LAPAROSCOPIC CHOLECYSTECTOMY
Anesthesia: General | Site: Abdomen

## 2021-12-26 MED ORDER — FENTANYL CITRATE (PF) 250 MCG/5ML IJ SOLN
INTRAMUSCULAR | Status: AC
  Filled 2021-12-26: qty 5

## 2021-12-26 MED ORDER — FENTANYL CITRATE (PF) 100 MCG/2ML IJ SOLN
INTRAMUSCULAR | Status: AC
  Filled 2021-12-26: qty 2

## 2021-12-26 MED ORDER — LIDOCAINE HCL (PF) 2 % IJ SOLN
INTRAMUSCULAR | Status: AC
  Filled 2021-12-26: qty 10

## 2021-12-26 MED ORDER — ROCURONIUM BROMIDE 10 MG/ML (PF) SYRINGE
PREFILLED_SYRINGE | INTRAVENOUS | Status: AC
  Filled 2021-12-26: qty 20

## 2021-12-26 MED ORDER — DEXAMETHASONE SODIUM PHOSPHATE 10 MG/ML IJ SOLN
INTRAMUSCULAR | Status: AC
  Filled 2021-12-26: qty 1

## 2021-12-26 MED ORDER — CHLORHEXIDINE GLUCONATE 0.12 % MT SOLN
15.0000 mL | Freq: Once | OROMUCOSAL | Status: AC
  Administered 2021-12-26: 15 mL via OROMUCOSAL

## 2021-12-26 MED ORDER — ORAL CARE MOUTH RINSE: 15.0000 mL | Freq: Once | OROMUCOSAL | Status: AC

## 2021-12-26 MED ORDER — PROPOFOL 10 MG/ML IV BOLUS
INTRAVENOUS | Status: AC
  Filled 2021-12-26: qty 20

## 2021-12-26 MED ORDER — ONDANSETRON HCL 4 MG/2ML IJ SOLN
INTRAMUSCULAR | Status: DC | PRN
  Administered 2021-12-26: 4 mg via INTRAVENOUS

## 2021-12-26 MED ORDER — FENTANYL CITRATE PF 50 MCG/ML IJ SOSY
25.0000 ug | PREFILLED_SYRINGE | INTRAMUSCULAR | Status: DC | PRN
  Administered 2021-12-26 (×2): 50 ug via INTRAVENOUS
  Filled 2021-12-26 (×2): qty 1

## 2021-12-26 MED ORDER — SODIUM CHLORIDE 0.9 % IR SOLN
Status: DC | PRN
  Administered 2021-12-26: 1000 mL

## 2021-12-26 MED ORDER — KETOROLAC TROMETHAMINE 30 MG/ML IJ SOLN
30.0000 mg | Freq: Once | INTRAMUSCULAR | Status: AC
  Administered 2021-12-26: 30 mg via INTRAVENOUS

## 2021-12-26 MED ORDER — TIZANIDINE HCL 2 MG PO TABS
2.0000 mg | ORAL_TABLET | Freq: Three times a day (TID) | ORAL | Status: DC | PRN
  Administered 2021-12-26: 2 mg via ORAL
  Filled 2021-12-26: qty 1

## 2021-12-26 MED ORDER — FENTANYL CITRATE (PF) 250 MCG/5ML IJ SOLN
INTRAMUSCULAR | Status: DC | PRN
  Administered 2021-12-26 (×2): 50 ug via INTRAVENOUS
  Administered 2021-12-26: 100 ug via INTRAVENOUS

## 2021-12-26 MED ORDER — BUPIVACAINE HCL (PF) 0.5 % IJ SOLN
INTRAMUSCULAR | Status: AC
  Filled 2021-12-26: qty 30

## 2021-12-26 MED ORDER — SUCCINYLCHOLINE CHLORIDE 200 MG/10ML IV SOSY
PREFILLED_SYRINGE | INTRAVENOUS | Status: DC | PRN
  Administered 2021-12-26: 120 mg via INTRAVENOUS

## 2021-12-26 MED ORDER — KETOROLAC TROMETHAMINE 30 MG/ML IJ SOLN
INTRAMUSCULAR | Status: AC
  Filled 2021-12-26: qty 1

## 2021-12-26 MED ORDER — LIDOCAINE 2% (20 MG/ML) 5 ML SYRINGE
INTRAMUSCULAR | Status: DC | PRN
  Administered 2021-12-26: 100 mg via INTRAVENOUS

## 2021-12-26 MED ORDER — PROPOFOL 10 MG/ML IV BOLUS
INTRAVENOUS | Status: DC | PRN
  Administered 2021-12-26: 180 mg via INTRAVENOUS
  Administered 2021-12-26: 20 mg via INTRAVENOUS

## 2021-12-26 MED ORDER — LACTATED RINGERS IV SOLN: INTRAVENOUS | Status: DC

## 2021-12-26 MED ORDER — BUPIVACAINE HCL (PF) 0.5 % IJ SOLN
INTRAMUSCULAR | Status: DC | PRN
  Administered 2021-12-26: 30 mL

## 2021-12-26 MED ORDER — ONDANSETRON HCL 4 MG/2ML IJ SOLN: 4.0000 mg | Freq: Once | INTRAMUSCULAR | Status: DC | PRN

## 2021-12-26 MED ORDER — ROCURONIUM BROMIDE 10 MG/ML (PF) SYRINGE
PREFILLED_SYRINGE | INTRAVENOUS | Status: DC | PRN
  Administered 2021-12-26: 10 mg via INTRAVENOUS
  Administered 2021-12-26: 40 mg via INTRAVENOUS
  Administered 2021-12-26 (×2): 20 mg via INTRAVENOUS
  Administered 2021-12-26: 50 mg via INTRAVENOUS
  Administered 2021-12-26: 20 mg via INTRAVENOUS

## 2021-12-26 MED ORDER — SUCCINYLCHOLINE CHLORIDE 200 MG/10ML IV SOSY
PREFILLED_SYRINGE | INTRAVENOUS | Status: AC
  Filled 2021-12-26: qty 10

## 2021-12-26 MED ORDER — OXYCODONE HCL 5 MG PO TABS
5.0000 mg | ORAL_TABLET | ORAL | Status: DC | PRN
  Administered 2021-12-26 – 2021-12-27 (×3): 10 mg via ORAL
  Filled 2021-12-26 (×3): qty 2

## 2021-12-26 MED ORDER — HEMOSTATIC AGENTS (NO CHARGE) OPTIME
TOPICAL | Status: DC | PRN
  Administered 2021-12-26: 1 via TOPICAL

## 2021-12-26 MED ORDER — SUGAMMADEX SODIUM 200 MG/2ML IV SOLN
INTRAVENOUS | Status: DC | PRN
  Administered 2021-12-26: 200 mg via INTRAVENOUS
  Administered 2021-12-26: 100 mg via INTRAVENOUS

## 2021-12-26 MED ORDER — MIDAZOLAM HCL 2 MG/2ML IJ SOLN
INTRAMUSCULAR | Status: AC
  Filled 2021-12-26: qty 2

## 2021-12-26 MED ORDER — DEXAMETHASONE SODIUM PHOSPHATE 10 MG/ML IJ SOLN
INTRAMUSCULAR | Status: DC | PRN
  Administered 2021-12-26: 10 mg via INTRAVENOUS

## 2021-12-26 MED ORDER — MIDAZOLAM HCL 5 MG/5ML IJ SOLN
INTRAMUSCULAR | Status: DC | PRN
  Administered 2021-12-26: 2 mg via INTRAVENOUS

## 2021-12-26 SURGICAL SUPPLY — 60 items
APPLIER CLIP ROT 10 11.4 M/L (STAPLE) ×2
BAG RETRIEVAL 10 (BASKET) ×2
BLADE SURG 15 STRL LF DISP TIS (BLADE) ×1 IMPLANT
BLADE SURG 15 STRL SS (BLADE) ×1
CHLORAPREP W/TINT 26 (MISCELLANEOUS) ×2 IMPLANT
CLIP APPLIE ROT 10 11.4 M/L (STAPLE) ×1 IMPLANT
CLOTH BEACON ORANGE TIMEOUT ST (SAFETY) ×2 IMPLANT
COVER LIGHT HANDLE STERIS (MISCELLANEOUS) ×4 IMPLANT
CUTTER FLEX LINEAR 45M (STAPLE) ×1 IMPLANT
DECANTER SPIKE VIAL GLASS SM (MISCELLANEOUS) ×2 IMPLANT
DERMABOND ADVANCED (GAUZE/BANDAGES/DRESSINGS) ×2
DERMABOND ADVANCED .7 DNX12 (GAUZE/BANDAGES/DRESSINGS) ×1 IMPLANT
DISSECTOR BLUNT TIP ENDO 5MM (MISCELLANEOUS) ×2 IMPLANT
DRSG VASELINE 3X18 (GAUZE/BANDAGES/DRESSINGS) ×1 IMPLANT
ELECT CAUTERY BLADE 6.4 (BLADE) ×1 IMPLANT
ELECT REM PT RETURN 9FT ADLT (ELECTROSURGICAL) ×2
ELECTRODE REM PT RTRN 9FT ADLT (ELECTROSURGICAL) ×1 IMPLANT
GAUZE 4X4 16PLY ~~LOC~~+RFID DBL (SPONGE) ×1 IMPLANT
GLOVE BIO SURGEON STRL SZ 6.5 (GLOVE) ×3 IMPLANT
GLOVE BIOGEL PI IND STRL 6.5 (GLOVE) ×1 IMPLANT
GLOVE BIOGEL PI IND STRL 7.0 (GLOVE) ×2 IMPLANT
GLOVE BIOGEL PI INDICATOR 6.5 (GLOVE) ×3
GLOVE BIOGEL PI INDICATOR 7.0 (GLOVE) ×3
GLOVE SURG SS PI 6.5 STRL IVOR (GLOVE) ×2 IMPLANT
GLOVE SURG SS PI 7.0 STRL IVOR (GLOVE) ×1 IMPLANT
GOWN STRL REUS W/TWL LRG LVL3 (GOWN DISPOSABLE) ×8 IMPLANT
HEMOSTAT SNOW SURGICEL 2X4 (HEMOSTASIS) ×2 IMPLANT
INST SET LAPROSCOPIC AP (KITS) ×2 IMPLANT
KIT TURNOVER KIT A (KITS) ×2 IMPLANT
LIGASURE LAP ATLAS 10MM 37CM (INSTRUMENTS) ×1 IMPLANT
MANIFOLD NEPTUNE II (INSTRUMENTS) ×2 IMPLANT
NDL INSUFFLATION 14GA 120MM (NEEDLE) ×1 IMPLANT
NEEDLE INSUFFLATION 14GA 120MM (NEEDLE) ×2 IMPLANT
NS IRRIG 1000ML POUR BTL (IV SOLUTION) ×2 IMPLANT
PACK LAP CHOLE LZT030E (CUSTOM PROCEDURE TRAY) ×2 IMPLANT
PAD ARMBOARD 7.5X6 YLW CONV (MISCELLANEOUS) ×2 IMPLANT
PENCIL HANDSWITCHING (ELECTRODE) ×1 IMPLANT
RELOAD STAPLE 45 3.5 BLU ETS (ENDOMECHANICALS) IMPLANT
RELOAD STAPLE TA45 3.5 REG BLU (ENDOMECHANICALS) ×2 IMPLANT
SET BASIN LINEN APH (SET/KITS/TRAYS/PACK) ×2 IMPLANT
SET TUBE SMOKE EVAC HIGH FLOW (TUBING) ×2 IMPLANT
SLEEVE ENDOPATH XCEL 5M (ENDOMECHANICALS) ×1 IMPLANT
SLEEVE Z-THREAD 5X100MM (TROCAR) ×2 IMPLANT
SUT ETHIBOND 0 MO6 C/R (SUTURE) ×1 IMPLANT
SUT MNCRL AB 4-0 PS2 18 (SUTURE) ×6 IMPLANT
SUT VIC AB 3-0 SH 27 (SUTURE) ×4
SUT VIC AB 3-0 SH 27X BRD (SUTURE) IMPLANT
SUT VICRYL 0 UR6 27IN ABS (SUTURE) ×3 IMPLANT
SYS BAG RETRIEVAL 10MM (BASKET) ×2
SYSTEM BAG RETRIEVAL 10MM (BASKET) ×1 IMPLANT
TRAY FOLEY W/BAG SLVR 16FR (SET/KITS/TRAYS/PACK) ×1
TRAY FOLEY W/BAG SLVR 16FR ST (SET/KITS/TRAYS/PACK) IMPLANT
TROCAR BALLN 12MMX100 BLUNT (TROCAR) ×1 IMPLANT
TROCAR XCEL BLUNT TIP 100MML (ENDOMECHANICALS) ×1 IMPLANT
TROCAR Z-THRD FIOS HNDL 11X100 (TROCAR) ×2 IMPLANT
TROCAR Z-THREAD FIOS 5X100MM (TROCAR) ×2 IMPLANT
TROCAR Z-THREAD SLEEVE 11X100 (TROCAR) ×2 IMPLANT
TUBE CONNECTING 12X1/4 (SUCTIONS) ×2 IMPLANT
WARMER LAPAROSCOPE (MISCELLANEOUS) ×2 IMPLANT
YANKAUER SUCT BULB TIP NO VENT (SUCTIONS) ×1 IMPLANT

## 2021-12-26 NOTE — Anesthesia Procedure Notes (Signed)
Procedure Name: Intubation Date/Time: 12/26/2021 9:27 AM  Performed by: Lucinda Dell, CRNAPre-anesthesia Checklist: Patient identified, Emergency Drugs available, Suction available and Patient being monitored Patient Re-evaluated:Patient Re-evaluated prior to induction Oxygen Delivery Method: Circle system utilized Preoxygenation: Pre-oxygenation with 100% oxygen Induction Type: IV induction Ventilation: Mask ventilation without difficulty Laryngoscope Size: Glidescope and 3 Tube type: Oral Tube size: 7.5 mm Number of attempts: 1 Airway Equipment and Method: Stylet and Video-laryngoscopy Placement Confirmation: ETT inserted through vocal cords under direct vision, positive ETCO2 and breath sounds checked- equal and bilateral Secured at: 22 cm Tube secured with: Tape Dental Injury: Teeth and Oropharynx as per pre-operative assessment  Difficulty Due To: Difficulty was anticipated and Difficult Airway- due to anterior larynx Comments: Easy  mask airway. Glidescope used d/t anticipated difficult airway (MPIII), Good view with glide. Atraumatic oral intubation.

## 2021-12-26 NOTE — Progress Notes (Signed)
  Transition of Care [facility]) Screening Note   Patient Details  Name: Brandon Dominguez Date of Birth: 03/18/1986   [facility] of [facility]) CM/SW Contact:    Annice Needy, LCSW Phone Number: 12/26/2021, 2:52 PM    Transition of Care Department [facility]) has reviewed patient and no TOC needs have been identified at this time. We will continue to monitor patient advancement through interdisciplinary progression rounds. If new patient transition needs arise, please place a TOC consult.

## 2021-12-26 NOTE — Op Note (Signed)
Operative Note   Preoperative Diagnosis: Gallstone pancreatitis, malrotation, incarcerated umbilical hernia with omentum    Postoperative Diagnosis: Same   Procedure(s) Performed: Laparoscopic cholecystectomy, Laparoscopic Ladd's procedure (adhesions lysed, appendectomy, ensuring wide base of mesentery, colon on left, small bowel on right), primary umbilical hernia repair    Surgeon: Leatrice Jewels. Henreitta Leber, MD   Assistants: No qualified resident was available   Anesthesia: General endotracheal   Anesthesiologist: Windell Norfolk, MD    Specimens: Gallbladder    Estimated Blood Loss: Minimal    Blood Replacement: None    Complications: None    Operative Findings: Incarcerated umbilical hernia with omentum, normal appearing gallbladder, malrotation of the intestine with small bowel on the right and colon on the left, no volvulus, duodenum taking 180 degree turn and tracking back toward the liver with adhesive bands between the duodenum and right retroperitoneum, no ladd's bands extending to the cecum or ascending colon, wide mesentery with omentum separating the left sided colon and right sided small bowel, appendix normal                       Procedure: The patient was taken to the operating room and placed supine. General endotracheal anesthesia was induced. Intravenous antibiotics were administered per protocol. An orogastric tube positioned to decompress the stomach. The abdomen was prepared and draped in the usual sterile fashion.    A vertical supraumbilical incision was made over the area of the umbilical hernia with omentum. With care I opened the hernia sac and got around the omentum at the fascia level. The omentum was taken with a ligasure in addition to the hernia sac and these were sent to pathology. The defect was about 2.5cm. A Hassan 60mm port was used to gain access and secured with 0 Vicryl stay sutures, and pneumoperitoneum to 15 mmHg with carbon dioxide  was achieved. A 10 mm 0-degree operative laparoscope was introduced. The area underlying the trocar was without evidence of injury.  The abdomen was explored. The small bowel was on the right and the large bowel on the left as on the CT.  Remaining trocars were placed under direct vision. Two 5 mm ports were placed in the right abdomen, between the anterior axillary and midclavicular line.  A final 11 mm port was placed through the mid-epigastrium, near the falciform ligament.  Adhesions from the inferior level to the small bowel were taken down to allow for better manipulation of the gallbladder.    The gallbladder fundus was elevated cephalad and the infundibulum was retracted to the patient's right. The gallbladder/cystic duct junction was skeletonized. The cystic artery noted in the triangle of Calot and was also skeletonized.  We then continued liberal medial and lateral dissection until the critical view of safety was achieved.    The cystic duct and cystic artery were triply clipped and divided. The gallbladder was then dissected from the liver bed with electrocautery. The specimen was placed in an Endopouch and was retrieved through the epigastric site.  Hemostasis was confirmed. Surgical SNOW was placed in the gallbladder bed.    The 11 port was placed back into the epigastric site. We then turned our attention to the bowel. The patient was positioned for better visualization in the pelvis. The appendix and cecum were noted in the pelvic region just right of midline but to the left of the small bowel. No ladd's bands were noted between the cecum/ ascending colon and the right side of the  abdomen or the small intestine.  An additional 5 mm trocar was placed to the right of the umbilical 12 mm port to help aid in maneuvering the bowel. The camera was placed through the epigastric 11 mm port.   The appendix was carefully dissected. A window was made in the mesoappendix at the base of the appendix. The  appendix was divided at its base using a standard endo-GIA stapler.  The staple line was inspected and noted to be within normal limits. The mesoappendix was taken with the ligasure energy device. The appendix was placed within an Endocatch specimen bag. There was no evidence of bleeding, leakage, or complication after division of the appendix. The endocatch bag was removed via the 12 mm port in the epigastric region.   Once the appendectomy was completed, the small bowel was ran from the terminal ileum back with atraumatic graspers. This was ran all the way to the edge of the liver where the bowel was adhesions to the retroperitoneal surface and the duodenum took a 180 degree turn from heading caudal to back cranial toward the liver. These thin adhesions that could be seen through were carefully taken down with the ligasure and hook cautery. Care was taken to ensure no injury to the bowel and that the portal triad was not encountered.   Once this was down the duodenum was straight and no volvulus was noted. The mesenteric base was already wide and omentum was noted between the small bowel and large bowel (see photos) with a wide base.  There were again no adhesions between the large intestine and right side of the abdomen or the small bowel.  I did note two serosal injuries in the duodenum and these were repaired in Lembert fashion with 3-0 Vicryl suture and omentum was laid over the two areas to buttress the serosal injury.    Final inspection revealed acceptable hemostasis. Trocars were removed and pneumoperitoneum was released.  0 Vicryl fascial sutures were used to close the epigastric.  The umbilical hernia was closed with 0 Ethibond suture in a vest over pants fashion without tension.  The stay sutures of the [place] were cut. The deep space of the umbilical hernia was closed with 3-0 Vicryl interrupted to try to help close the area to prevent seroma formation.  The skin incisions were closed with 4-0  Monocryl subcuticular sutures and Dermabond. The patient was awakened from anesthesia and extubated without complication.    Algis Greenhouse, MD [facility] [address], [place] [address] [phone] (office)

## 2021-12-26 NOTE — Interval H&P Note (Signed)
History and Physical Interval Note:  12/26/2021 8:38 AM  Brandon Dominguez  has presented today for surgery, with the diagnosis of gallstone pancreatitis, malrotation, ventral hernia.  The various methods of treatment have been discussed with the patient and family. After consideration of risks, benefits and other options for treatment, the patient has consented to  Procedure(s): LAPAROSCOPIC CHOLECYSTECTOMY; possible open, possible ladd's band lysis, umbilical hernia repair ,possible mesh (N/A) as a surgical intervention.  The patient's history has been reviewed, patient examined, no change in status, stable for surgery.  I have reviewed the patient's chart and labs.  Questions were answered to the patient's satisfaction.    Viewed case with Dr. Lovell Sheehan and he agrees since I will be in there, should remove any ladd's bands and plan for appendectomy/ complete ladd's procedure. Discussed with patient and discussed added risk. Discussed we would not use mesh since we are doing an appendectomy. View Ct scan with patient, showed him the hernia and malrotation, gallbladder.   Lucretia Roers

## 2021-12-26 NOTE — OR Nursing (Signed)
Patients mother Aram Beecham updated on surgery.

## 2021-12-26 NOTE — Assessment & Plan Note (Signed)
-   Status post surgical repair. -Follow-up postoperative care per general surgery.

## 2021-12-26 NOTE — Progress Notes (Signed)
[facility]  Updated his sister about the appendectomy plan. Reviewed imaging with radiology one last time. Appendix and cecum are in the suprapubic region.  Curlene Labrum, MD [facility] [address] [place], [place] [address] [phone] (office)

## 2021-12-26 NOTE — Progress Notes (Signed)
Progress Note   Patient: Brandon Dominguez [id number] DOB: 1985/11/19 DOA: 12/25/2021     1 DOS: the patient was seen and examined on 12/26/2021   Brief hospital course: Hilal Raimondo is a 36 y.o. male with medical history significant of class II obesity, gastroesophageal flux disease, ADHD, history of diverticulosis, hepatic steatosis and prediabetes; who presented to the hospital secondary to abdominal pain, nausea/vomiting.  Symptoms has been present for the last 4-5 days and worsening; in the last 24 hours had difficulty keeping things down and reports discomfort worsen with oral intake.  He has not noticed any hematemesis, patient's pain mainly located in the epigastric area to mid abdomen, reports to be intermittent, radiates to lower aspect of his chest region and back, 8-9 out of 10 in intensity; relieved in 2 occasions after vomiting and worsening with oral intake.  Patient reports no fever, no sick contacts, no diarrhea, no melena, no hematochezia, no dysuria, no hematuria, no focal weaknesses or any other complaints.   In the ED abdominal ultrasound failed to demonstrate any gallstones but reveal mild dilatation of CBD.  Patient blood work remarkable for significant transaminitis and elevated lipase.  MRCP demonstrated gallstone pancreatitis and no choledocholithiasis.   TRH has been called to place patient in the hospital for further evaluation and management.   Assessment and Plan: * Acute pancreatitis - Without significant history of alcohol abuse or significant derangement appreciated on lipid panel. -MRCP demonstrating biliary pancreatitis origin. -No obstructing stone appreciated. -Patient taken by surgery service to the OR for cholecystectomy, Ladds procedure and umbilical hernia repair. -Lipase and LFTs significantly improved. -Continue supportive care, as needed analgesics and slowly advance diet as per recommendations by general surgery. -Continue postoperative care. -No nausea or  vomiting.  Incarcerated umbilical hernia - Status post surgical repair. -Follow-up postoperative care per general surgery.  Transaminitis -In the setting of biliary pancreatitis and hepatic steatosis -Hepatitis panel negative for acute infection. -MRCP demonstrating fatty liver changes and no other structural abnormalities. -Continue patient follow-up with GI service as an outpatient. -Lifestyle changes, modify carbohydrate and low-fat diet recommended.  GERD (gastroesophageal reflux disease) -Continue PPI.  Class 2 obesity due to excess calories in adult - Low calorie diet, portion control and increase physical activity discussed with patient. -Body mass index is 37.66 kg/m.   Prediabetes - A1c 7.3 -Patient expressed instructed to take metformin in the past; he has been not taking medications and just following diet control and lifestyle changes. -Continue sliding scale insulin while inpatient -Will recommend the use of metformin (starting at 500 mg twice a day using extended release form) along with continued lifestyle changes and modified carbohydrate diet at discharge.  Attention deficit disorder (ADD) without hyperactivity -Continue holding Concerta while inpatient. -This medication has also been noted to cause transaminitis. -As long as LFTs levels continue to improve/resolved this medication can be resumed at discharge.  Malrotation, congenital -Patient with congenital intestinal malrotation; with component of umbilical hernia which appears to be currently incarcerated. -Umbilical hernia has been repaired. -Status post surgical intervention with removal of appendix and cecum; postoperative care per general surgery service.   Subjective:  Afebrile, no chest pain, no nausea vomiting.  Appropriately tender in his abdomen after surgical intervention.  Physical Exam: Vitals:   12/26/21 1400 12/26/21 1415 12/26/21 1431 12/26/21 1444  BP: (!) 167/90 (!) 157/85 (!) 159/89  (!) 170/90  Pulse: 85 78 80 79  Resp: (!) 22 14 14 16   Temp:    98.5 F (36.9  C)  TempSrc:      SpO2: 96% 95% 93% 94%  Weight:      Height:       General exam: Alert, awake, oriented x 3; status post cholecystectomy, ladds procedure and umbilical hernia repair; reporting expected and appropriate abdominal discomfort.  No nausea or vomiting. Respiratory system: Clear to auscultation. Respiratory effort normal.  Good saturation on room air. Cardiovascular system:RRR. No murmurs, rubs, gallops.  Unable to assess JVD with body habitus. Gastrointestinal system: Abdomen is obese, tender to palpation as expected after surgery; decreased but present bowel sounds. Central nervous system: Alert and oriented. No focal neurological deficits. Extremities: No cyanosis or clubbing. Skin: No petechiae. Psychiatry: Judgement and insight appear normal. Mood & affect appropriate.    Data Reviewed: Comprehensive metabolic panel demonstrating sodium 138, potassium 3.3, BUN 13, creatinine 0.68, AST 151, ALT 557 alkaline phosphatase 146 and total bilirubin 1.5 CBC: Hemoglobin 13.3, WBC 8.4 and platelets count 201K Lipase 135   Family Communication: No family at bedside.  Disposition: Status is: Inpatient Remains inpatient appropriate because: Status post cholecystectomy and umbilical hernia repair; advancing diet and activity to rule out possibility of developing ileus.   Planned Discharge Destination: Home   Author: Barton Dubois, MD 12/26/2021 5:03 PM  For on call review [email or web address].

## 2021-12-26 NOTE — Anesthesia Postprocedure Evaluation (Signed)
Anesthesia Post Note  Patient: Education administrator  Procedure(s) Performed: LAPAROSCOPIC CHOLECYSTECTOMY (Abdomen) HERNIA REPAIR UMBILICAL ADULT (Abdomen) LAPAROSCOPIC LADD'S PROCEDURE (Abdomen)  Patient location during evaluation: Phase II Anesthesia Type: General Level of consciousness: awake Pain management: pain level controlled Vital Signs Assessment: post-procedure vital signs reviewed and stable Respiratory status: spontaneous breathing and respiratory function stable Cardiovascular status: blood pressure returned to baseline and stable Postop Assessment: no headache and no apparent nausea or vomiting Anesthetic complications: yes Comments: Late entry   Encounter Notable Events  Notable Event Outcome Phase Comment  Difficult to intubate - expected  Intraprocedure Filed from anesthesia note documentation.     Last Vitals:  Vitals:   12/26/21 1431 12/26/21 1444  BP: (!) 159/89 (!) 170/90  Pulse: 80 79  Resp: 14 16  Temp:  36.9 C  SpO2: 93% 94%    Last Pain:  Vitals:   12/26/21 1649  TempSrc:   PainSc: [place]

## 2021-12-26 NOTE — Transfer of Care (Signed)
Immediate Anesthesia Transfer of Care Note  Patient: Brandon Dominguez  Procedure(s) Performed: LAPAROSCOPIC CHOLECYSTECTOMY (Abdomen) HERNIA REPAIR UMBILICAL ADULT (Abdomen) LAPAROSCOPIC LADD'S PROCEDURE (Abdomen)  Patient Location: PACU  Anesthesia Type:General  Level of Consciousness: awake  Airway & Oxygen Therapy: Patient Spontanous Breathing and Patient connected to nasal cannula oxygen  Post-op Assessment: Report given to RN and Post -op Vital signs reviewed and stable  Post vital signs: Reviewed and stable  Last Vitals:  Vitals Value Taken Time  BP 145/82 12/26/21 1330  Temp    Pulse 90 12/26/21 1335  Resp 15 12/26/21 1335  SpO2 96 % 12/26/21 1335  Vitals shown include unvalidated device data.  Last Pain:  Vitals:   12/26/21 0802  TempSrc: Oral  PainSc: 0-No pain         Complications:  Encounter Notable Events  Notable Event Outcome Phase Comment  Difficult to intubate - expected  Intraprocedure Filed from anesthesia note documentation.

## 2021-12-26 NOTE — Anesthesia Preprocedure Evaluation (Signed)
Anesthesia Evaluation  Patient identified by MRN, date of birth, ID band Patient awake    Reviewed: Allergy & Precautions, H&P , NPO status , Patient's Chart, lab work & pertinent test results, reviewed documented beta blocker date and time   Airway Mallampati: II  TM Distance: >3 FB Neck ROM: full    Dental no notable dental hx.    Pulmonary asthma ,    Pulmonary exam normal breath sounds clear to auscultation       Cardiovascular Exercise Tolerance: Good negative cardio ROS   Rhythm:regular Rate:Normal     Neuro/Psych negative neurological ROS  negative psych ROS   GI/Hepatic Neg liver ROS, GERD  Medicated,  Endo/Other  diabetes, Type obesity  Renal/GU negative Renal ROS  negative genitourinary   Musculoskeletal   Abdominal   Peds  Hematology negative hematology ROS (+)   Anesthesia Other Findings   Reproductive/Obstetrics negative OB ROS                             Anesthesia Physical Anesthesia Plan  ASA: 3  Anesthesia Plan: General and General ETT   Post-op Pain Management:    Induction:   PONV Risk Score and Plan: Ondansetron  Airway Management Planned:   Additional Equipment:   Intra-op Plan:   Post-operative Plan:   Informed Consent: I have reviewed the patients History and Physical, chart, labs and discussed the procedure including the risks, benefits and alternatives for the proposed anesthesia with the patient or authorized representative who has indicated his/her understanding and acceptance.     Dental Advisory Given  Plan Discussed with: CRNA  Anesthesia Plan Comments:         Anesthesia Quick Evaluation

## 2021-12-27 ENCOUNTER — Inpatient Hospital Stay (HOSPITAL_COMMUNITY): Payer: BC Managed Care – PPO

## 2021-12-27 DIAGNOSIS — K851 Biliary acute pancreatitis without necrosis or infection: Secondary | ICD-10-CM | POA: Diagnosis not present

## 2021-12-27 DIAGNOSIS — K219 Gastro-esophageal reflux disease without esophagitis: Secondary | ICD-10-CM | POA: Diagnosis not present

## 2021-12-27 DIAGNOSIS — F988 Other specified behavioral and emotional disorders with onset usually occurring in childhood and adolescence: Secondary | ICD-10-CM | POA: Diagnosis not present

## 2021-12-27 DIAGNOSIS — E6609 Other obesity due to excess calories: Secondary | ICD-10-CM | POA: Diagnosis not present

## 2021-12-27 LAB — COMPREHENSIVE METABOLIC PANEL
ALT: 487 U/L — ABNORMAL HIGH (ref 0–44)
AST: 143 U/L — ABNORMAL HIGH (ref 15–41)
Albumin: 3.7 g/dL (ref 3.5–5.0)
Alkaline Phosphatase: 144 U/L — ABNORMAL HIGH (ref 38–126)
Anion gap: 12 (ref 5–15)
BUN: 13 mg/dL (ref 6–20)
CO2: 21 mmol/L — ABNORMAL LOW (ref 22–32)
Calcium: 8.3 mg/dL — ABNORMAL LOW (ref 8.9–10.3)
Chloride: 103 mmol/L (ref 98–111)
Creatinine, Ser: 0.89 mg/dL (ref 0.61–1.24)
GFR, Estimated: >60 mL/min (ref >60)
Glucose, Bld: 131 mg/dL — ABNORMAL HIGH (ref 70–99)
Potassium: 3.6 mmol/L (ref 3.5–5.1)
Sodium: 136 mmol/L (ref 135–145)
Total Bilirubin: 1.2 mg/dL (ref 0.3–1.2)
Total Protein: 7.2 g/dL (ref 6.5–8.1)

## 2021-12-27 LAB — CBC WITH DIFFERENTIAL/PLATELET
Abs Immature Granulocytes: 0.09 10*3/uL — ABNORMAL HIGH (ref 0.00–0.07)
Basophils Absolute: 0 10*3/uL (ref 0.0–0.1)
Basophils Relative: 0 %
Eosinophils Absolute: 0 10*3/uL (ref 0.0–0.5)
Eosinophils Relative: 0 %
HCT: 43.5 % (ref 39.0–52.0)
Hemoglobin: 14.3 g/dL (ref 13.0–17.0)
Immature Granulocytes: 1 %
Lymphocytes Relative: 13 %
Lymphs Abs: 1.7 10*3/uL (ref 0.7–4.0)
MCH: 31.1 pg (ref 26.0–34.0)
MCHC: 32.9 g/dL (ref 30.0–36.0)
MCV: 94.6 fL (ref 80.0–100.0)
Monocytes Absolute: 1.8 10*3/uL — ABNORMAL HIGH (ref 0.1–1.0)
Monocytes Relative: 14 %
Neutro Abs: 9.2 10*3/uL — ABNORMAL HIGH (ref 1.7–7.7)
Neutrophils Relative %: 72 %
Platelets: 226 10*3/uL (ref 150–400)
RBC: 4.6 MIL/uL (ref 4.22–5.81)
RDW: 13.4 % (ref 11.5–15.5)
WBC: 12.8 10*3/uL — ABNORMAL HIGH (ref 4.0–10.5)
nRBC: 0 % (ref 0.0–0.2)

## 2021-12-27 LAB — LIPASE, BLOOD: Lipase: 33 U/L (ref 11–51)

## 2021-12-27 LAB — GLUCOSE, CAPILLARY: Glucose-Capillary: 195 mg/dL — ABNORMAL HIGH (ref 70–99)

## 2021-12-27 IMAGING — DX DG ABDOMEN 1V
3 series · 3 of 3 positions shown · non-contrast
Comparison: Radiograph done on [DATE], CT done on [DATE]

CLINICAL DATA: Abdominal distention

EXAM:
ABDOMEN - 1 VIEW

[abdomen supine grid (1 of 3)]
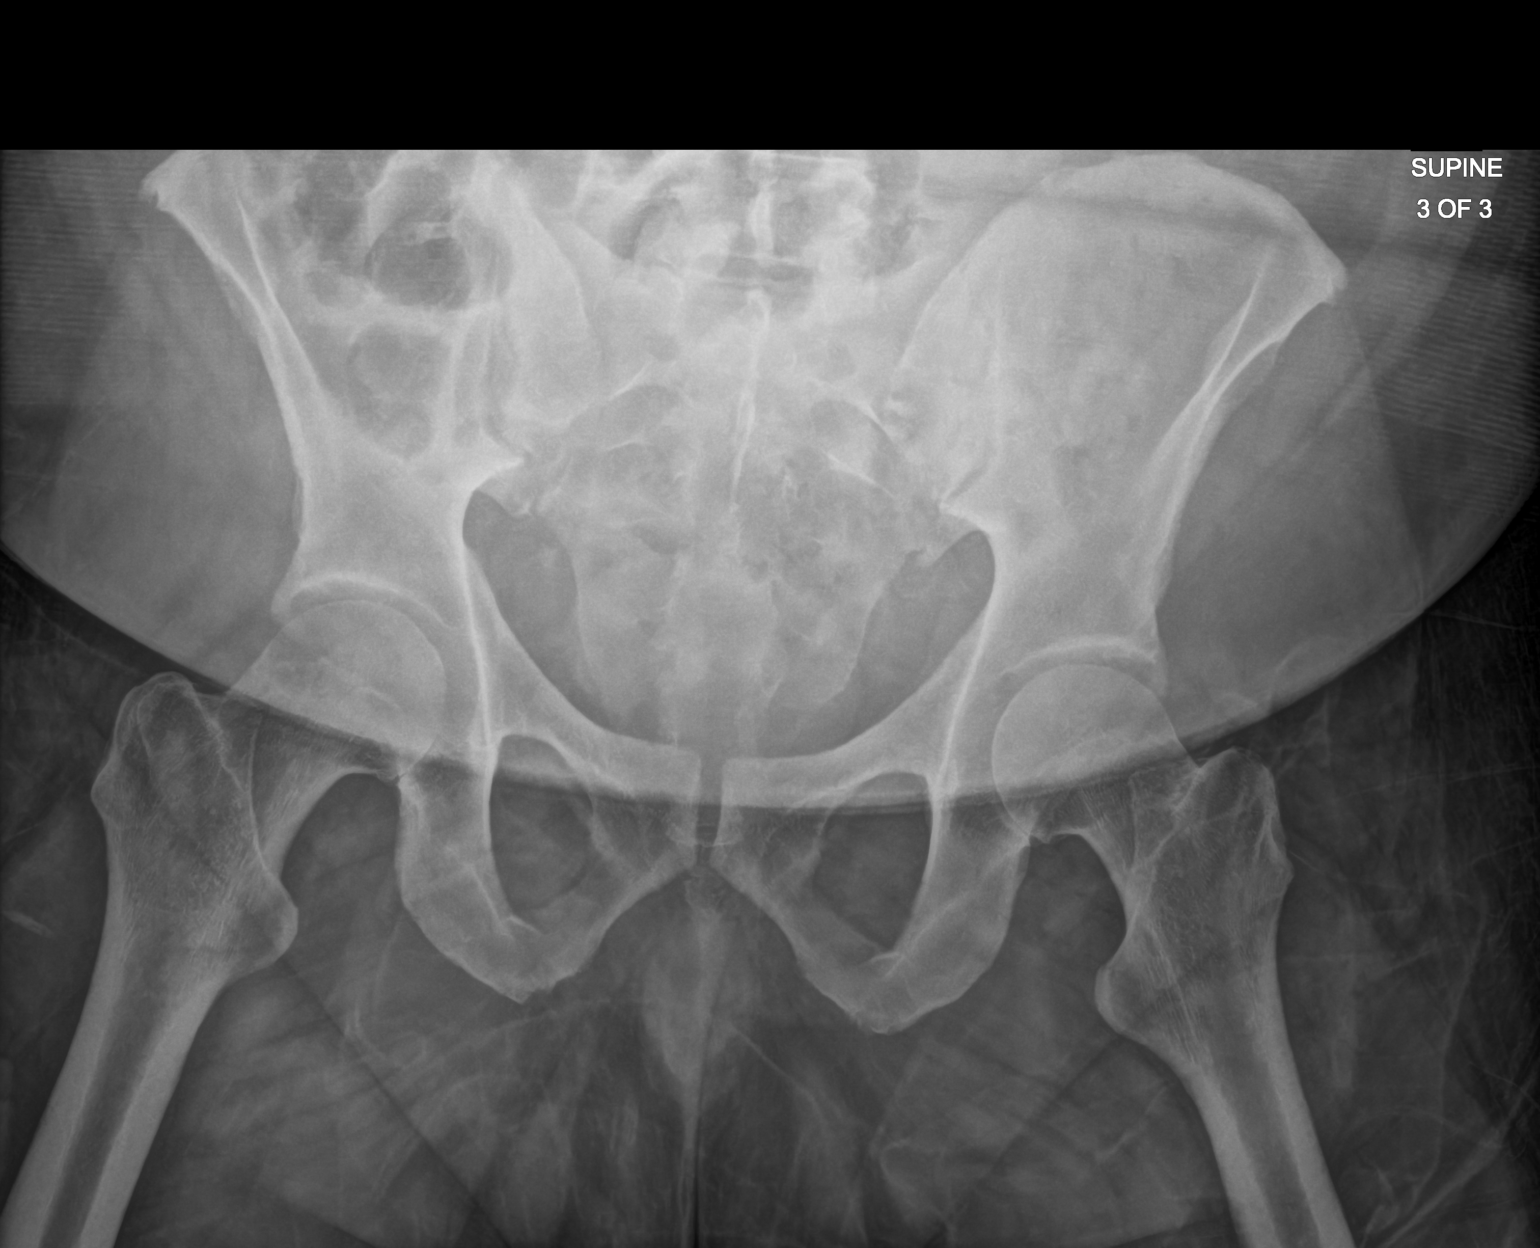

[abdomen supine grid (2 of 3)]
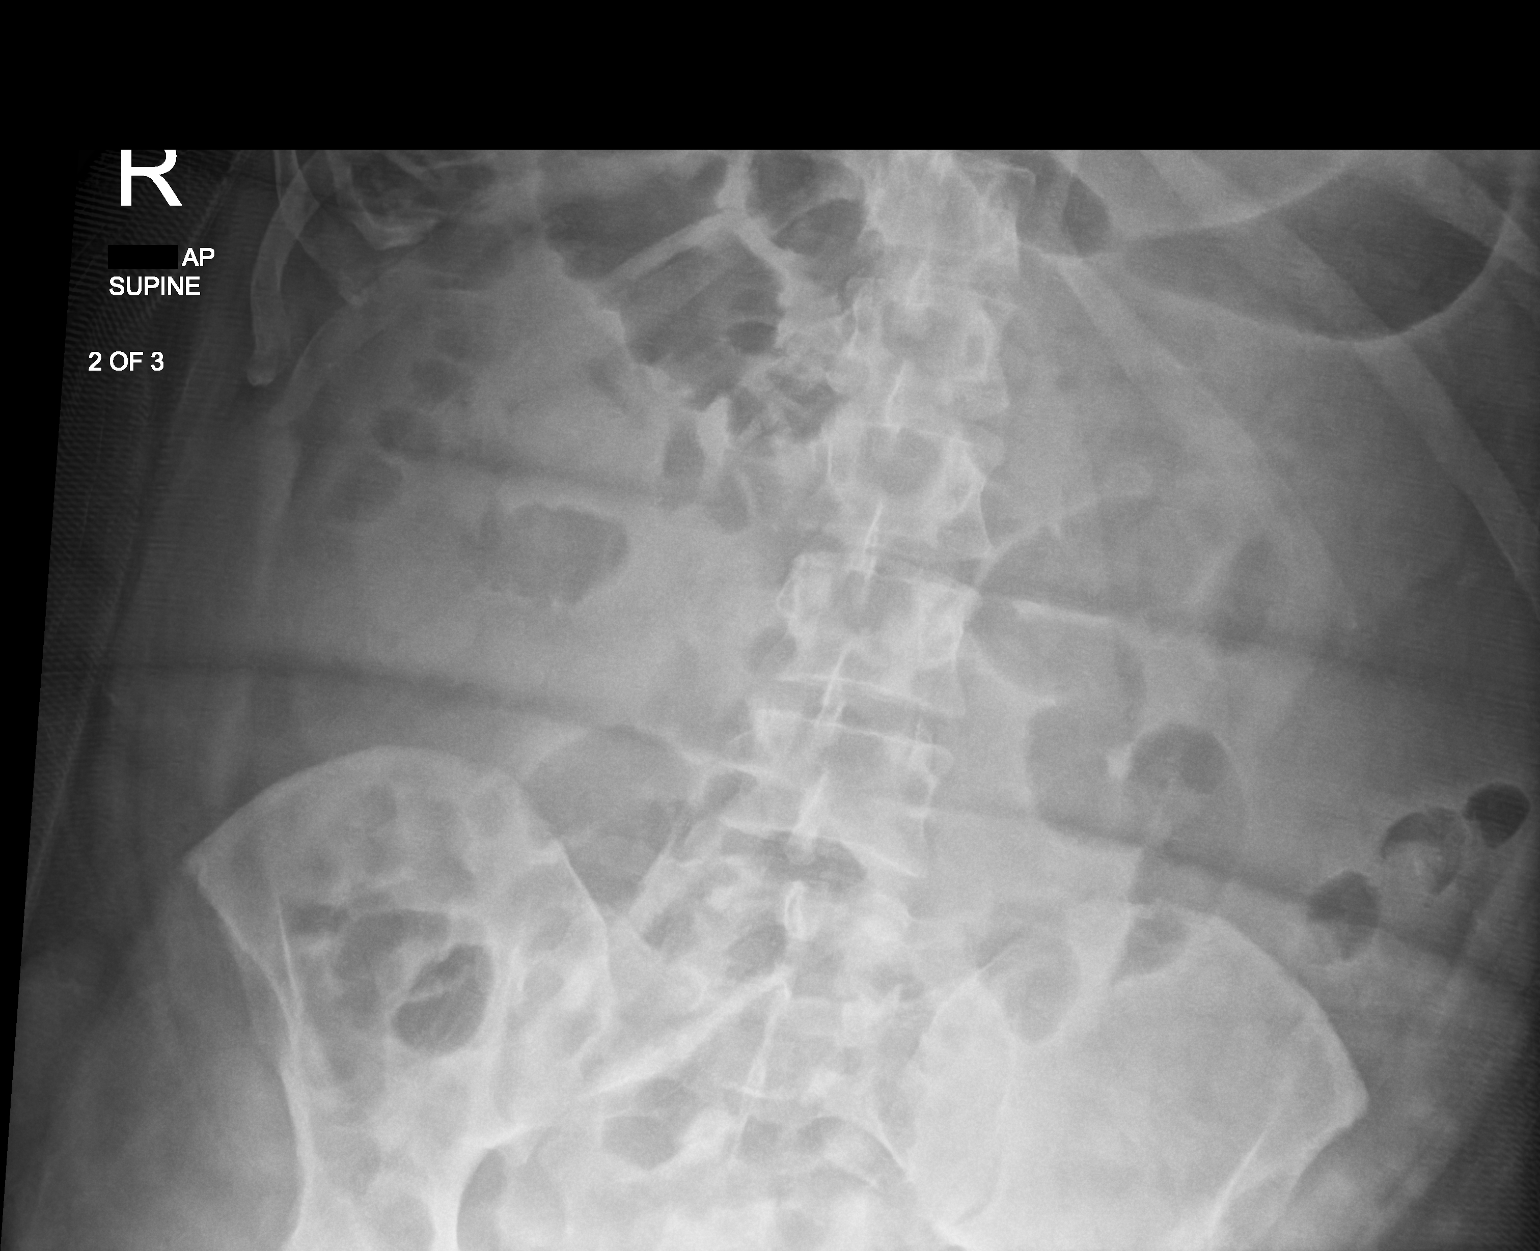

[abdomen supine grid (3 of 3)]
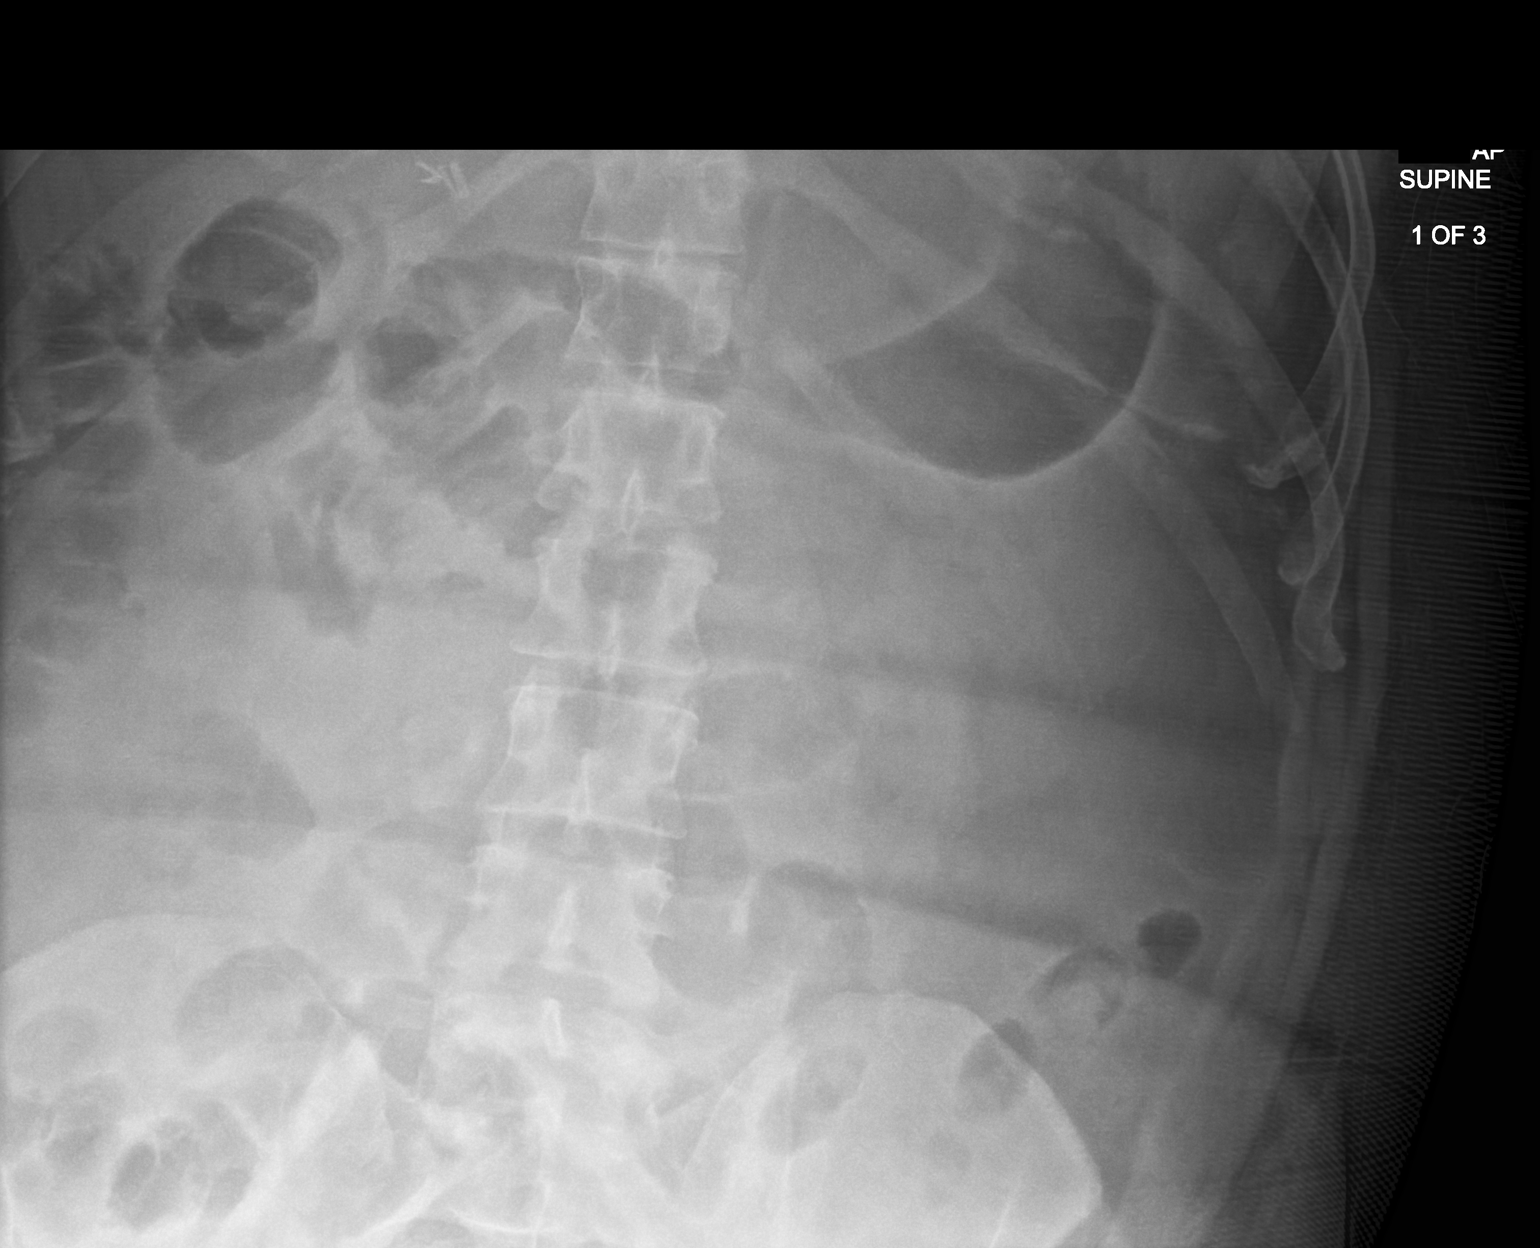

[3 of 3 positions shown; findings below may reference images not displayed]

FINDINGS: There is dilation of small-bowel loops measuring up to 4.5 cm. Gas
is present in colon. Stomach is not distended. Surgical clips are
seen in the gallbladder fossa.
IMPRESSION: There is dilation of small-bowel loops suggesting ileus or partial
small bowel obstruction.

## 2021-12-27 MED ORDER — INSULIN ASPART 100 UNIT/ML IJ SOLN: 0.0000 [IU] | Freq: Every day | INTRAMUSCULAR | Status: DC

## 2021-12-27 MED ORDER — ACETAMINOPHEN 325 MG PO TABS: 650.0000 mg | ORAL_TABLET | Freq: Four times a day (QID) | ORAL | Status: DC | PRN

## 2021-12-27 MED ORDER — INSULIN ASPART 100 UNIT/ML IJ SOLN
0.0000 [IU] | Freq: Three times a day (TID) | INTRAMUSCULAR | Status: DC
  Administered 2021-12-28 – 2021-12-29 (×2): 2 [IU] via SUBCUTANEOUS

## 2021-12-27 MED ORDER — KETOROLAC TROMETHAMINE 15 MG/ML IJ SOLN
15.0000 mg | Freq: Once | INTRAMUSCULAR | Status: AC
  Administered 2021-12-27: 15 mg via INTRAVENOUS
  Filled 2021-12-27: qty 1

## 2021-12-27 MED ORDER — ALUM & MAG HYDROXIDE-SIMETH 200-200-20 MG/5ML PO SUSP
15.0000 mL | ORAL | Status: DC | PRN
Start: 2021-12-27 — End: 2021-12-30
  Administered 2021-12-27 – 2021-12-29 (×3): 15 mL via ORAL
  Filled 2021-12-27 (×3): qty 30

## 2021-12-27 NOTE — Progress Notes (Signed)
Progress Note   Patient: Brandon Dominguez JXB:[id number] DOB: 12/24/1985 DOA: 12/25/2021     2 DOS: the patient was seen and examined on 12/27/2021   Brief hospital course: Brandon Dominguez is a 36 y.o. male with medical history significant of class II obesity, gastroesophageal flux disease, ADHD, history of diverticulosis, hepatic steatosis and prediabetes; who presented to the hospital secondary to abdominal pain, nausea/vomiting.  Symptoms has been present for the last 4-5 days and worsening; in the last 24 hours had difficulty keeping things down and reports discomfort worsen with oral intake.  He has not noticed any hematemesis, patient's pain mainly located in the epigastric area to mid abdomen, reports to be intermittent, radiates to lower aspect of his chest region and back, 8-9 out of 10 in intensity; relieved in 2 occasions after vomiting and worsening with oral intake.  Patient reports no fever, no sick contacts, no diarrhea, no melena, no hematochezia, no dysuria, no hematuria, no focal weaknesses or any other complaints.   In the ED abdominal ultrasound failed to demonstrate any gallstones but reveal mild dilatation of CBD.  Patient blood work remarkable for significant transaminitis and elevated lipase.  MRCP demonstrated gallstone pancreatitis and no choledocholithiasis.   TRH has been called to place patient in the hospital for further evaluation and management.   Assessment and Plan: * Acute pancreatitis - Without significant history of alcohol abuse or significant derangement appreciated on lipid panel. -MRCP demonstrating biliary pancreatitis origin. -No obstructing stone appreciated. -Status post laparoscopic cholecystectomy, Ladds procedure and umbilical hernia repair on 12/26/2021. -LFTs significantly improved. -Continue supportive care, as needed analgesics and slowly advance diet as per recommendations by general surgery. -Continue postoperative care. -No nausea or vomiting. -Lipase  within normal limits.  Incarcerated umbilical hernia - Status post surgical repair. -Follow-up postoperative care per general surgery.  Transaminitis -In the setting of biliary pancreatitis and hepatic steatosis -Hepatitis panel negative for acute infection. -MRCP demonstrating fatty liver changes and no other structural abnormalities. -Continue patient follow-up with GI service as an outpatient. -Lifestyle changes, modified carbohydrate and low-fat diet recommended at time of discharge. -LFTs continue trending down appropriately.  GERD (gastroesophageal reflux disease) -Continue PPI.  Class 2 obesity due to excess calories in adult - Low calorie diet, portion control and increase physical activity discussed with patient. -Body mass index is 37.66 kg/m.  Prediabetes - A1c 7.3 -Patient expressed instructed to take metformin in the past; he has been not taking medications and just following diet control and lifestyle changes. -Continue sliding scale insulin while inpatient -Will recommend the use of metformin (starting at 500 mg twice a day using extended release form) along with continued lifestyle changes and modified carbohydrate diet at discharge.  Attention deficit disorder (ADD) without hyperactivity -Continue holding Concerta while inpatient and barely tolerating orals.. -This medication has also been noted to cause transaminitis. -As long as LFTs levels continue to improve/resolved this medication can be resumed at discharge.  Malrotation, congenital -Patient with congenital intestinal malrotation; with component of umbilical hernia which appears to be currently incarcerated. -Umbilical hernia has been repaired. -Status post surgical intervention with removal of appendix and cecum; postoperative care per general surgery service.  Postoperative ileus -KUB demonstrating signs of ileus -Patient denies bowel movements or ability to pass gas. -Increase physical activity,  continue clear liquid diet and as needed analgesics -Follow general surgery recommendation.   Subjective:  No fever, no chest pain, reports no nausea vomiting.  Appropriately tender to palpation after surgical intervention.  Reports  burping, no flatus, no bowel movements.  Physical Exam: Vitals:   12/26/21 1431 12/26/21 1444 12/26/21 1840 12/26/21 2301  BP: (!) 159/89 (!) 170/90 (!) 154/79 129/81  Pulse: 80 79 85 84  Resp: 14 16 18    Temp:  98.5 F (36.9 C) 98.7 F (37.1 C) 98.6 F (37 C)  TempSrc:   Oral Oral  SpO2: 93% 94% 93% 94%  Weight:      Height:       General exam: Alert, awake, oriented x 3; no chest pain, no nausea, no vomiting.  Reports increased burping and at this moment no passing gas or bowel movements. Respiratory system: Clear to auscultation. Respiratory effort normal.  Good oxygen saturation on room air.  No using accessory muscle. Cardiovascular system:RRR. No murmurs, rubs, gallops. unable to properly assess JVD due to body habitus. Gastroin on testinal system: Abdomen is obese, appropriately tender to palpation import site; mild distention appreciated.  No guarding.  Decreased breath sounds.  Binder in place.   Central nervous system: Alert and oriented. No focal neurological deficits. Extremities: No cyanosis or clubbing. Skin: No petechiae. Psychiatry: Judgement and insight appear normal. Mood & affect appropriate.   Data Reviewed: CBC demonstrating WBCs of 12.8, hemoglobin 14.3 and platelets count 226 K Lipase 33 Complete metabolic panel demonstrating sodium 136, potassium 3.6, BUN 13, creatinine 0.89, AST 143, ALT 487, alkaline phosphatase 144 and bilirubin 1.2    Family Communication: No family at bedside.  Disposition: Status is: Inpatient Remains inpatient appropriate because: Status post cholecystectomy and umbilical hernia repair; advancing diet and activity to rule out possibility of developing ileus.   Planned Discharge Destination:  Home   Author: , MD 12/27/2021 2:38 PM  For on call review [email or web address].

## 2021-12-27 NOTE — Progress Notes (Signed)
[facility]  Burping reported. Kub with signs of ileus. Not unexpected given the pancreatitis and the manipulation of the bowel.  BP 129/81   Pulse 84   Temp 98.6 F (37 C) (Oral)   Resp 18   Ht 5\' 11"  (1.803 m)   Wt 122.5 kg   SpO2 94%   BMI 37.66 kg/m  Soft, minimally distended, port sites c/d/I with dermabond Binder replaced  Patient s/p lap chole, ladd's band procedure, and umbilical hernia repair.  PRN for pain IS, OOB Clear  diet Awaiting bowel function Ambulate Pancreatitis improved, NS @ 50 from 175  SCDs, heparin sq    Will need to be on diet and having Bm before dc home. Discussed with patient and family and RN.   , MD [facility] [address] [place], [place] [phone] (office)

## 2021-12-27 NOTE — Progress Notes (Signed)
Subjective:  Patient said he has been sipping on clear liquids.  According nursing staff he was up all night complaining of pain and spasm in going the bathroom to urinate.  Current Medications:  Current Facility-Administered Medications:    0.9 %  sodium chloride infusion, , Intravenous, Continuous, Virl Cagey, MD, Last Rate: 50 mL/hr at 12/27/21 1217, Rate Change at 12/27/21 1217   heparin injection 5,000 Units, 5,000 Units, Subcutaneous, Q8H, Bridges, Lanell Matar, MD   HYDROmorphone (DILAUDID) injection 1 mg, 1 mg, Intravenous, Q3H PRN, Virl Cagey, MD, 1 mg at 12/27/21 1217   ondansetron (ZOFRAN) injection 4 mg, 4 mg, Intravenous, Q6H PRN, Virl Cagey, MD, 4 mg at 12/27/21 1217   oxyCODONE (Oxy IR/ROXICODONE) immediate release tablet 5-10 mg, 5-10 mg, Oral, Q4H PRN, Virl Cagey, MD, 10 mg at 12/27/21 0842   pantoprazole (PROTONIX) injection 40 mg, 40 mg, Intravenous, Daily, Virl Cagey, MD, 40 mg at 12/27/21 0843   tiZANidine (ZANAFLEX) tablet 2 mg, 2 mg, Oral, Q8H PRN, Adefeso, Oladapo, DO, 2 mg at 12/26/21 2234   Objective: Blood pressure 129/81, pulse 84, temperature 98.6 F (37 C), temperature source Oral, resp. rate 18, height '5\' 11"'  (1.803 m), weight 122.5 kg, SpO2 94 %. Patient is resting. He has abdominal binder in place.  I did not attempt to undo this.  Labs/studies Results:      Latest Ref Rng & Units 12/27/2021    5:37 AM 12/26/2021    4:17 AM 12/25/2021    3:42 AM  CBC  WBC 4.0 - 10.5 K/uL 12.8  8.4  12.8   Hemoglobin 13.0 - 17.0 g/dL 14.3  13.3  15.7   Hematocrit 39.0 - 52.0 % 43.5  40.0  46.7   Platelets 150 - 400 K/uL 226  201  286        Latest Ref Rng & Units 12/27/2021    5:54 AM 12/26/2021    4:17 AM 12/25/2021    6:34 AM  CMP  Glucose 70 - 99 mg/dL 131  132    BUN 6 - 20 mg/dL 13  13    Creatinine 0.61 - 1.24 mg/dL 0.89  0.68    Sodium 135 - 145 mmol/L 136  138    Potassium 3.5 - 5.1 mmol/L 3.6  3.3    Chloride 98 -  111 mmol/L 103  109    CO2 22 - 32 mmol/L 21  23    Calcium 8.9 - 10.3 mg/dL 8.3  8.0    Total Protein 6.5 - 8.1 g/dL 7.2  5.9  7.6   Total Bilirubin 0.3 - 1.2 mg/dL 1.2  1.5  4.6   Alkaline Phos 38 - 126 U/L 144  146  196   AST 15 - 41 U/L 143  151  448   ALT 0 - 44 U/L 487  557  979        Latest Ref Rng & Units 12/27/2021    5:54 AM 12/26/2021    4:17 AM 12/25/2021    6:34 AM  Hepatic Function  Total Protein 6.5 - 8.1 g/dL 7.2  5.9  7.6   Albumin 3.5 - 5.0 g/dL 3.7  3.1  4.0   AST 15 - 41 U/L 143  151  448   ALT 0 - 44 U/L 487  557  979   Alk Phosphatase 38 - 126 U/L 144  146  196   Total Bilirubin 0.3 - 1.2 mg/dL 1.2  1.5  4.6   Bilirubin, Direct 0.0 - 0.2 mg/dL   2.9     Abdominal film reviewed.  Gas-filled small bowel with dilation of at least 1 loop which measured 45 mm in diameter.  Assessment:  Elevated bilirubin alkaline phosphatase and transaminases secondary to biliary pancreatitis.  Work-up negative for choledocholithiasis.  Therefore he passed stone spontaneously.    He underwent laparoscopic cholecystectomy along with hernia repair and Ladd's procedure by Dr. Constance Haw yesterday. Significant improvement in bilirubin and transaminases in the last 24 hours.  Alkaline phosphatase is also trending down.  He denies developed ileus.  Recommendations  Continue monitor monitor LFTs until they are back to normal. Will sign off.

## 2021-12-27 NOTE — Progress Notes (Signed)
Patient wanted to wait awhile on pain meds as he wants to sit up and potentially walk today. He is complaining of continued aches and pains, but mostly gas. Spoke with his wife at bedside about re-evaluating patient at dinner to try and get him up out of bed again. He sat up after lunch for around an hour and a half; but complained of feeling dizzy. Vitals stable with low grade fever of 99.4 orally. Surgical sites dry and well approximated.

## 2021-12-27 NOTE — Progress Notes (Signed)
Patient ambulated to bathroom with assistance multiple times throughout the night. Patient did c/o pain and muscle spasms in abd throughout the night, pain level and spasms were controlled with PRN meds. Relief was achieved. Patient resting comfortably, sister is at bed side.

## 2021-12-27 NOTE — Progress Notes (Signed)
Educated patient on heparin, administered.

## 2021-12-27 NOTE — Progress Notes (Addendum)
Patient Called this nurse in requesting something to aid his bowel to move, states that Miralax cause him to have bad cramping. While listening to patient this nurse noticed that the patient was even more pale in color and seemed mor lethargic than how the patient was during report. Vitals where obtained: Temp 100.6 BP 149/87, Pulse 142, O2 91, respirations 18. Placed patient on nasal canula 2 liters to prove Oxygen levels. Notified Dr. Thomes Dinning. New orders obtained and informed by MD that he will come to see the patient. Will continue to monitor patient.

## 2021-12-28 ENCOUNTER — Inpatient Hospital Stay (HOSPITAL_COMMUNITY): Payer: BC Managed Care – PPO

## 2021-12-28 DIAGNOSIS — F988 Other specified behavioral and emotional disorders with onset usually occurring in childhood and adolescence: Secondary | ICD-10-CM | POA: Diagnosis not present

## 2021-12-28 DIAGNOSIS — E6609 Other obesity due to excess calories: Secondary | ICD-10-CM | POA: Diagnosis not present

## 2021-12-28 DIAGNOSIS — K219 Gastro-esophageal reflux disease without esophagitis: Secondary | ICD-10-CM | POA: Diagnosis not present

## 2021-12-28 DIAGNOSIS — K851 Biliary acute pancreatitis without necrosis or infection: Secondary | ICD-10-CM | POA: Diagnosis not present

## 2021-12-28 LAB — CBC WITH DIFFERENTIAL/PLATELET
Abs Immature Granulocytes: 0.11 10*3/uL — ABNORMAL HIGH (ref 0.00–0.07)
Basophils Absolute: 0.1 10*3/uL (ref 0.0–0.1)
Basophils Relative: 0 %
Eosinophils Absolute: 0.2 10*3/uL (ref 0.0–0.5)
Eosinophils Relative: 2 %
HCT: 34.9 % — ABNORMAL LOW (ref 39.0–52.0)
Hemoglobin: 11.8 g/dL — ABNORMAL LOW (ref 13.0–17.0)
Immature Granulocytes: 1 %
Lymphocytes Relative: 14 %
Lymphs Abs: 1.6 10*3/uL (ref 0.7–4.0)
MCH: 31.4 pg (ref 26.0–34.0)
MCHC: 33.8 g/dL (ref 30.0–36.0)
MCV: 92.8 fL (ref 80.0–100.0)
Monocytes Absolute: 1.7 10*3/uL — ABNORMAL HIGH (ref 0.1–1.0)
Monocytes Relative: 14 %
Neutro Abs: 8.1 10*3/uL — ABNORMAL HIGH (ref 1.7–7.7)
Neutrophils Relative %: 69 %
Platelets: 195 10*3/uL (ref 150–400)
RBC: 3.76 MIL/uL — ABNORMAL LOW (ref 4.22–5.81)
RDW: 13.2 % (ref 11.5–15.5)
WBC: 11.7 10*3/uL — ABNORMAL HIGH (ref 4.0–10.5)
nRBC: 0 % (ref 0.0–0.2)

## 2021-12-28 LAB — GLUCOSE, CAPILLARY
Glucose-Capillary: 127 mg/dL — ABNORMAL HIGH (ref 70–99)
Glucose-Capillary: 116 mg/dL — ABNORMAL HIGH (ref 70–99)
Glucose-Capillary: 104 mg/dL — ABNORMAL HIGH (ref 70–99)
Glucose-Capillary: 128 mg/dL — ABNORMAL HIGH (ref 70–99)

## 2021-12-28 LAB — COMPREHENSIVE METABOLIC PANEL
ALT: 259 U/L — ABNORMAL HIGH (ref 0–44)
AST: 61 U/L — ABNORMAL HIGH (ref 15–41)
Albumin: 3 g/dL — ABNORMAL LOW (ref 3.5–5.0)
Alkaline Phosphatase: 101 U/L (ref 38–126)
Anion gap: 8 (ref 5–15)
BUN: 9 mg/dL (ref 6–20)
CO2: 25 mmol/L (ref 22–32)
Calcium: 7.9 mg/dL — ABNORMAL LOW (ref 8.9–10.3)
Chloride: 102 mmol/L (ref 98–111)
Creatinine, Ser: 0.76 mg/dL (ref 0.61–1.24)
GFR, Estimated: >60 mL/min (ref >60)
Glucose, Bld: 135 mg/dL — ABNORMAL HIGH (ref 70–99)
Potassium: 3.3 mmol/L — ABNORMAL LOW (ref 3.5–5.1)
Sodium: 135 mmol/L (ref 135–145)
Total Bilirubin: 1.3 mg/dL — ABNORMAL HIGH (ref 0.3–1.2)
Total Protein: 6.1 g/dL — ABNORMAL LOW (ref 6.5–8.1)

## 2021-12-28 IMAGING — DX DG ABDOMEN 1V
1 series · 1 of 1 positions shown · non-contrast
Comparison: [DATE].

CLINICAL DATA: Ileus.  History of diabetes and asthma.

EXAM:
ABDOMEN - 1 VIEW

[abdomen supine]
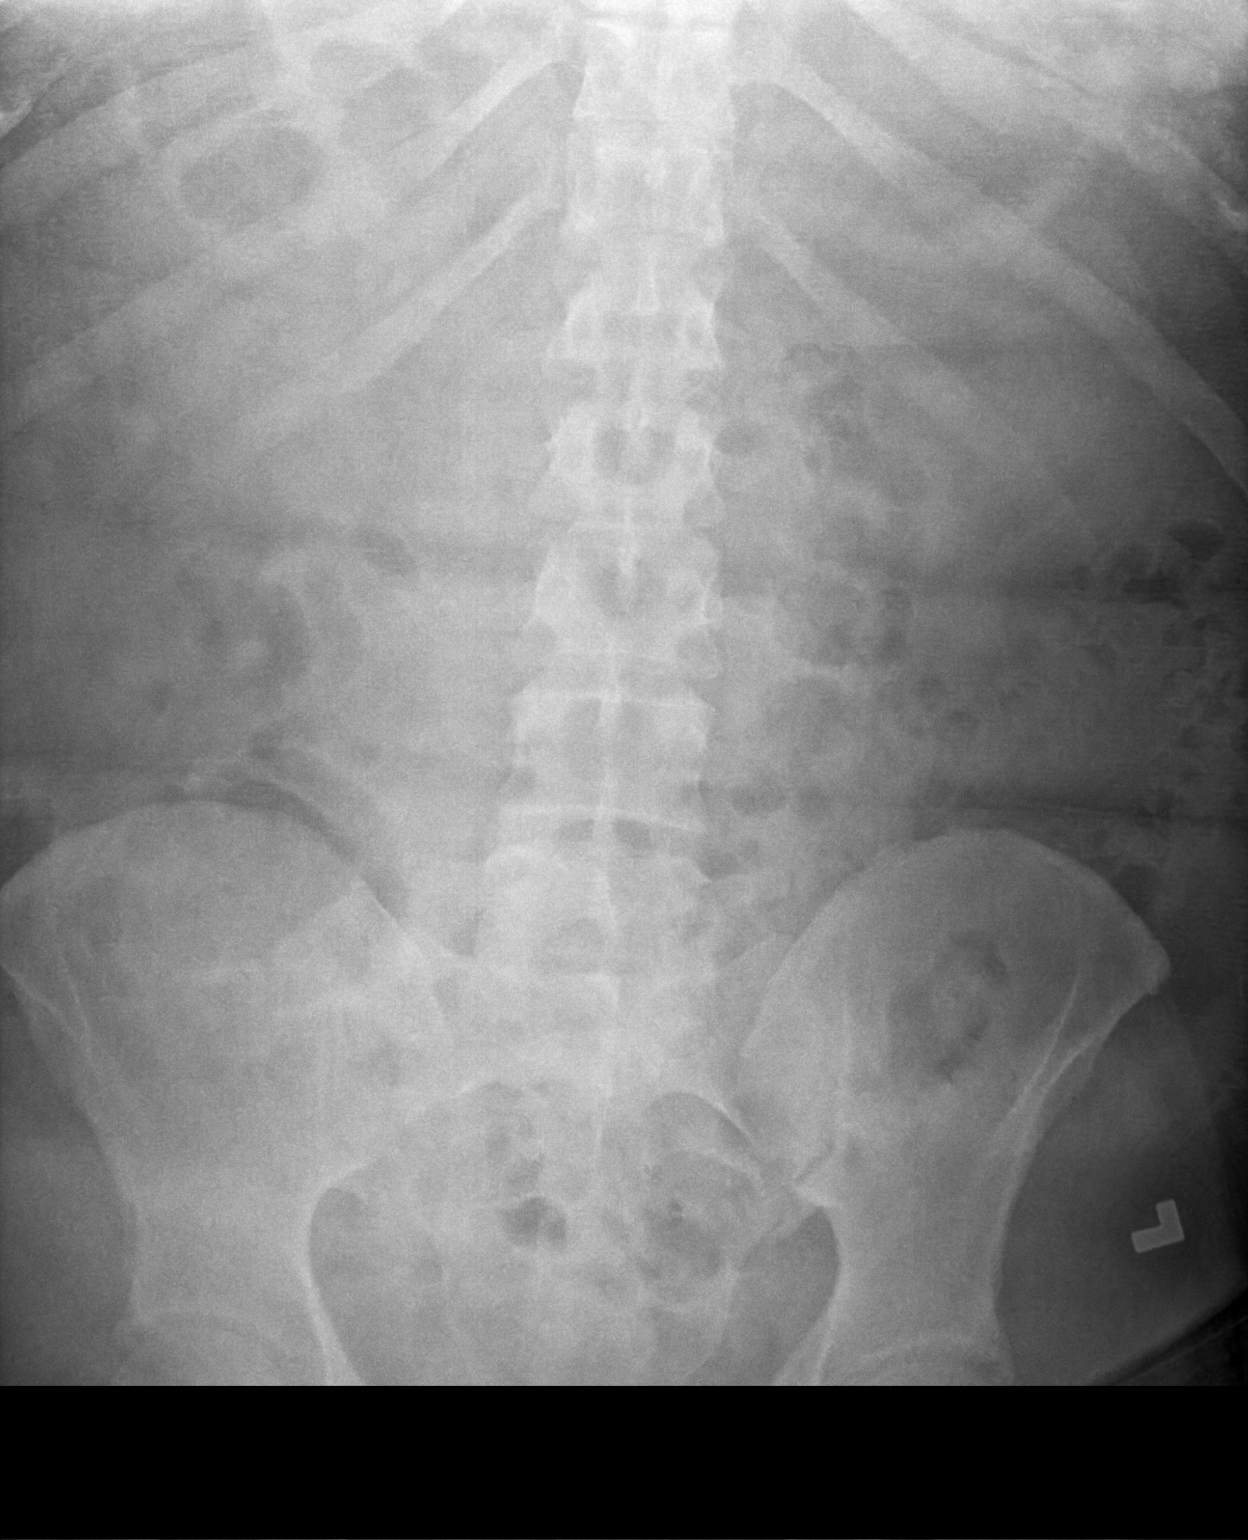

[1 of 1 positions shown; findings below may reference images not displayed]

FINDINGS: Normal bowel gas pattern. No evidence of obstruction or significant
generalized adynamic ileus.

No evidence of renal or ureteral stones. Soft tissues are
unremarkable.

No significant skeletal abnormality.
IMPRESSION: Negative.

## 2021-12-28 MED ORDER — SIMETHICONE 80 MG PO CHEW
80.0000 mg | CHEWABLE_TABLET | Freq: Four times a day (QID) | ORAL | Status: DC
Start: 2021-12-28 — End: 2021-12-30
  Administered 2021-12-28 – 2021-12-30 (×7): 80 mg via ORAL
  Filled 2021-12-28 (×7): qty 1

## 2021-12-28 MED ORDER — ENOXAPARIN SODIUM 40 MG/0.4ML IJ SOSY
40.0000 mg | PREFILLED_SYRINGE | INTRAMUSCULAR | Status: DC
  Administered 2021-12-29: 40 mg via SUBCUTANEOUS
  Filled 2021-12-28 (×2): qty 0.4

## 2021-12-28 MED ORDER — HYDROMORPHONE HCL 1 MG/ML IJ SOLN: 1.0000 mg | INTRAMUSCULAR | Status: DC | PRN

## 2021-12-28 MED ORDER — PANTOPRAZOLE SODIUM 40 MG IV SOLR
40.0000 mg | Freq: Once | INTRAVENOUS | Status: AC
  Administered 2021-12-28: 40 mg via INTRAVENOUS
  Filled 2021-12-28: qty 10

## 2021-12-28 MED ORDER — KETOROLAC TROMETHAMINE 15 MG/ML IJ SOLN: 15.0000 mg | Freq: Three times a day (TID) | INTRAMUSCULAR | Status: DC | PRN

## 2021-12-28 MED ORDER — KETOROLAC TROMETHAMINE 15 MG/ML IJ SOLN
15.0000 mg | Freq: Four times a day (QID) | INTRAMUSCULAR | Status: DC | PRN
  Administered 2021-12-28 (×2): 15 mg via INTRAVENOUS
  Filled 2021-12-28 (×2): qty 1

## 2021-12-28 MED ORDER — POTASSIUM CHLORIDE 2 MEQ/ML IV SOLN: INTRAVENOUS | Status: DC

## 2021-12-28 MED ORDER — OXYCODONE HCL 5 MG PO TABS: 5.0000 mg | ORAL_TABLET | ORAL | Status: DC | PRN

## 2021-12-28 MED ORDER — ACETAMINOPHEN 325 MG PO TABS: 650.0000 mg | ORAL_TABLET | Freq: Four times a day (QID) | ORAL | Status: DC | PRN

## 2021-12-28 MED ORDER — POTASSIUM CHLORIDE IN NACL 40-0.9 MEQ/L-% IV SOLN: INTRAVENOUS | Status: DC

## 2021-12-28 MED ORDER — DOCUSATE SODIUM 100 MG PO CAPS
100.0000 mg | ORAL_CAPSULE | Freq: Two times a day (BID) | ORAL | Status: DC
  Administered 2021-12-28 – 2021-12-29 (×3): 100 mg via ORAL
  Filled 2021-12-28 (×4): qty 1

## 2021-12-28 MED ORDER — IBUPROFEN 800 MG PO TABS
800.0000 mg | ORAL_TABLET | Freq: Four times a day (QID) | ORAL | Status: DC | PRN
  Administered 2021-12-28 – 2021-12-29 (×2): 800 mg via ORAL
  Filled 2021-12-28 (×2): qty 1

## 2021-12-28 NOTE — Progress Notes (Signed)
[facility] Progress Note  2 Days Post-Op  Subjective: Did have some fever and tachycardia last night, blood cultures obtained. IS ordered for him. KUB reassuring today. Leukocytosis down, neutrophils down.   Is worried about drinking the clear liquid that have sugar and not taking in much. Having some burping but less, having some flatus, no BM yet. Did have reflux last night and protonix ordered.    Objective: Vital signs in last 24 hours: Temp:  [98.1 F (36.7 C)-100.6 F (38.1 C)] 99.1 F (37.3 C) (06/11 0807) Pulse Rate:  [91-142] 104 (06/11 0807) Resp:  [18] 18 (06/11 0807) BP: (114-149)/(59-87) 118/63 (06/11 0807) SpO2:  [91 %-99 %] 98 % (06/11 1400)    Intake/Output from previous day: 06/10 0701 - 06/11 0700 In: 600 [P.O.:600] Out: -  Intake/Output this shift: Total I/O In: 960 [P.O.:960] Out: -   General appearance: alert and no distress Resp: normal work of breathing GI: soft, minimally distended, port sites c/d/I with dermabond, umbilical incision c/d/I with dermabond, appropriately tender, no rebound or guarding   Lab Results:  Recent Labs    12/27/21 0537 12/28/21 0811  WBC 12.8* 11.7*  HGB 14.3 11.8*  HCT 43.5 34.9*  PLT 226 195   BMET Recent Labs    12/27/21 0554 12/28/21 0437  NA 136 135  K 3.6 3.3*  CL 103 102  CO2 21* 25  GLUCOSE 131* 135*  BUN 13 9  CREATININE 0.89 0.76  CALCIUM 8.3* 7.9*   PT/INR No results for input(s): "LABPROT", "INR" in the last 72 hours.  Studies/Results: DG Abd 1 View  Result Date: 12/28/2021 CLINICAL DATA:  Ileus.  History of diabetes and asthma. EXAM: ABDOMEN - 1 VIEW COMPARISON:  12/27/2021. FINDINGS: Normal bowel gas pattern. No evidence of obstruction or significant generalized adynamic ileus. No evidence of renal or ureteral stones. Soft tissues are unremarkable. No significant skeletal abnormality. IMPRESSION: Negative. Electronically Signed   By: Amie Portland M.D.   On: 12/28/2021  11:24   DG Abd 1 View  Result Date: 12/27/2021 CLINICAL DATA:  Abdominal distention EXAM: ABDOMEN - 1 VIEW COMPARISON:  Radiograph done on 05/03/2017, CT done on 12/25/2021 FINDINGS: There is dilation of small-bowel loops measuring up to 4.5 cm. Gas is present in colon. Stomach is not distended. Surgical clips are seen in the gallbladder fossa. IMPRESSION: There is dilation of small-bowel loops suggesting ileus or partial small bowel obstruction. Electronically Signed   By: Ernie Avena M.D.   On: 12/27/2021 11:39    Anti-infectives: Anti-infectives (From admission, onward)    Start     Dose/Rate Route Frequency Ordered Stop   12/26/21 0600  cefoTEtan (CEFOTAN) 2 g in sodium chloride 0.9 % 100 mL IVPB        2 g 200 mL/hr over 30 Minutes Intravenous On call to O.R. 12/25/21 1636 12/26/21 7530       Assessment/Plan: Patient s/p laparoscopic cholecystectomy ladd's procedure and umbilical hernia repair with gallstone pancreatitis, malrotation and umbilical hernia with incarcerated omentum. Doing fair but he did have some tachycardia and fever last night. Discussed that leukocytosis is better, KUB is better, had having flatus, discussed that we will continue to monitor and that likely it was form atelectasis, encouraged IS and ambulating.   Added tylenol and ibuprofen PRN nacrotics Is, OOB Ambulating Diabetic diet, simethicone added QID Colace added BID IVF off Labs tomorrow, H&H drifting but has received a lot of fluids  SCDs, changed to lovenox from heparin sq  LOS: 3 days    Lucretia Roers 12/28/2021

## 2021-12-28 NOTE — Progress Notes (Signed)
Primary nurse requested charge RN to bedside.  Patient had a red MEWS score and seemed different according to primary staff.  Vital signs were done, bp wnl, although patient was tachycardic and increased temperature. Patient was alert and responsive, slightly lethargic.  Oxygen on at 2 liters, increased to 3 liters.  Advised staff to given tylenol and alert MD on call.  Also advised to monitor abdominal area  and assess appropriately due to recent abdominal surgery.

## 2021-12-28 NOTE — Progress Notes (Signed)
Progress Note   Patient: Brandon Dominguez KGU:[id number] DOB: [birth year] DOA: 12/25/2021     3 DOS: the patient was seen and examined on 12/28/2021   Brief hospital course: Darelle Kings is a 36 y.o. male with medical history significant of class II obesity, gastroesophageal flux disease, ADHD, history of diverticulosis, hepatic steatosis and prediabetes; who presented to the hospital secondary to abdominal pain, nausea/vomiting.  Symptoms has been present for the last 4-5 days and worsening; in the last 24 hours had difficulty keeping things down and reports discomfort worsen with oral intake.  He has not noticed any hematemesis, patient's pain mainly located in the epigastric area to mid abdomen, reports to be intermittent, radiates to lower aspect of his chest region and back, 8-9 out of 10 in intensity; relieved in 2 occasions after vomiting and worsening with oral intake.  Patient reports no fever, no sick contacts, no diarrhea, no melena, no hematochezia, no dysuria, no hematuria, no focal weaknesses or any other complaints.   In the ED abdominal ultrasound failed to demonstrate any gallstones but reveal mild dilatation of CBD.  Patient blood work remarkable for significant transaminitis and elevated lipase.  MRCP demonstrated gallstone pancreatitis and no choledocholithiasis.   TRH has been called to place patient in the hospital for further evaluation and management.   Assessment and Plan: * Acute pancreatitis - Without significant history of alcohol abuse or significant derangement appreciated on lipid panel. -MRCP demonstrating biliary pancreatitis origin. -No obstructing stone appreciated. -Status post laparoscopic cholecystectomy, Ladds procedure and umbilical hernia repair on 12/26/2021. -LFTs continue to trend down and has demonstrated significant improvement.   -Continue supportive care, as needed analgesics and slowly advance diet as per recommendations by general surgery. -Continue  postoperative care. -No nausea or vomiting. -Lipase within normal limits.  Incarcerated umbilical hernia -Status post surgical repair. -Follow-up postoperative care per general surgery.  Transaminitis -In the setting of biliary pancreatitis and hepatic steatosis -Hepatitis panel negative for acute infection. -MRCP demonstrating fatty liver changes and no other structural abnormalities. -Continue patient follow-up with GI service as an outpatient. -Lifestyle changes, modified carbohydrate and low-fat diet recommended at time of discharge. -LFTs continue trending down appropriately.  GERD (gastroesophageal reflux disease) -Continue PPI.  Class 2 obesity due to excess calories in adult - Low calorie diet, portion control and increase physical activity discussed with patient. -Body mass index is 37.66 kg/m.  Prediabetes - A1c 7.3 -Patient expressed instructed to take metformin in the past; he has been not taking medications and just following diet control and lifestyle changes. -Continue sliding scale insulin while inpatient. -Will recommend the use of metformin (starting at 500 mg twice a day using extended release form) along with continued lifestyle changes and modified carbohydrate diet at discharge.  Attention deficit disorder (ADD) without hyperactivity -Continue holding Concerta while inpatient and barely tolerating orals.. -This medication has also been noted to cause transaminitis. -As long as LFTs levels continue to improve/resolved this medication can be resumed at discharge.  Malrotation, congenital -Patient with congenital intestinal malrotation; with component of umbilical hernia which appeared to be currently incarcerated. -Umbilical hernia has been repaired. -Status post surgical intervention with removal of appendix and cecum; postoperative care per general surgery service. -No bowel movements or flatus reported.  Postoperative ileus -KUB demonstrating signs of  ileus. -Patient denies bowel movements or ability to pass gas. -Patient encouraged to increase physical activity, continue clear liquid diet and as needed analgesics -Continue to follow general surgery recommendation.   Subjective:  Tmax 100.3 overnight; no chest pain, no nausea, no vomiting, still no bowel movements or ability to pass gas reported.  Continues to have abdominal discomfort and has experienced indigestion symptoms overnight.  Physical Exam: Vitals:   12/27/21 2226 12/27/21 2318 12/28/21 0352 12/28/21 0807  BP: 118/66 118/68 (!) 114/59 118/63  Pulse: (!) 121 (!) 119 91 (!) 104  Resp:    18  Temp: 99.4 F (37.4 C) 99.5 F (37.5 C) 98.1 F (36.7 C) 99.1 F (37.3 C)  TempSrc: Oral Oral Oral Oral  SpO2: 98% 96% 99% 96%  Weight:      Height:       General exam: Alert, awake, oriented x 3; reporting still no bowel movements or passing gas.  Expressed ongoing abdominal discomfort and overnight complaints of indigestion.  He has a Tmax of 100.3 Respiratory system: Clear to auscultation. Respiratory effort normal.  No using accessory muscles.  Good saturation on room air. Cardiovascular system: Sinus tachycardia appreciated; no rubs, no gallops, unable to properly assess JVD with body habitus. Gastrointestinal system: Abdomen is mildly distended, appropriately tender around port sites from surgical intervention; no guarding, decreased to absent bowel sounds appreciated.   Central nervous system: Alert and oriented. No focal neurological deficits. Extremities: No cyanosis or clubbing. Skin: No petechiae. Psychiatry: Judgement and insight appear normal. Mood & affect appropriate.    Data Reviewed: Most recent CBC demonstrating WBCs of 12.8, hemoglobin 14.3 and platelets count 226 K Most recent lipase 33 Complete metabolic panel: Sodium 135, potassium 3.3, BUN 9, creatinine 0.76, bilirubin 1.3, alkaline phosphatase 101, ALT 259 and AST 61.  Family Communication: No family at  bedside.  Disposition: Status is: Inpatient Remains inpatient appropriate because: Status post cholecystectomy and umbilical hernia repair; advancing diet and activity to rule out possibility of developing ileus.   Planned Discharge Destination: Home   Author: Vassie Loll, MD 12/28/2021 8:35 AM  For on call review [email or web address].

## 2021-12-28 NOTE — Progress Notes (Signed)
PT was able to ambulate in the hall without complications. Pt was able to shower and has complained of minimal pain at this time.

## 2021-12-28 NOTE — Progress Notes (Signed)
Doctor at bedside. Dr. Thomes Dinning informed this nurse to hold off from giving any Dilaudid 1mg  IV due to the patient being so lethargic and to let him know when patient is in need of pain medication. Will continue to monitor patient.

## 2021-12-28 NOTE — Progress Notes (Signed)
Dr. Thomes Dinning at bedside to assess patient.

## 2021-12-28 NOTE — Progress Notes (Signed)
The patient received toradol IV for pain per Dr. Thomes Dinning. He also got mylanta  for indigestion and later was given pantoprazole 40mg  to help relieve his indigestion. Dr. aware of patient status. Patient slept the rest of the shift and vitals went from red MEWS at beginning of shift to green MEWS. Blood cultures where drawn at beginning of shift pending results.

## 2021-12-29 ENCOUNTER — Inpatient Hospital Stay (HOSPITAL_COMMUNITY): Payer: BC Managed Care – PPO

## 2021-12-29 DIAGNOSIS — F988 Other specified behavioral and emotional disorders with onset usually occurring in childhood and adolescence: Secondary | ICD-10-CM | POA: Diagnosis not present

## 2021-12-29 DIAGNOSIS — K219 Gastro-esophageal reflux disease without esophagitis: Secondary | ICD-10-CM | POA: Diagnosis not present

## 2021-12-29 DIAGNOSIS — E6609 Other obesity due to excess calories: Secondary | ICD-10-CM | POA: Diagnosis not present

## 2021-12-29 DIAGNOSIS — K851 Biliary acute pancreatitis without necrosis or infection: Secondary | ICD-10-CM | POA: Diagnosis not present

## 2021-12-29 LAB — CBC WITH DIFFERENTIAL/PLATELET
Abs Immature Granulocytes: 0.17 10*3/uL — ABNORMAL HIGH (ref 0.00–0.07)
Basophils Absolute: 0.1 10*3/uL (ref 0.0–0.1)
Basophils Relative: 1 %
Eosinophils Absolute: 0.2 10*3/uL (ref 0.0–0.5)
Eosinophils Relative: 2 %
HCT: 35.8 % — ABNORMAL LOW (ref 39.0–52.0)
Hemoglobin: 12 g/dL — ABNORMAL LOW (ref 13.0–17.0)
Immature Granulocytes: 2 %
Lymphocytes Relative: 21 %
Lymphs Abs: 2.1 10*3/uL (ref 0.7–4.0)
MCH: 31.2 pg (ref 26.0–34.0)
MCHC: 33.5 g/dL (ref 30.0–36.0)
MCV: 93 fL (ref 80.0–100.0)
Monocytes Absolute: 1.3 10*3/uL — ABNORMAL HIGH (ref 0.1–1.0)
Monocytes Relative: 13 %
Neutro Abs: 6.3 10*3/uL (ref 1.7–7.7)
Neutrophils Relative %: 61 %
Platelets: 220 10*3/uL (ref 150–400)
RBC: 3.85 MIL/uL — ABNORMAL LOW (ref 4.22–5.81)
RDW: 13 % (ref 11.5–15.5)
WBC: 10.1 10*3/uL (ref 4.0–10.5)
nRBC: 0 % (ref 0.0–0.2)

## 2021-12-29 LAB — COMPREHENSIVE METABOLIC PANEL
ALT: 164 U/L — ABNORMAL HIGH (ref 0–44)
AST: 28 U/L (ref 15–41)
Albumin: 2.8 g/dL — ABNORMAL LOW (ref 3.5–5.0)
Alkaline Phosphatase: 81 U/L (ref 38–126)
Anion gap: 3 — ABNORMAL LOW (ref 5–15)
BUN: 11 mg/dL (ref 6–20)
CO2: 28 mmol/L (ref 22–32)
Calcium: 8.2 mg/dL — ABNORMAL LOW (ref 8.9–10.3)
Chloride: 108 mmol/L (ref 98–111)
Creatinine, Ser: 0.7 mg/dL (ref 0.61–1.24)
GFR, Estimated: >60 mL/min (ref >60)
Glucose, Bld: 122 mg/dL — ABNORMAL HIGH (ref 70–99)
Potassium: 3.1 mmol/L — ABNORMAL LOW (ref 3.5–5.1)
Sodium: 139 mmol/L (ref 135–145)
Total Bilirubin: 1.1 mg/dL (ref 0.3–1.2)
Total Protein: 6.4 g/dL — ABNORMAL LOW (ref 6.5–8.1)

## 2021-12-29 LAB — GLUCOSE, CAPILLARY
Glucose-Capillary: 112 mg/dL — ABNORMAL HIGH (ref 70–99)
Glucose-Capillary: 129 mg/dL — ABNORMAL HIGH (ref 70–99)
Glucose-Capillary: 142 mg/dL — ABNORMAL HIGH (ref 70–99)
Glucose-Capillary: 137 mg/dL — ABNORMAL HIGH (ref 70–99)

## 2021-12-29 LAB — SURGICAL PATHOLOGY

## 2021-12-29 IMAGING — DX DG CHEST 1V PORT
1 series · 1 of 1 positions shown · non-contrast
Comparison: [DATE].

CLINICAL DATA: Cough.

EXAM:
PORTABLE CHEST 1 VIEW

[chest ap]
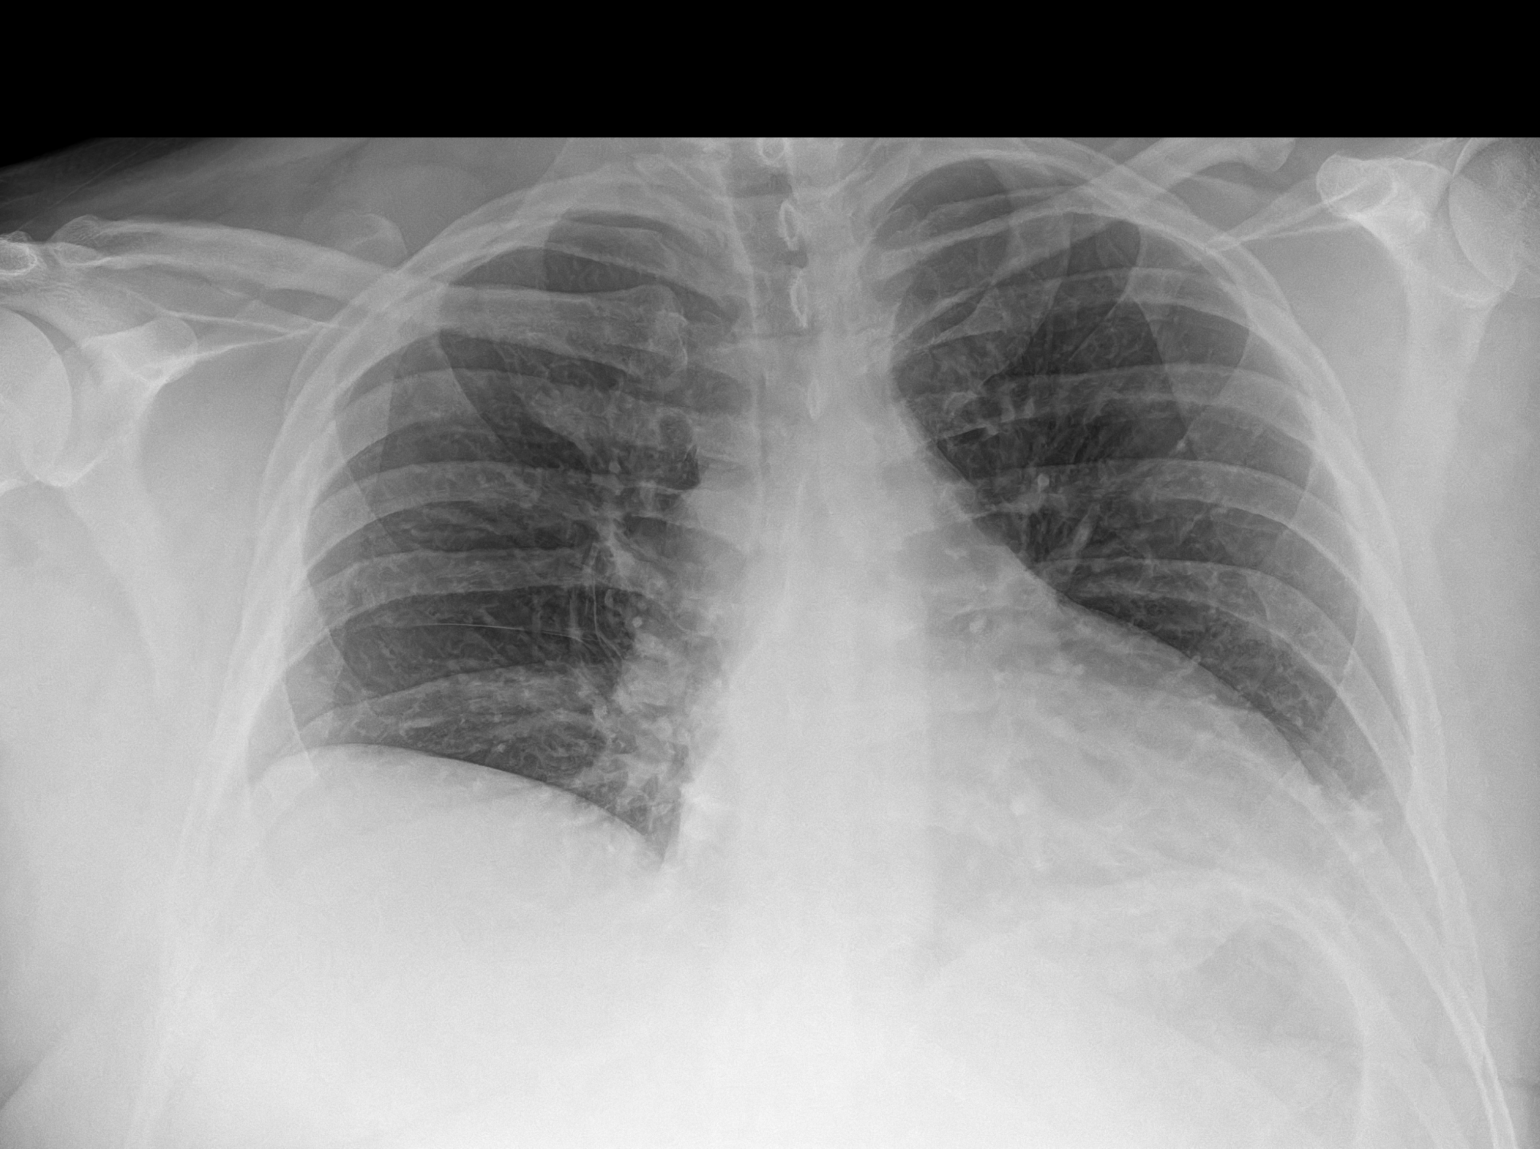

[1 of 1 positions shown; findings below may reference images not displayed]

FINDINGS: The heart size and mediastinal contours are within normal limits.
Both lungs are clear. The visualized skeletal structures are
unremarkable.
IMPRESSION: No active disease.

## 2021-12-29 MED ORDER — POTASSIUM CHLORIDE CRYS ER 20 MEQ PO TBCR
40.0000 meq | EXTENDED_RELEASE_TABLET | Freq: Once | ORAL | Status: AC
Start: 2021-12-29 — End: 2021-12-29
  Administered 2021-12-29: 40 meq via ORAL
  Filled 2021-12-29: qty 2

## 2021-12-29 MED ORDER — POTASSIUM CHLORIDE CRYS ER 20 MEQ PO TBCR
40.0000 meq | EXTENDED_RELEASE_TABLET | Freq: Once | ORAL | Status: AC
  Administered 2021-12-29: 40 meq via ORAL
  Filled 2021-12-29: qty 2

## 2021-12-29 MED ORDER — ONDANSETRON 4 MG PO TBDP
ORAL_TABLET | ORAL | Status: AC
  Filled 2021-12-29: qty 1

## 2021-12-29 MED ORDER — ONDANSETRON 4 MG PO TBDP
4.0000 mg | ORAL_TABLET | Freq: Three times a day (TID) | ORAL | Status: DC | PRN
  Administered 2021-12-29: 4 mg via ORAL
  Filled 2021-12-29: qty 1

## 2021-12-29 MED ORDER — POTASSIUM CHLORIDE 20 MEQ PO PACK
60.0000 meq | PACK | Freq: Once | ORAL | Status: DC
  Filled 2021-12-29: qty 3

## 2021-12-29 MED ORDER — ONDANSETRON 4 MG PO TBDP
4.0000 mg | ORAL_TABLET | Freq: Once | ORAL | Status: AC
  Administered 2021-12-29: 4 mg via ORAL

## 2021-12-29 NOTE — Progress Notes (Addendum)
Overnight, the patient's temperature increased to 100 F (oral). PRN Ibuprofen administered by  RN for pain. He also c/o chills and feeling feverish. RN observed sweat on patients forehead at that time. After using the tabletop fan and ice packs temperature came down to 98.5 F (oral).

## 2021-12-29 NOTE — Progress Notes (Addendum)
[facility]  Eating and having Bms. Still with some nausea. Lost his IV but pain ok with orals. Feels congested and has a cough. Worried about pna.   BP 129/85 (BP Location: Left Arm)   Pulse 100   Temp 99.1 F (37.3 C) (Oral)   Resp 18   Ht 5\' 11"  (1.803 m)   Wt 122.5 kg   SpO2 96%   BMI 37.66 kg/m   Soft, binder in place  Patient s/p laparoscopic cholecystectomy ladd's procedure and umbilical hernia repair with gallstone pancreatitis, malrotation and umbilical hernia with incarcerated omentum  PRN For pain Diet, simethicone  Ambulating CXR for possible cough/ congestion/ ? PNA, very unlikely  No indication for antibiotics at this time SCDs, lovenox  Will plan for home tomorrow, want to make sure is able to tolerate diet.   , MD [facility] [address] [place], [place] [phone] (office)

## 2021-12-29 NOTE — Progress Notes (Signed)
Progress Note   Patient: Brandon Dominguez [id number] DOB: [birth year] DOA: 12/25/2021     4 DOS: the patient was seen and examined on 12/29/2021   Brief hospital course: [name] is a 36 y.o. male with medical history significant of class II obesity, gastroesophageal flux disease, ADHD, history of diverticulosis, hepatic steatosis and prediabetes; who presented to the hospital secondary to abdominal pain, nausea/vomiting.  Symptoms has been present for the last 4-5 days and worsening; in the last 24 hours had difficulty keeping things down and reports discomfort worsen with oral intake.  He has not noticed any hematemesis, patient's pain mainly located in the epigastric area to mid abdomen, reports to be intermittent, radiates to lower aspect of his chest region and back, 8-9 out of 10 in intensity; relieved in 2 occasions after vomiting and worsening with oral intake.  Patient reports no fever, no sick contacts, no diarrhea, no melena, no hematochezia, no dysuria, no hematuria, no focal weaknesses or any other complaints.   In the ED abdominal ultrasound failed to demonstrate any gallstones but reveal mild dilatation of CBD.  Patient blood work remarkable for significant transaminitis and elevated lipase.  MRCP demonstrated gallstone pancreatitis and no choledocholithiasis.   TRH has been called to place patient in the hospital for further evaluation and management.   Assessment and Plan: * Acute pancreatitis - Without significant history of alcohol abuse or significant derangement appreciated on lipid panel. -MRCP demonstrating biliary pancreatitis origin. -No obstructing stone appreciated. -Status post laparoscopic cholecystectomy, Ladds procedure and umbilical hernia repair on 12/26/2021. -LFTs continue to trend down and essentially resolved by now.   -Continue supportive care, as needed analgesics and continue advancing diet as per general surgery recommendations. -Continue postoperative  care. -No nausea or vomiting.  Currently tolerating liquid diet.  Patient reported passing gas and moving bowels. -Lipase within normal limits. -Medically stable and ready for discharge from internal medicine standpoint.  We will remain available if anything needed.  Incarcerated umbilical hernia -Status post surgical repair. -Follow-up postoperative care per general surgery.  Transaminitis -In the setting of biliary pancreatitis and hepatic steatosis -Hepatitis panel negative for acute infection. -MRCP demonstrating fatty liver changes and no other structural abnormalities. -Continue patient follow-up with GI service as an outpatient. -Lifestyle changes, modified carbohydrate and low-fat diet recommended at time of discharge. -LFTs continue trending down appropriately.  GERD (gastroesophageal reflux disease) -Continue PPI.  Class 2 obesity due to excess calories in adult - Low calorie diet, portion control and increase physical activity discussed with patient. -Body mass index is 37.66 kg/m.  Prediabetes/type 2 diabetes; no insulin usage and no complications. - A1c 7.3 -Patient expressed instructed to take metformin in the past; he has been not taking medications and just following diet control and lifestyle changes. -Continue sliding scale insulin while inpatient. -Will recommend the use of metformin XR (starting at 500 mg twice a day using extended release form) along with continued lifestyle changes and modified carbohydrate diet at discharge.  Attention deficit disorder (ADD) without hyperactivity -Continue holding Concerta while inpatient and barely tolerating orals.. -This medication has also been noted to cause transaminitis. -As long as LFTs levels continue to improve/resolved this medication can be resumed at discharge.  Malrotation, congenital -Patient with congenital intestinal malrotation; with component of umbilical hernia which appeared to be currently  incarcerated. -Umbilical hernia has been repaired. -Status post surgical intervention with removal of appendix and cecum; postoperative care per general surgery service. -Patient reports moving bowels and passing gas.  Postoperative ileus -KUB demonstrating signs of ileus. -Patient reports passing gas, moving bowels and so far tolerating liquid diet -Continue supportive care and follow postoperative care and diet advancement as per general surgery service. -Patient once again encouraged to increase physical activity, continue clear liquid diet and as needed analgesics  Subjective:  Tmax 100.1; no temperature appreciated throughout the day.  Overall feeling better.  Tolerating liquid diet.  Reports having a small bowel movement and is passing gas.  Physical Exam: Vitals:   12/28/21 2315 12/29/21 0204 12/29/21 0458 12/29/21 1353  BP:   123/69 129/85  Pulse:   65 100  Resp:   18 18  Temp: 99.5 F (37.5 C) 98.5 F (36.9 C) 98.2 F (36.8 C) 99.1 F (37.3 C)  TempSrc: Oral Oral Oral Oral  SpO2:   96% 96%  Weight:      Height:       General exam: Alert, awake, oriented x 3; reporting intermittent abdominal discomfort.  Passing gas, so far tolerating liquid diet. Respiratory system: Clear to auscultation. Respiratory effort normal.  No using accessory muscle.  Good saturation on room air. Cardiovascular system:RRR. No rubs or gallops; unable to properly assess JVD due to body habitus. Gastrointestinal system: Abdomen appropriately tender to palpation; soft, no guarding, decreased but present bowel sounds.  Positive binder in place. Central nervous system: Alert and oriented. No focal neurological deficits. Extremities: No cyanosis or clubbing. Skin: No petechiae. Psychiatry: Judgement and insight appear normal. Mood & affect appropriate.   Data Reviewed: Chest x-ray not demonstrating acute cardiopulmonary process  CBC with WBCs of 12.1, hemoglobin 12.0 and platelet count 220  K Comprehensive metabolic panel demonstrating sodium 139, potassium 3.1, chloride 108, BUN 11, creatinine 0.70, AST 28, ALT 164, alkaline phosphatase 81 GFR more than 60.   Family Communication: No family at bedside.  Disposition: Status is: Inpatient Remains inpatient appropriate because: Status post cholecystectomy and umbilical hernia repair; advancing diet and activity to rule out possibility of developing ileus.   Planned Discharge Destination: Home   Author: Barton Dubois, MD 12/29/2021 6:29 PM  For on call review [email or web address].

## 2021-12-30 LAB — GLUCOSE, CAPILLARY: Glucose-Capillary: 116 mg/dL — ABNORMAL HIGH (ref 70–99)

## 2021-12-30 MED ORDER — OXYCODONE HCL 5 MG PO TABS: 5.0000 mg | ORAL_TABLET | ORAL | 0 refills | Status: DC | PRN

## 2021-12-30 MED ORDER — DOCUSATE SODIUM 100 MG PO CAPS: 100.0000 mg | ORAL_CAPSULE | Freq: Two times a day (BID) | ORAL | 0 refills | Status: DC | PRN

## 2021-12-30 MED ORDER — ONDANSETRON 4 MG PO TBDP: 4.0000 mg | ORAL_TABLET | Freq: Three times a day (TID) | ORAL | 0 refills | Status: DC | PRN

## 2021-12-30 NOTE — Discharge Summary (Signed)
Physician Discharge Summary  Patient ID: Brandon Dominguez MRN: [id number] DOB/AGE: [birth year] 36 y.o.  Admit date: 12/25/2021 Discharge date: 12/30/2021  Admission Diagnoses: Acute pancreatitis   Discharge Diagnoses:  Principal Problem:   Acute pancreatitis Active Problems:   Malrotation, congenital   Attention deficit disorder (ADD) without hyperactivity   Prediabetes   Class 2 obesity due to excess calories in adult   GERD (gastroesophageal reflux disease)   Transaminitis   Gallstone pancreatitis   Incarcerated umbilical hernia   Discharged Condition: good  Hospital Course: Mr. Eckhardt is a 36 yo who came in with gallstone pancreatitis and had additional issues with an incarcerated umbilical hernia and congenital malrotation. He was taken to the OR and had a laparoscopic cholecystectomy, ladd's procedure with lysis of adhesions and appendectomy and umbilical hernia repair with sutures.   Post operatively he had some issues with indigestion and ileus. This resolved with some time. His lab work all was improving and he started having bowel movements. He did have some low grade fevers to 100 F and no signs of leukocytosis. CXR was done for some congestion and coughing and this was reassuring.   An irritated area on the lower abdomen was noted by RNS and I have reviewed it. This is where his heparin/lovenox shots have been given and he was clipped. This is not close or in relation to the incisions.   Consults:  hospitalist  Significant Diagnostic Studies:  12/25/21 CLINICAL DATA:  Postprandial upper abdominal pain for 4 days   EXAM: ULTRASOUND ABDOMEN LIMITED RIGHT UPPER QUADRANT   COMPARISON:  08/03/2013 CT abdomen/pelvis   FINDINGS: Gallbladder:   No gallstones or wall thickening visualized. No sonographic Murphy sign noted by sonographer.   Common bile duct:   Diameter: 7 mm   Liver:   Diffusely echogenic liver parenchyma with posterior acoustic attenuation, compatible  with diffuse hepatic steatosis. No definite liver surface irregularity. Crescentic subcapsular hypoechoic liver focus adjacent to the gallbladder is compatible with focal fatty sparing. No liver masses, noting decreased sensitivity in the setting of an echogenic liver. Portal vein is patent on color Doppler imaging with normal direction of blood flow towards the liver.   Other: None.   IMPRESSION: 1. Mild common bile duct dilation (7 mm diameter). Recommend correlation with serum bilirubin level. MRI abdomen without and with IV contrast with MRCP may be considered as clinically warranted. 2. Normal gallbladder, with no cholelithiasis. 3. Diffuse hepatic steatosis with focal fatty sparing adjacent to the gallbladder.     Electronically Signed   By: Ilona Sorrel M.D.   On: 12/25/2021 08:10  CLINICAL DATA:  Right lower quadrant pain x3 days, umbilical hernia   EXAM: CT ABDOMEN AND PELVIS WITHOUT CONTRAST   TECHNIQUE: Multidetector CT imaging of the abdomen and pelvis was performed following the standard protocol without IV contrast.   RADIATION DOSE REDUCTION: This exam was performed according to the departmental dose-optimization program which includes automated exposure control, adjustment of the mA and/or kV according to patient size and/or use of iterative reconstruction technique.   COMPARISON:  08/03/2013   FINDINGS: Lower chest: Small linear densities seen in the lower lung fields suggesting scarring or minimal subsegmental atelectasis.   Hepatobiliary: No focal abnormality is seen in the liver. There is no dilation of bile ducts. There is mild stranding in the fat planes adjacent to gallbladder. Gallbladder is not distended.   Pancreas: There is mild stranding in the peripancreatic fat. There are no loculated fluid collections in or around  the pancreas. There is no dilation of pancreatic duct.   Spleen: Unremarkable.   Adrenals/Urinary Tract: Adrenals are  unremarkable. There is no hydronephrosis. There is subtle increase in density in the some of the minor calices. Significance of this finding is not clear. This may be related to patient's hydration status or suggest presence of tiny renal stones. No discrete measurable calculi are noted. There are few smooth marginated low-density lesions in the kidneys largest in the upper pole of right kidney measuring 3.1 cm. Findings suggest possible renal cysts. Some of the smaller lesions could not be optimally characterized.   Stomach/Bowel: Stomach is not distended. Small bowel loops are not dilated. Most of the small bowel loops are noted in the right side of the abdomen with most of the colon lying in the left side of the abdomen suggesting congenital malrotation. Appendix is not dilated. Numerous diverticula are seen in the colon without signs of focal acute diverticulitis.   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   Reproductive: There are few coarse calcifications in the prostate.   Other: There is no ascites or pneumoperitoneum. There is significant interval increase in size of umbilical hernia. Umbilical hernial sac measures proximally 13.5 x 11.1 x 8.7 cm. There is fat within the large umbilical hernia. No bowel loops are noted. There is minimal stranding in the fat planes within the hernia without any loculated fluid collections.   Musculoskeletal: No acute findings are seen.   IMPRESSION: There is stranding in the fat planes adjacent to pancreas and gallbladder. Findings suggest possible mild pancreatitis and possibly cholecystitis. Please correlate with clinical symptoms and laboratory findings and consider follow-up sonogram. There is no dilation of bile ducts.   There is large umbilical hernia measuring 13.5 cm in maximum diameter containing fat. There is minimal stranding in the fat planes in the hernial sac which may suggest mild acute or  chronic inflammation. There is no loculated fluid collection within the umbilical hernia. There are no bowel loops within the hernia.   Diverticulosis of colon without signs of focal diverticulitis. Appendix is not dilated.   Other findings as described in the body of the report.     Electronically Signed   By: Elmer Picker M.D.   On: 12/25/2021 14:44    Treatments: Laparoscopic cholecystectomy, ladd's procedure, primary umbilical hernia repair [id number]  Discharge Exam: Blood pressure 114/62, pulse 73, temperature 97.7 F (36.5 C), temperature source Oral, resp. rate 16, height 5\' 11"  (1.803 m), weight 122.5 kg, SpO2 96 %. General appearance: alert and no distress Resp: normal work of breathing GI: soft, nondistended, port sites and umbilical hernia with dermabond without erythema or drainage, bruising evolving, lower abdomen with irritation from the heparin sq and clipping, no cellulitis noted    Disposition: Discharge disposition: 01-Home or Self Care       Discharge Instructions     Call MD for:  difficulty breathing, headache or visual disturbances   Complete by: As directed    Call MD for:  difficulty breathing, headache or visual disturbances   Complete by: As directed    Call MD for:  extreme fatigue   Complete by: As directed    Call MD for:  extreme fatigue   Complete by: As directed    Call MD for:  persistant dizziness or light-headedness   Complete by: As directed    Call MD for:  persistant dizziness or light-headedness   Complete by: As directed    Call  MD for:  persistant nausea and vomiting   Complete by: As directed    Call MD for:  persistant nausea and vomiting   Complete by: As directed    Call MD for:  redness, tenderness, or signs of infection (pain, swelling, redness, odor or green/yellow discharge around incision site)   Complete by: As directed    Call MD for:  redness, tenderness, or signs of infection (pain, swelling, redness, odor  or green/yellow discharge around incision site)   Complete by: As directed    Call MD for:  severe uncontrolled pain   Complete by: As directed    Call MD for:  severe uncontrolled pain   Complete by: As directed    Call MD for:  temperature >100.4   Complete by: As directed    Call MD for:  temperature >100.4   Complete by: As directed    Diet - low sodium heart healthy   Complete by: As directed    Increase activity slowly   Complete by: As directed    Increase activity slowly   Complete by: As directed       Allergies as of 12/30/2021   No Known Allergies      Medication List     TAKE these medications    docusate sodium 100 MG capsule Commonly known as: COLACE Take 1 capsule (100 mg total) by mouth 2 (two) times daily as needed for mild constipation.   methylphenidate 27 MG CR tablet Commonly known as: Concerta Take 1 tablet (27 mg total) by mouth every morning. Fill 60 days after prescription date.   Concerta 27 MG CR tablet Generic drug: methylphenidate Take 1 tablet (27 mg total) by mouth every morning. Take only on workdays.   Concerta 27 MG CR tablet Generic drug: methylphenidate Take 1 tablet (27 mg total) by mouth every morning. Fill 30 days after prescription date   ondansetron 4 MG disintegrating tablet Commonly known as: ZOFRAN-ODT Take 1 tablet (4 mg total) by mouth every 8 (eight) hours as needed for nausea or vomiting.   oxyCODONE 5 MG immediate release tablet Commonly known as: Oxy IR/ROXICODONE Take 1 tablet (5 mg total) by mouth every 4 (four) hours as needed for breakthrough pain or severe pain.   pantoprazole 40 MG tablet Commonly known as: PROTONIX Take 1 tablet (40 mg total) by mouth daily. Take one tablet in the morning 30 minutes before breakfast.        Follow-up Information     Virl Cagey, MD Follow up on 01/22/2022.   Specialty: General Surgery Why: hernia check Contact information: [address] [place] [facility]  [address] [phone]                Future Appointments  Date Time Provider [place]  01/22/2022  3:00 PM Virl Cagey, MD [facility] None     Signed: Virl Cagey 12/30/2021, 2:15 PM

## 2021-12-30 NOTE — Discharge Instructions (Signed)
Discharge Laparoscopic Surgery Instructions:  Common Complaints: Right shoulder pain is common after laparoscopic surgery. This is secondary to the gas used in the surgery being trapped under the diaphragm.  Walk to help your body absorb the gas. This will improve in a few days. Pain at the port sites are common, especially the larger port sites. This will improve with time.  Some nausea is common and poor appetite. The main goal is to stay hydrated the first few days after surgery.   Diet/ Activity: Diet as tolerated. You may not have an appetite, but it is important to stay hydrated. Drink 64 ounces of water a day. Your appetite will return with time.  Shower per your regular routine daily.  Do not take hot showers. Take warm showers that are less than 10 minutes. Rest and listen to your body, but do not remain in bed all day.  Walk everyday for at least 15-20 minutes. Deep cough and move around every 1-2 hours in the first few days after surgery.  Do not lift > 10 lbs, perform excessive bending, pushing, pulling, squatting for 6-8 weeks after surgery.  Wear your binder as tolerated for your hernia repair.  Do not pick at the dermabond glue on your incision sites.  This glue film will remain in place for 1-2 weeks and will start to peel off.  Do not place lotions or balms on your incision unless instructed to specifically by Dr. Henreitta Leber.   Pain Expectations and Narcotics: -After surgery you will have pain associated with your incisions and this is normal. The pain is muscular and nerve pain, and will get better with time. -You are encouraged and expected to take non narcotic medications like tylenol and ibuprofen (when able) to treat pain as multiple modalities can aid with pain treatment. -Narcotics are only used when pain is severe or there is breakthrough pain. -You are not expected to have a pain score of 0 after surgery, as we cannot prevent pain. A pain score of 3-4 that allows you to  be functional, move, walk, and tolerate some activity is the goal. The pain will continue to improve over the days after surgery and is dependent on your surgery. -Due to [place] law, we are only able to give a certain amount of pain medication to treat post operative pain, and we only give additional narcotics on a patient by patient basis.  -For most laparoscopic surgery, studies have shown that the majority of patients only need 10-15 narcotic pills, and for open surgeries most patients only need 15-20.   -Having appropriate expectations of pain and knowledge of pain management with non narcotics is important as we do not want anyone to become addicted to narcotic pain medication.  -Using ice packs in the first 48 hours and heating pads after 48 hours, wearing an abdominal binder (when recommended), and using over the counter medications are all ways to help with pain management.   -Simple acts like meditation and mindfulness practices after surgery can also help with pain control and research has proven the benefit of these practices.  Medication: Take tylenol and ibuprofen as needed for pain control, alternating every 4-6 hours.  Example:  Tylenol 1000mg  @ 6am, 12noon, 6pm, (Do not exceed 4000mg  of tylenol a day). Ibuprofen 800mg  @ 9am, 3pm, 9pm, 3am (Do not exceed 3600mg  of ibuprofen a day).  Take Roxicodone for breakthrough pain every 4 hours.  Take Colace for constipation related to narcotic pain medication. If you do  not have a bowel movement in 2 days, take Miralax over the counter.  Drink plenty of water to also prevent constipation.   Contact Information: If you have questions or concerns, please call our office, [phone], Monday- Thursday 8AM-5PM and Friday 8AM-12Noon.  If it is after hours or on the weekend, please call [facility] Main Number, [phone], [phone], and ask to speak to the surgeon on call for Dr. Henreitta Leber at [facility].

## 2021-12-30 NOTE — Progress Notes (Signed)
Nsg Discharge Note  Admit Date:  12/25/2021 Discharge date: 12/30/2021   Lilla Shook to be D/C'd Home per MD order.  AVS completed.  Copy for chart, and copy for patient signed, and dated. Patient/caregiver able to verbalize understanding.  Discharge Medication: Allergies as of 12/30/2021   No Known Allergies      Medication List     TAKE these medications    docusate sodium 100 MG capsule Commonly known as: COLACE Take 1 capsule (100 mg total) by mouth 2 (two) times daily as needed for mild constipation.   methylphenidate 27 MG CR tablet Commonly known as: Concerta Take 1 tablet (27 mg total) by mouth every morning. Fill 60 days after prescription date.   Concerta 27 MG CR tablet Generic drug: methylphenidate Take 1 tablet (27 mg total) by mouth every morning. Take only on workdays.   Concerta 27 MG CR tablet Generic drug: methylphenidate Take 1 tablet (27 mg total) by mouth every morning. Fill 30 days after prescription date   ondansetron 4 MG disintegrating tablet Commonly known as: ZOFRAN-ODT Take 1 tablet (4 mg total) by mouth every 8 (eight) hours as needed for nausea or vomiting.   oxyCODONE 5 MG immediate release tablet Commonly known as: Oxy IR/ROXICODONE Take 1 tablet (5 mg total) by mouth every 4 (four) hours as needed for breakthrough pain or severe pain.   pantoprazole 40 MG tablet Commonly known as: PROTONIX Take 1 tablet (40 mg total) by mouth daily. Take one tablet in the morning 30 minutes before breakfast.        Discharge Assessment: Vitals:   12/29/21 2246 12/30/21 0554  BP:  114/62  Pulse:  73  Resp:  16  Temp: 100 F (37.8 C) 97.7 F (36.5 C)  SpO2:  96%   Skin clean, dry and intact without evidence of skin break down, no evidence of skin tears noted. IV catheter discontinued intact. Site without signs and symptoms of complications - no redness or edema noted at insertion site, patient denies c/o pain - only slight tenderness at site.   Dressing with slight pressure applied.  D/c Instructions-Education: Discharge instructions given to patient/family with verbalized understanding. D/c education completed with patient/family including follow up instructions, medication list, d/c activities limitations if indicated, with other d/c instructions as indicated by MD - patient able to verbalize understanding, all questions fully answered. Patient instructed to return to ED, call 911, or call MD for any changes in condition.  Patient escorted via WC, and D/C home via private auto.  Demetrio Lapping, LPN 1/61/0960 4:54 AM

## 2022-01-01 ENCOUNTER — Encounter (HOSPITAL_COMMUNITY): Payer: Self-pay | Admitting: General Surgery

## 2022-01-01 ENCOUNTER — Telehealth: Payer: Self-pay | Admitting: *Deleted

## 2022-01-01 LAB — CULTURE, BLOOD (ROUTINE X 2)
Culture: NO GROWTH
Culture: NO GROWTH
Special Requests: ADEQUATE
Special Requests: ADEQUATE

## 2022-01-01 NOTE — Telephone Encounter (Signed)
Received call from patient [phone]~ telephone.   Surgical Date: 12/26/2021 Procedure: Laparoscopic cholecystectomy, Laparoscopic Ladd's procedure (adhesions lysed, appendectomy, ensuring wide base of mesentery, colon on left, small bowel on right), primary umbilical hernia repair   Patient reports that he had ileus in hospital that had resolved prior to discharge. States that he is concerned that ileus has returned.   Reports increased nausea and vomiting. States that he is passing gas and bile colored fluid stools, but no solid BM. States that he is taking Zofran as directed for nausea. Reports that he continues with Colace.   Reports that he is eating small amounts throughout the day. Reports that he feels full and has early satiety. Reports that he even feels full with drinking water.   Patient reports that he vomited on 12/31/2021 with copious amounts of emesis with non- digested food noted. Reports that he had not eaten solid food >7 hours prior to episode of vomiting.   Advised conservative treatment for ileus. Advised clear liquid diet with no solids to rest colon. Advised to increase water intake to avoid dehydration.   Advised that if severe abdominal pain noted, or if he is unable to tolerate fluids, return to ER for evaluation.   Please advise.

## 2022-01-02 ENCOUNTER — Telehealth: Payer: Self-pay | Admitting: *Deleted

## 2022-01-02 ENCOUNTER — Emergency Department (HOSPITAL_COMMUNITY)
Admission: EM | Admit: 2022-01-02 | Discharge: 2022-01-02 | Disposition: A | Discharge status: Home / Self Care | Payer: BC Managed Care – PPO | Attending: Emergency Medicine | Admitting: Emergency Medicine

## 2022-01-02 ENCOUNTER — Encounter (HOSPITAL_COMMUNITY): Payer: Self-pay | Admitting: Emergency Medicine

## 2022-01-02 ENCOUNTER — Emergency Department (HOSPITAL_COMMUNITY): Payer: BC Managed Care – PPO

## 2022-01-02 ENCOUNTER — Other Ambulatory Visit: Payer: Self-pay

## 2022-01-02 DIAGNOSIS — R1032 Left lower quadrant pain: Secondary | ICD-10-CM | POA: Diagnosis not present

## 2022-01-02 DIAGNOSIS — K5730 Diverticulosis of large intestine without perforation or abscess without bleeding: Secondary | ICD-10-CM | POA: Diagnosis not present

## 2022-01-02 LAB — COMPREHENSIVE METABOLIC PANEL
ALT: 59 U/L — ABNORMAL HIGH (ref 0–44)
AST: 22 U/L (ref 15–41)
Albumin: 3.7 g/dL (ref 3.5–5.0)
Alkaline Phosphatase: 81 U/L (ref 38–126)
Anion gap: 10 (ref 5–15)
BUN: 22 mg/dL — ABNORMAL HIGH (ref 6–20)
CO2: 24 mmol/L (ref 22–32)
Calcium: 9.1 mg/dL (ref 8.9–10.3)
Chloride: 102 mmol/L (ref 98–111)
Creatinine, Ser: 0.94 mg/dL (ref 0.61–1.24)
GFR, Estimated: >60 mL/min (ref >60)
Glucose, Bld: 119 mg/dL — ABNORMAL HIGH (ref 70–99)
Potassium: 3.5 mmol/L (ref 3.5–5.1)
Sodium: 136 mmol/L (ref 135–145)
Total Bilirubin: 1.1 mg/dL (ref 0.3–1.2)
Total Protein: 8.2 g/dL — ABNORMAL HIGH (ref 6.5–8.1)

## 2022-01-02 LAB — CBC
HCT: 46.3 % (ref 39.0–52.0)
Hemoglobin: 15.6 g/dL (ref 13.0–17.0)
MCH: 31.3 pg (ref 26.0–34.0)
MCHC: 33.7 g/dL (ref 30.0–36.0)
MCV: 92.8 fL (ref 80.0–100.0)
Platelets: 421 10*3/uL — ABNORMAL HIGH (ref 150–400)
RBC: 4.99 MIL/uL (ref 4.22–5.81)
RDW: 12.8 % (ref 11.5–15.5)
WBC: 12.1 10*3/uL — ABNORMAL HIGH (ref 4.0–10.5)
nRBC: 0 % (ref 0.0–0.2)

## 2022-01-02 LAB — URINALYSIS, ROUTINE W REFLEX MICROSCOPIC
Bacteria, UA: NONE SEEN
Bilirubin Urine: NEGATIVE
Glucose, UA: NEGATIVE mg/dL
Hgb urine dipstick: NEGATIVE
Ketones, ur: 20 mg/dL — AB
Leukocytes,Ua: NEGATIVE
Nitrite: NEGATIVE
Protein, ur: 30 mg/dL — AB
Specific Gravity, Urine: 1.025 (ref 1.005–1.030)
pH: 5 (ref 5.0–8.0)

## 2022-01-02 LAB — LACTIC ACID, PLASMA
Lactic Acid, Venous: 0.9 mmol/L (ref 0.5–1.9)
Lactic Acid, Venous: 1.1 mmol/L (ref 0.5–1.9)

## 2022-01-02 LAB — LIPASE, BLOOD: Lipase: 64 U/L — ABNORMAL HIGH (ref 11–51)

## 2022-01-02 IMAGING — CT CT ABD-PELV W/ CM
2 of 5 series · 15 of 46 positions shown, 17 images · IV contrast (Omnipaque or Isovue)
Comparison: [DATE]

CLINICAL DATA: Abdominal pain

EXAM:
CT ABDOMEN AND PELVIS WITH CONTRAST
TECHNIQUE: Multidetector CT imaging of the abdomen and pelvis was performed
using the standard protocol following bolus administration of
intravenous contrast.

[Series 2: axial st · axial · 0.98mm/px · z∈[+782,+1312]mm · 12 of 122 slices shown, 14 images]
[im 8/122  soft-tissue]
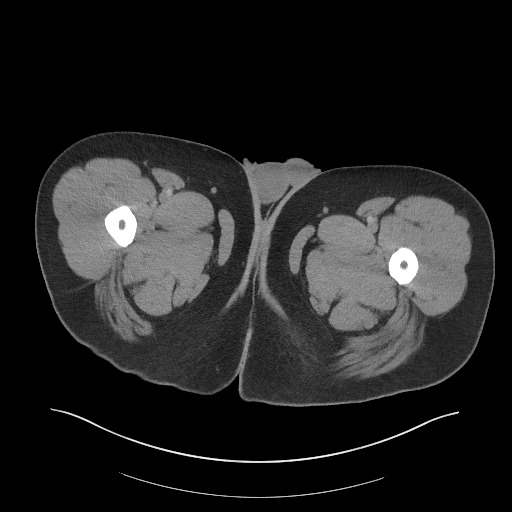
[im 8/122  bone]
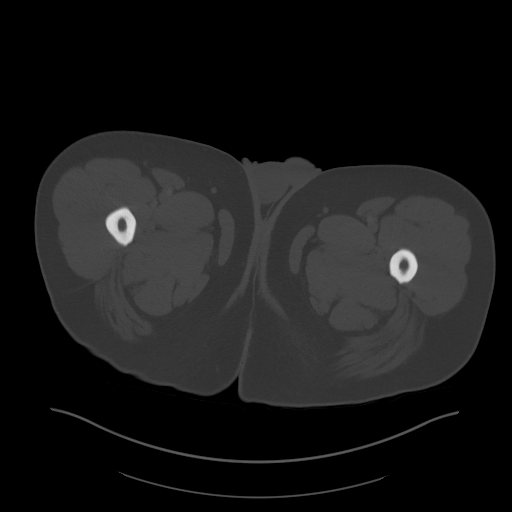
[im 16/122  soft-tissue]
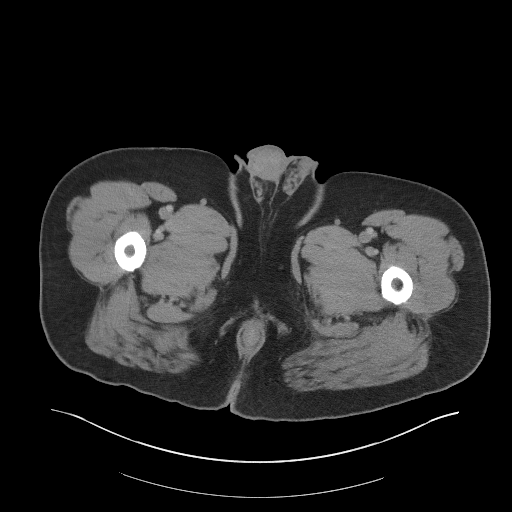
[im 31/122  soft-tissue]
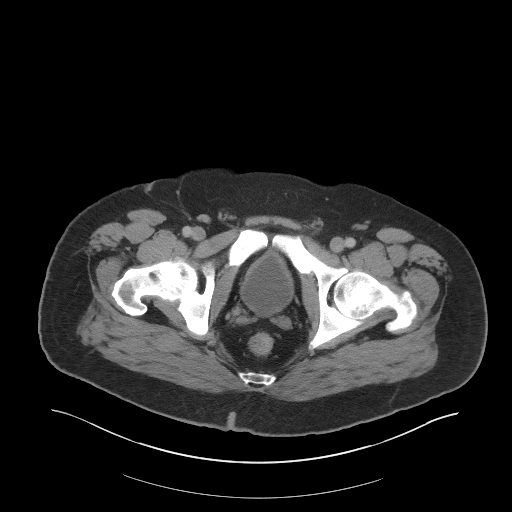
[im 38/122  soft-tissue]
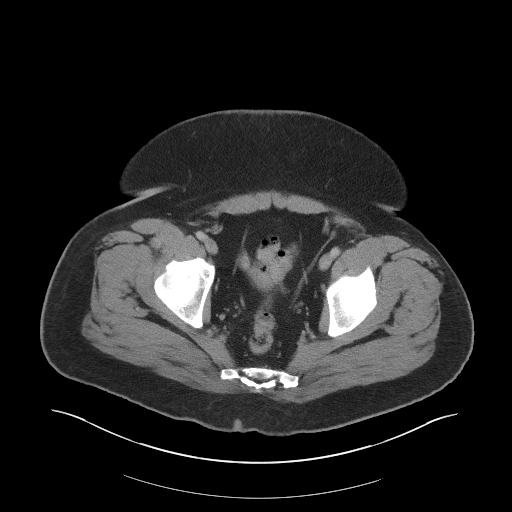
[im 46/122  soft-tissue]
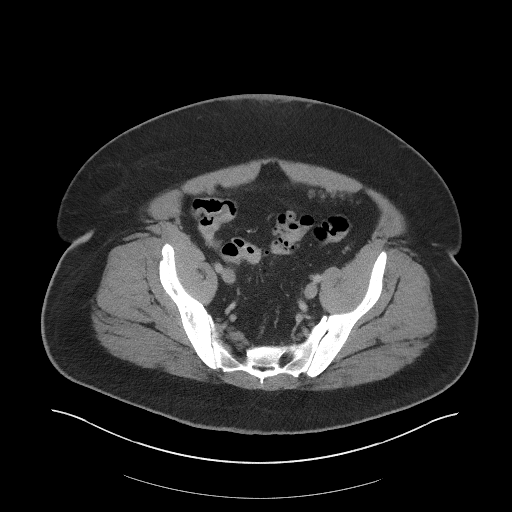
[im 53/122  soft-tissue]
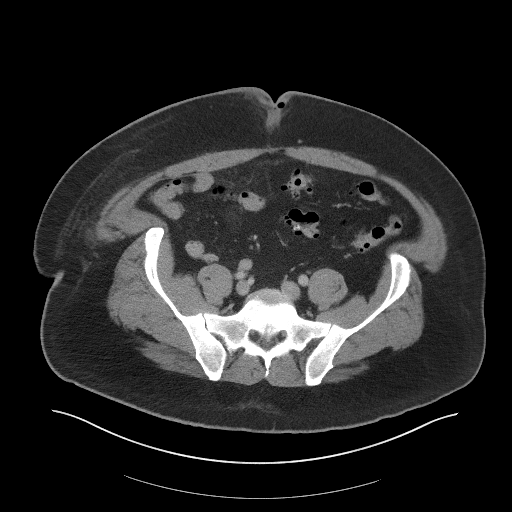
[im 69/122  soft-tissue]
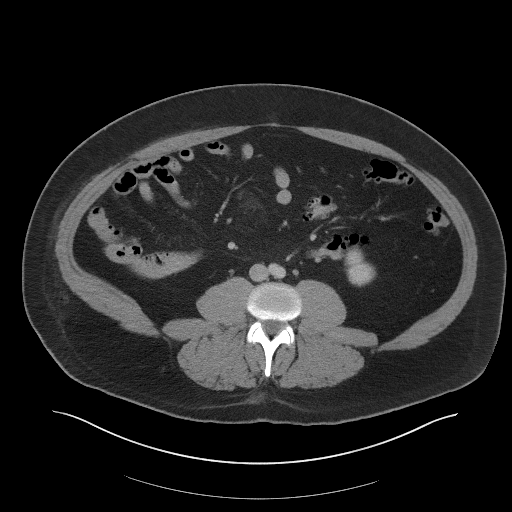
[im 76/122  soft-tissue]
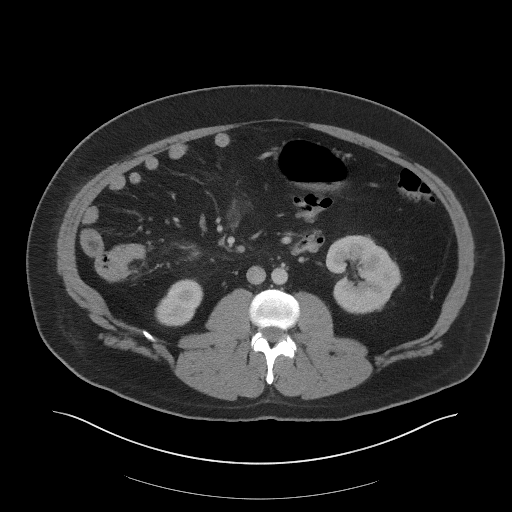
[im 84/122  soft-tissue]
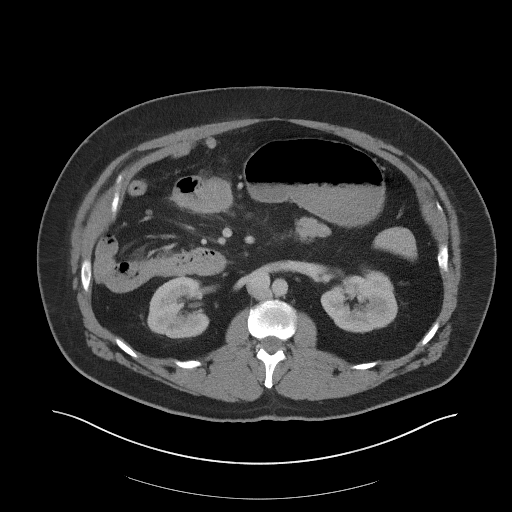
[im 84/122  bone]
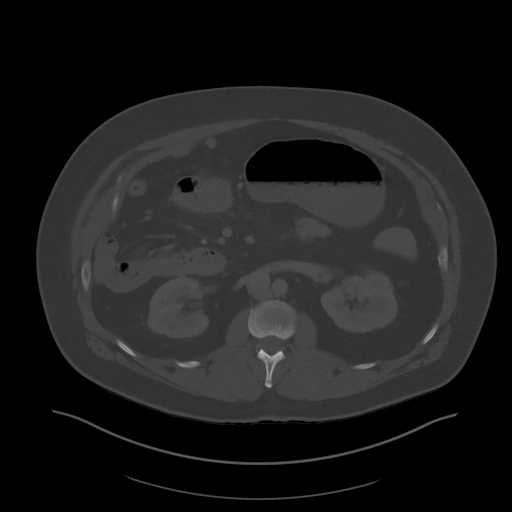
[im 91/122  soft-tissue]
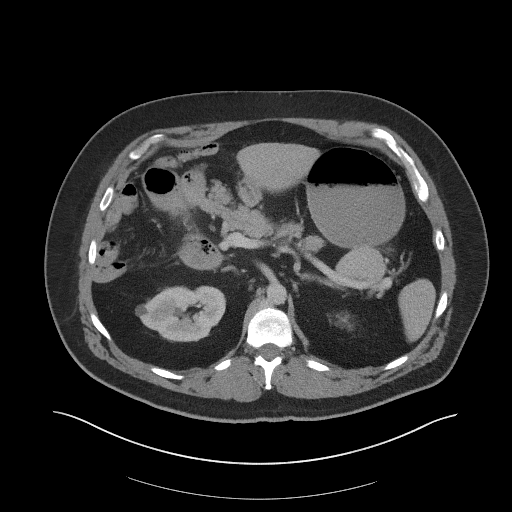
[im 106/122  soft-tissue]
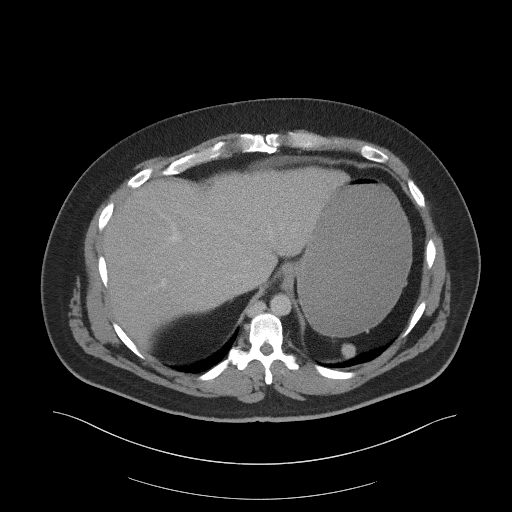
[im 114/122  soft-tissue]
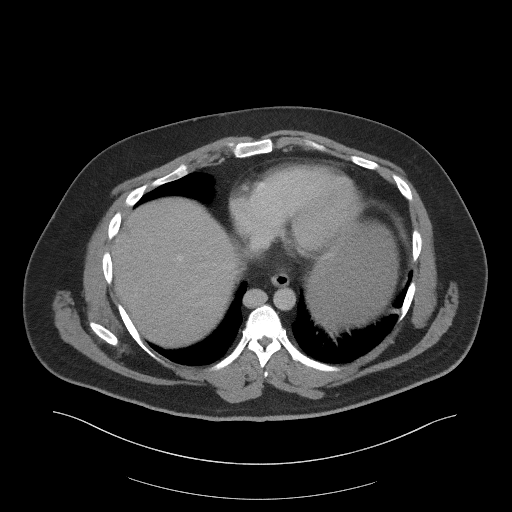

[Series 5: coronal st · coronal · 0.97mm/px · 3 of 125 slices shown]
[im 42/125  soft-tissue]
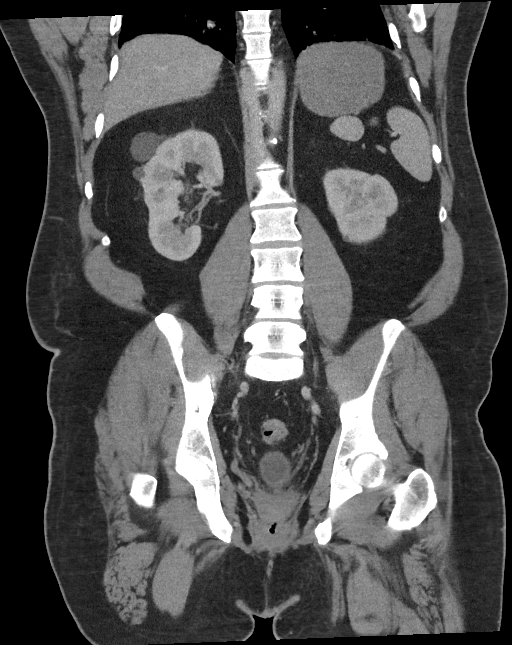
[im 56/125  soft-tissue]
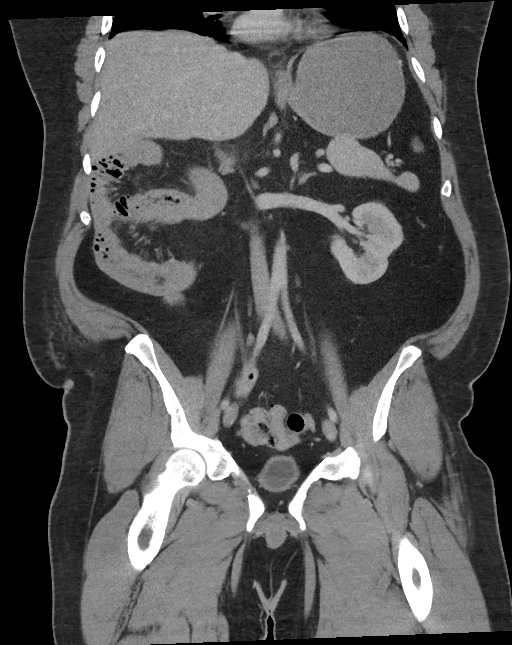
[im 69/125  soft-tissue]
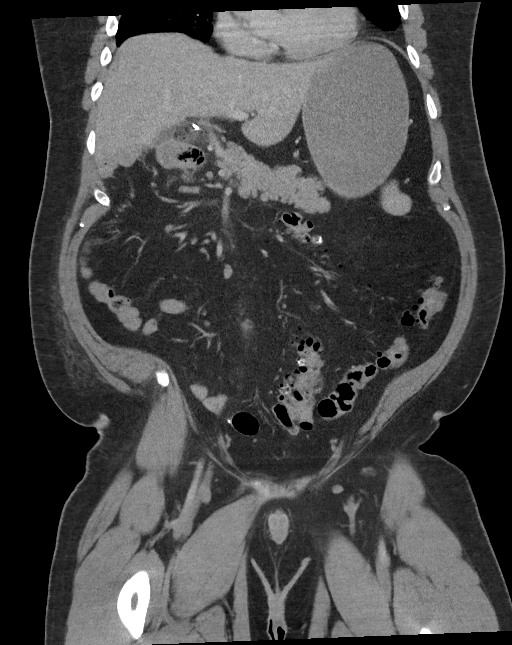

[15 of 46 positions shown; findings below may reference images not displayed]

RADIATION DOSE REDUCTION: This exam was performed according to the
departmental dose-optimization program which includes automated
exposure control, adjustment of the mA and/or kV according to
patient size and/or use of iterative reconstruction technique.

CONTRAST:  100mL OMNIPAQUE IOHEXOL 300 MG/ML  SOLN
FINDINGS: Lower chest: Breathing motion limits evaluation of lower lung
fields. Small linear densities seen in the left lower lung fields
suggesting subsegmental atelectasis.

Hepatobiliary: No focal abnormality is seen in the liver. There is
interval cholecystectomy. Small amount of fluid is seen in the
gallbladder fossa without loculations. This may be related to recent
cholecystectomy. There is no dilation of bile ducts.

Pancreas: No focal abnormality is seen.

Spleen: There are multiple splenules in the left upper quadrant.

Adrenals/Urinary Tract: Adrenals are unremarkable. There is no
hydronephrosis. Few renal cysts are seen largest in the upper pole
of right kidney measuring 3.5 cm. There are no renal or ureteral
stones. Urinary bladder is not distended.

Stomach/Bowel: Stomach is moderately distended with air-fluid level.
There is no wall thickening in the stomach. All the small-bowel
loops are in the right side of the abdomen consistent with
malrotation. Colon is in the left side of abdomen. There are
metallic densities in the margin of cecum possibly related to
appendectomy. Multiple diverticula are seen in the colon without
signs of focal acute diverticulitis.

Vascular/Lymphatic: There is azygous continuation of inferior vena
cava.

Reproductive: There are coarse calcifications in the prostate.

Other: There is no ascites or pneumoperitoneum. There is interval
surgical repair of large umbilical hernia. There is subcutaneous
stranding at the surgical site. There is stranding in the
subcutaneous plane in the epigastrium, possibly at the port site.
There are small pockets of air in the subcutaneous plane at the
surgical site most likely postsurgical change. There is no definite
demonstrable focal thick-walled fluid collection. There is skin
thickening at the surgical site.

Musculoskeletal: Unremarkable.
IMPRESSION: There is no evidence of intestinal obstruction or pneumoperitoneum.
There is no hydronephrosis. Interval cholecystectomy. Small amount
of fluid in gallbladder fossa most likely is postsurgical change.
There is no thick wall loculated fluid collection in abdomen and
pelvis. Stranding and pockets of air seen in the subcutaneous plane
in the anterior abdominal wall most likely is related to recent
surgery.

Diverticulosis of colon without signs of focal diverticulitis.

Other findings as described in the body of the report.

## 2022-01-02 MED ORDER — ONDANSETRON HCL 4 MG/2ML IJ SOLN
4.0000 mg | Freq: Once | INTRAMUSCULAR | Status: AC
  Administered 2022-01-02: 4 mg via INTRAVENOUS
  Filled 2022-01-02: qty 2

## 2022-01-02 MED ORDER — MORPHINE SULFATE (PF) 4 MG/ML IV SOLN
4.0000 mg | Freq: Once | INTRAVENOUS | Status: DC
  Filled 2022-01-02: qty 1

## 2022-01-02 MED ORDER — IOHEXOL 9 MG/ML PO SOLN
ORAL | Status: AC
  Filled 2022-01-02: qty 1000

## 2022-01-02 MED ORDER — ONDANSETRON 4 MG PO TBDP: 4.0000 mg | ORAL_TABLET | Freq: Three times a day (TID) | ORAL | 0 refills | Status: DC | PRN

## 2022-01-02 MED ORDER — SODIUM CHLORIDE 0.9 % IV BOLUS
1000.0000 mL | Freq: Once | INTRAVENOUS | Status: AC
  Administered 2022-01-02: 1000 mL via INTRAVENOUS

## 2022-01-02 MED ORDER — IOHEXOL 300 MG/ML  SOLN
100.0000 mL | Freq: Once | INTRAMUSCULAR | Status: AC | PRN
  Administered 2022-01-02: 100 mL via INTRAVENOUS

## 2022-01-02 NOTE — Consult Note (Signed)
Reason for Consult: Left-sided abdominal pain Referring Physician: Emergency room  Brandon Dominguez is an 36 y.o. male.  HPI: Patient is a 36 year old white male status post a laparoscopic cholecystectomy, Ladd's procedure, and appendectomy by Dr. Henreitta Leber on 12/26/2021 who presents back to the emergency room with a 24-hour history of worsening with lower quadrant abdominal pain.  He states he has a history of diverticulitis and is concerned that this has recurred.  He has had mild fevers but denies any nausea or vomiting.  His stools are more formed and he denies any blood in his stools or diarrhea.  Past Medical History:  Diagnosis Date   Allergy    Asthma    Diabetes mellitus without complication (HCC)    Diverticulitis    Diverticulosis    GERD (gastroesophageal reflux disease) 12/25/2021   Hepatic steatosis    Malrotation of intestine    Pancreatitis     Past Surgical History:  Procedure Laterality Date   CHOLECYSTECTOMY N/A 12/26/2021   Procedure: LAPAROSCOPIC CHOLECYSTECTOMY;  Surgeon: Lucretia Roers, MD;  Location: [facility];  Service: General;  Laterality: N/A;   cranial surgery     eye socket fracture   EYE SURGERY     Put plate in   OPEN RELEASE OF LADD BANDS PEDIATRIC N/A 12/26/2021   Procedure: LAPAROSCOPIC LADD'S PROCEDURE;  Surgeon: Lucretia Roers, MD;  Location: [facility];  Service: General;  Laterality: N/A;   UMBILICAL HERNIA REPAIR N/A 12/26/2021   Procedure: HERNIA REPAIR UMBILICAL ADULT;  Surgeon: Lucretia Roers, MD;  Location: [facility];  Service: General;  Laterality: N/A;    Family History  Problem Relation Age of Onset   Diverticulitis Mother    Colon polyps Mother    Diabetes Paternal Grandmother    Diabetes Paternal Grandfather    Heart disease Paternal Grandfather    Heart disease Paternal Uncle    Heart disease Paternal Aunt    Colon cancer Neg Hx    Kidney disease Neg Hx    Liver disease Neg Hx     Social History:  reports that he has never smoked. He  has never used smokeless tobacco. He reports current alcohol use. He reports that he does not use drugs.  Allergies: No Known Allergies  Medications: Prior to Admission: (Not in a hospital admission)   Results for orders placed or performed during the hospital encounter of 01/02/22 (from the past 48 hour(s))  CBC     Status: Abnormal   Collection Time: 01/02/22 12:18 PM  Result Value Ref Range   WBC 12.1 (H) 4.0 - 10.5 K/uL   RBC 4.99 4.22 - 5.81 MIL/uL   Hemoglobin 15.6 13.0 - 17.0 g/dL   HCT 26.7 12.4 - 58.0 %   MCV 92.8 80.0 - 100.0 fL   MCH 31.3 26.0 - 34.0 pg   MCHC 33.7 30.0 - 36.0 g/dL   RDW 99.8 33.8 - 25.0 %   Platelets 421 (H) 150 - 400 K/uL   nRBC 0.0 0.0 - 0.2 %    Comment: Performed at [facility], [address] [place], [place] [address]    No results found.  ROS:  Pertinent items are noted in HPI.  Blood pressure (!) 127/92, pulse (!) 102, temperature 98.1 F (36.7 C), temperature source Oral, resp. rate 20, height 5\' 11"  (1.803 m), weight 122.5 kg, SpO2 97 %. Physical Exam: Pleasant white male who appears fatigued, but in no acute distress Head is normocephalic, atraumatic Lungs clear to auscultation  with equal breath sounds bilaterally Heart examination reveals sinus tachycardia at 110. Abdomen is soft with well healed surgical incision sites.  He is somewhat tender to deep palpation lower in the left lower quadrant.  Resolving ecchymosis presumably from subcu heparin across the lower abdomen seems to be improving.  Assessment/Plan: Impression: Left lower quadrant abdominal pain of unknown etiology.  He is status post recent surgical intervention.  Has a history of diverticulitis.  Complete labs are pending. Plan: We will get CT scan of the abdomen to further assess.  Further management is pending those results.  [name] 01/02/2022, 12:50 PM

## 2022-01-02 NOTE — Telephone Encounter (Signed)
Received call from patient [phone]~ telephone.    Surgical Date: 12/26/2021 Procedure: Laparoscopic cholecystectomy, Laparoscopic Ladd's procedure (adhesions lysed, appendectomy, ensuring wide base of mesentery, colon on left, small bowel on right), primary umbilical hernia repair.  Patient reports increased LLQ abdominal pain. Reports that he feels that diverticulitis has returned.   Patient does report that nausea and vomiting has resolved. Denies fever/ chills.   Advised to return to ER for evaluation.

## 2022-01-02 NOTE — Discharge Instructions (Signed)
Your CT scan and blood work appear consistent with postoperative changes.  Return to the ER if you have worsening symptoms or fevers or any additional concerns.  Otherwise follow-up with your surgeon this week.

## 2022-01-02 NOTE — ED Notes (Signed)
Pt refused administration of morphine. Stated he does not want any narcotic pain medications, will take tylenol if needed. Morphine returned to pyxis.

## 2022-01-02 NOTE — ED Notes (Signed)
Pt refused IV placement anywhere but in his hand. IV insertion attempted with no success. Charge nurse made aware and asked to come assist. New IV supplies placed at bedside.

## 2022-01-02 NOTE — ED Triage Notes (Signed)
Pt to ER with c/o lower abdominal pain that started yesterday.  Pt states n/v yesterday, not today.  Pt has gallbladder removed and appendectomy and hernia repair last Friday.

## 2022-01-02 NOTE — ED Provider Notes (Signed)
[facility] EMERGENCY DEPARTMENT Provider Note   CSN: [id number] Arrival date & time: 01/02/22  1139     History  Chief Complaint  Patient presents with   Abdominal Pain    Brandon Dominguez is a 36 y.o. male.  Patient with history of congenital malrotation of the intestines, had abdominal surgery including appendix removal and gallbladder removal last week.  He had been doing well until recurrence of left-sided pain in the last 2 to 3 days.  He called the surgical team and was advised to come back to the ER.  Denies fevers or cough positive nausea.       Home Medications Prior to Admission medications   Medication Sig Start Date End Date Taking? Authorizing Provider  docusate sodium (COLACE) 100 MG capsule Take 1 capsule (100 mg total) by mouth 2 (two) times daily as needed for mild constipation. 12/30/21  Yes Lucretia Roers, MD  ondansetron (ZOFRAN-ODT) 4 MG disintegrating tablet Take 1 tablet (4 mg total) by mouth every 8 (eight) hours as needed for nausea or vomiting. 12/30/21  Yes Lucretia Roers, MD  oxyCODONE (OXY IR/ROXICODONE) 5 MG immediate release tablet Take 1 tablet (5 mg total) by mouth every 4 (four) hours as needed for breakthrough pain or severe pain. 12/30/21  Yes Lucretia Roers, MD      Allergies    Patient has no known allergies.    Review of Systems   Review of Systems  Constitutional:  Negative for fever.  HENT:  Negative for ear pain and sore throat.   Eyes:  Negative for pain.  Respiratory:  Negative for cough.   Cardiovascular:  Negative for chest pain.  Gastrointestinal:  Positive for abdominal pain.  Genitourinary:  Negative for flank pain.  Musculoskeletal:  Negative for back pain.  Skin:  Negative for color change and rash.  Neurological:  Negative for syncope.  All other systems reviewed and are negative.   Physical Exam Updated Vital Signs BP 128/83   Pulse 85   Temp (!) 97.3 F (36.3 C) (Oral)   Resp 19   Ht 5\' 11"  (1.803 m)    Wt 122.5 kg   SpO2 99%   BMI 37.66 kg/m  Physical Exam Constitutional:      Appearance: He is well-developed.  HENT:     Head: Normocephalic.     Nose: Nose normal.  Eyes:     Extraocular Movements: Extraocular movements intact.  Cardiovascular:     Rate and Rhythm: Normal rate.  Pulmonary:     Effort: Pulmonary effort is normal.  Abdominal:     Tenderness: There is abdominal tenderness in the left lower quadrant. There is guarding.  Skin:    Coloration: Skin is not jaundiced.  Neurological:     Mental Status: He is alert. Mental status is at baseline.     ED Results / Procedures / Treatments   Labs (all labs ordered are listed, but only abnormal results are displayed) Labs Reviewed  LIPASE, BLOOD - Abnormal; Notable for the following components:      Result Value   Lipase 64 (*)    All other components within normal limits  COMPREHENSIVE METABOLIC PANEL - Abnormal; Notable for the following components:   Glucose, Bld 119 (*)    BUN 22 (*)    Total Protein 8.2 (*)    ALT 59 (*)    All other components within normal limits  CBC - Abnormal; Notable for the following components:   WBC  12.1 (*)    Platelets 421 (*)    All other components within normal limits  URINALYSIS, ROUTINE W REFLEX MICROSCOPIC - Abnormal; Notable for the following components:   Color, Urine AMBER (*)    APPearance CLOUDY (*)    Ketones, ur 20 (*)    Protein, ur 30 (*)    All other components within normal limits  CULTURE, BLOOD (ROUTINE X 2)  CULTURE, BLOOD (ROUTINE X 2)  LACTIC ACID, PLASMA  LACTIC ACID, PLASMA    EKG None  Radiology No results found.  Procedures Procedures    Medications Ordered in ED Medications  morphine (PF) 4 MG/ML injection 4 mg (4 mg Intravenous Patient Refused/Not Given 01/02/22 1400)  sodium chloride 0.9 % bolus 1,000 mL (0 mLs Intravenous Stopped 01/02/22 1527)  iohexol (OMNIPAQUE) 9 MG/ML oral solution (  Contrast Given 01/02/22 1410)  ondansetron  (ZOFRAN) injection 4 mg (4 mg Intravenous Given 01/02/22 1359)  iohexol (OMNIPAQUE) 300 MG/ML solution 100 mL (100 mLs Intravenous Contrast Given 01/02/22 1553)    ED Course/ Medical Decision Making/ A&P                           Medical Decision Making Amount and/or Complexity of Data Reviewed Labs: ordered. Radiology: ordered.  Risk Prescription drug management.   Patient seen by Dr. Lovell Sheehan on rounds here in the ER today.  Recommending CT scan.  Labs sent white count 12 hemoglobin 15 chemistry unremarkable urinalysis negative.  Lactic acid 1.1 and normal.  CT of the pelvis ordered and pending final read.  Patient declining morphine at the time.          Final Clinical Impression(s) / ED Diagnoses Final diagnoses:  Left lower quadrant abdominal pain    Rx / DC Orders ED Discharge Orders     None         Cheryll Cockayne, MD 01/02/22 1620

## 2022-01-07 LAB — CULTURE, BLOOD (ROUTINE X 2)
Culture: NO GROWTH
Culture: NO GROWTH
Special Requests: ADEQUATE

## 2022-01-07 NOTE — Progress Notes (Signed)
Coding Query: Pathology back with acute cholecystitis superimposed on chronic cholecystitis and cholelithiasis, Cholesterolosis of the gallbladder which goes along with his gallstone pancreatitis, cholecystitis and this was expected and clinically significant diagnosis given the indication for surgery.   The appendectomy was performed as a part of a ladd's procedure and was done electively. The early acute appendicitis is unexpected but likely from overall inflammation and is a clinically significant diagnosis.  Algis Greenhouse, MD [facility] [address] [place] [place], [place] [address] [phone] (office)

## 2022-01-14 ENCOUNTER — Ambulatory Visit (INDEPENDENT_AMBULATORY_CARE_PROVIDER_SITE_OTHER): Payer: BC Managed Care – PPO | Admitting: General Surgery

## 2022-01-14 ENCOUNTER — Encounter: Payer: Self-pay | Admitting: General Surgery

## 2022-01-14 VITALS — BP 121/80 | HR 101 | Temp 98.6°F | Resp 12 | Ht 71.0 in | Wt 269.0 lb

## 2022-01-14 DIAGNOSIS — K8511 Biliary acute pancreatitis with uninfected necrosis: Secondary | ICD-10-CM

## 2022-01-14 DIAGNOSIS — Q433 Congenital malformations of intestinal fixation: Secondary | ICD-10-CM

## 2022-01-14 NOTE — Progress Notes (Unsigned)
[facility]  Patient went to ED post op with LLQ pain he felt was his diverticulitis despite Ed imaging showing no diverticulitis. He says that after that visit the pain continued and he had issues with diarrhea. He says that whenever he gets diarrhea and is dehydrated he gets a flare. He was able to manage this himself with diet and hydration.   He says things are getting better now. He is otherwise doing well and having some soreness at the port sites.   Pathology FINAL MICROSCOPIC DIAGNOSIS:   A. OMENTUM, HERNIA SAC, BIOPSY:  Portions of omentum with ischemic changes including fat necrosis and  marked vascular ectasia.  Hernia sac with attached portions of adipose tissue and marked vascular  ectasia.  Negative for inflammation, thrombi and neoplasm.   B. GALLBLADDER, CHOLECYSTECTOMY:  Acute cholecystitis superimposed on chronic cholecystitis and  cholelithiasis.  Cholesterolosis of the gallbladder.  Negative for neoplasm.   C. APPENDIX, APPENDECTOMY:  Early acute appendicitis.  Lymphoid hyperplasia of the appendix.  Negative for neoplasm.   BP 121/80   Pulse (!) 101   Temp 98.6 F (37 C) (Oral)   Resp 12   Ht 5\' 11"  (1.803 m)   Wt 269 lb (122 kg)   SpO2 95%   BMI 37.52 kg/m  Port sites and umbilical incision c/d/I with dermabond, no erythema or drainage, no hernia recurrence, RLQ are where heparin shots had been given no longer with erythema from injections   Patient s/p Laparoscopic cholecystectomy for gallstone pancreatitis, laparoscopic ladd's procedure including appendectomy for malrotation, and umbilical hernia repair primarily. Doing well overall.   No heavy lifting > 10 lbs, excessive bending, pushing, pulling, or squatting for 6-8 weeks after surgery.   Diet as tolerated.   Future Appointments  Date Time Provider Department Center  02/04/2022  3:45 PM 02/06/2022, MD [facility] None   Lucretia Roers, MD [facility] [address] [place], [place] [phone] (office)

## 2022-01-14 NOTE — Patient Instructions (Signed)
No heavy lifting > 10 lbs, excessive bending, pushing, pulling, or squatting for 6-8 weeks after surgery.   

## 2022-01-22 ENCOUNTER — Encounter: Payer: BC Managed Care – PPO | Admitting: General Surgery

## 2022-02-04 ENCOUNTER — Ambulatory Visit (INDEPENDENT_AMBULATORY_CARE_PROVIDER_SITE_OTHER): Payer: BC Managed Care – PPO | Admitting: General Surgery

## 2022-02-04 DIAGNOSIS — Q433 Congenital malformations of intestinal fixation: Secondary | ICD-10-CM

## 2022-02-04 NOTE — Progress Notes (Signed)
[facility]  This is not a billable encounter.   He is feeling good. Had a diverticulitis flare a few weeks after surgery. Glue on the inferior edge can pull off now as he is over a month out.   Healing fine. Gets some urgency with restroom at times but not consistent.  Hernia healing.   PRN follow up. No heavy lifting > 10 lbs, excessive bending, pushing, pulling, or squatting for 6-8 weeks after surgery. Recommend going slow after 02/23/22.   Algis Greenhouse, MD [facility] [address] [place], [place] [phone] (office)

## 2022-08-04 ENCOUNTER — Telehealth: Payer: BC Managed Care – PPO | Admitting: Physician Assistant

## 2022-08-04 DIAGNOSIS — Z20828 Contact with and (suspected) exposure to other viral communicable diseases: Secondary | ICD-10-CM

## 2022-08-04 DIAGNOSIS — R6889 Other general symptoms and signs: Secondary | ICD-10-CM

## 2022-08-04 MED ORDER — OSELTAMIVIR PHOSPHATE 75 MG PO CAPS: 75.0000 mg | ORAL_CAPSULE | Freq: Two times a day (BID) | ORAL | 0 refills | Status: DC

## 2022-08-04 MED ORDER — BENZONATATE 100 MG PO CAPS: 100.0000 mg | ORAL_CAPSULE | Freq: Three times a day (TID) | ORAL | 0 refills | Status: DC | PRN

## 2022-08-04 NOTE — Patient Instructions (Addendum)
Brandon Dominguez, thank you for joining [name], PA-C for today's virtual visit.  While this provider is not your primary care provider (PCP), if your PCP is located in our provider database this encounter information will be shared with them immediately following your visit.   [facility] account gives you access to today's visit and all your visits, tests, and labs performed at [facility] " click here if you don't have a [facility] account or go to [email or web address]  Consent: (Patient) Brandon Dominguez provided verbal consent for this virtual visit at the beginning of the encounter.  Current Medications:  Current Outpatient Medications:    docusate sodium (COLACE) 100 MG capsule, Take 1 capsule (100 mg total) by mouth 2 (two) times daily as needed for mild constipation., Disp: 30 capsule, Rfl: 0   ondansetron (ZOFRAN-ODT) 4 MG disintegrating tablet, Take 1 tablet (4 mg total) by mouth every 8 (eight) hours as needed for nausea or vomiting. (Patient not taking: Reported on 01/14/2022), Disp: 20 tablet, Rfl: 0   oxyCODONE (OXY IR/ROXICODONE) 5 MG immediate release tablet, Take 1 tablet (5 mg total) by mouth every 4 (four) hours as needed for breakthrough pain or severe pain. (Patient not taking: Reported on 01/14/2022), Disp: 15 tablet, Rfl: 0   Medications ordered in this encounter:  No orders of the defined types were placed in this encounter.    *If you need refills on other medications prior to your next appointment, please contact your pharmacy*  Follow-Up: Call back or seek an in-person evaluation if the symptoms worsen or if the condition fails to improve as anticipated.  [place] [phone]  Other Instructions Please keep well-hydrated and try to get plenty of rest. If you have a humidifier, place it in the bedroom and run it at night. Start a saline nasal rinse for nasal congestion. You can consider use of a nasal  steroid spray like Flonase or Nasacort OTC. You can alternate between Tylenol and Ibuprofen if needed for fever, body aches, headache and/or throat pain. Salt water-gargles and chloraseptic spray can be very beneficial for sore throat. Mucinex-DM for congestion or cough. Please take all prescribed medications as directed.  Remain out of work until [employer] for 24 hours without a fever-reducing medication, and you are feeling better.  You should mask until symptoms are resolved.  If anything worsens despite treatment, you need to be evaluated in-person. Please do not delay care.  Influenza, Adult Influenza is also called "the flu." It is an infection in the lungs, nose, and throat (respiratory tract). It spreads easily from person to person (is contagious). The flu causes symptoms that are like a cold, along with high fever and body aches. What are the causes? This condition is caused by the influenza virus. You can get the virus by: Breathing in droplets that are in the air after a person infected with the flu coughed or sneezed. Touching something that has the virus on it and then touching your mouth, nose, or eyes. What increases the risk? Certain things may make you more likely to get the flu. These include: Not washing your hands often. Having close contact with many people during cold and flu season. Touching your mouth, eyes, or nose without first washing your hands. Not getting a flu shot every year. You may have a higher risk for the flu, and serious problems, such as a lung infection (pneumonia), if you: Are older than 65. Are pregnant. Have a  weakened disease-fighting system (immune system) because of a disease or because you are taking certain medicines. Have a long-term (chronic) condition, such as: Heart, kidney, or lung disease. Diabetes. Asthma. Have a liver disorder. Are very overweight (morbidly obese). Have anemia. What are the signs or symptoms? Symptoms usually  begin suddenly and last 4-14 days. They may include: Fever and chills. Headaches, body aches, or muscle aches. Sore throat. Cough. Runny or stuffy (congested) nose. Feeling discomfort in your chest. Not wanting to eat as much as normal. Feeling weak or tired. Feeling dizzy. Feeling sick to your stomach or throwing up. How is this treated? If the flu is found early, you can be treated with antiviral medicine. This can help to reduce how bad the illness is and how long it lasts. This may be given by mouth or through an IV tube. Taking care of yourself at home can help your symptoms get better. Your doctor may want you to: Take over-the-counter medicines. Drink plenty of fluids. The flu often goes away on its own. If you have very bad symptoms or other problems, you may be treated in a hospital. Follow these instructions at home:     Activity Rest as needed. Get plenty of sleep. Stay home from work or school as told by your doctor. Do not leave home until you do not have a fever for 24 hours without taking medicine. Leave home only to go to your doctor. Eating and drinking Take an ORS (oral rehydration solution). This is a drink that is sold at pharmacies and stores. Drink enough fluid to keep your pee pale yellow. Drink clear fluids in small amounts as you are able. Clear fluids include: Water. Ice chips. Fruit juice mixed with water. Low-calorie sports drinks. Eat bland foods that are easy to digest. Eat small amounts as you are able. These foods include: Bananas. Applesauce. Rice. Lean meats. Toast. Crackers. Do not eat or drink: Fluids that have a lot of sugar or caffeine. Alcohol. Spicy or fatty foods. General instructions Take over-the-counter and prescription medicines only as told by your doctor. Use a cool mist humidifier to add moisture to the air in your home. This can make it easier for you to breathe. When using a cool mist humidifier, clean it daily. Empty  water and replace with clean water. Cover your mouth and nose when you cough or sneeze. Wash your hands with soap and water often and for at least 20 seconds. This is also important after you cough or sneeze. If you cannot use soap and water, use alcohol-based hand sanitizer. Keep all follow-up visits. How is this prevented?  Get a flu shot every year. You may get the flu shot in late summer, fall, or winter. Ask your doctor when you should get your flu shot. Avoid contact with people who are sick during fall and winter. This is cold and flu season. Contact a doctor if: You get new symptoms. You have: Chest pain. Watery poop (diarrhea). A fever. Your cough gets worse. You start to have more mucus. You feel sick to your stomach. You throw up. Get help right away if you: Have shortness of breath. Have trouble breathing. Have skin or nails that turn a bluish color. Have very bad pain or stiffness in your neck. Get a sudden headache. Get sudden pain in your face or ear. Cannot eat or drink without throwing up. These symptoms may represent a serious problem that is an emergency. Get medical help right away. Call your  local emergency services (911 in the [place] Do not wait to see if the symptoms will go away. Do not drive yourself to the hospital. Summary Influenza is also called "the flu." It is an infection in the lungs, nose, and throat. It spreads easily from person to person. Take over-the-counter and prescription medicines only as told by your doctor. Getting a flu shot every year is the best way to not get the flu. This information is not intended to replace advice given to you by your health care provider. Make sure you discuss any questions you have with your health care provider. Document Revised: 02/23/2020 Document Reviewed: 02/23/2020 Elsevier Patient Education  [place].    If you have been instructed to have an in-person evaluation today at a local Urgent  Care facility, please use the link below. It will take you to a list of all of our available [facility], including address, phone number and hours of operation. Please do not delay care.  [facility]  If you or a family member do not have a primary care provider, use the link below to schedule a visit and establish care. When you choose a [facility] primary care physician or advanced practice provider, you gain a long-term partner in health. Find a Primary Care Provider  Learn more about [facility] in-office and virtual care options: [facility]

## 2022-08-04 NOTE — Progress Notes (Signed)
Virtual Visit Consent   Brandon Dominguez, you are scheduled for a virtual visit with a [facility] provider today. Just as with appointments in the office, your consent must be obtained to participate. Your consent will be active for this visit and any virtual visit you may have with one of our providers in the next 365 days. If you have a MyChart account, a copy of this consent can be sent to you electronically.  As this is a virtual visit, video technology does not allow for your provider to perform a traditional examination. This may limit your provider's ability to fully assess your condition. If your provider identifies any concerns that need to be evaluated in person or the need to arrange testing (such as labs, EKG, etc.), we will make arrangements to do so. Although advances in technology are sophisticated, we cannot ensure that it will always work on either your end or our end. If the connection with a video visit is poor, the visit may have to be switched to a telephone visit. With either a video or telephone visit, we are not always able to ensure that we have a secure connection.  By engaging in this virtual visit, you consent to the provision of healthcare and authorize for your insurance to be billed (if applicable) for the services provided during this visit. Depending on your insurance coverage, you may receive a charge related to this service.  I need to obtain your verbal consent now. Are you willing to proceed with your visit today? Francis Doenges has provided verbal consent on 08/04/2022 for a virtual visit (video or telephone). Brandon Dominguez, [place]  Date: 08/04/2022 11:15 AM  Virtual Visit via Video Note   I, Brandon Dominguez, connected with  Ashland Wiseman  ([id number], 09-Nov-1985) on 08/04/22 at 11:00 AM EST by a video-enabled telemedicine application and verified that I am speaking with the correct person using two identifiers.  Location: Patient: Virtual Visit Location Patient:  Home Provider: Virtual Visit Location Provider: Home Office   I discussed the limitations of evaluation and management by telemedicine and the availability of in person appointments. The patient expressed understanding and agreed to proceed.    History of Present Illness: Brandon Dominguez is a 37 y.o. who identifies as a male who was assigned male at birth, and is being seen today for concern of influenza. Started last night with raspy cough that is persistent. As of this morning with low-grade fever (100.3), fatigue, chills, body aches. Notes his dad, niece and girlfriend all recently diagnosed and treated for influenza A. Has taken OTC Dayquil starting this morning. Denies any known exposure to COVID.  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   Diverticulosis of colon without hemorrhage 05/03/2017   Allergic rhinitis 05/31/2014   Asthma, mild intermittent 05/31/2014   Malrotation, congenital 08/03/2013    Allergies: No Known Allergies Medications:  Current Outpatient Medications:    benzonatate (TESSALON) 100 MG capsule, Take 1 capsule (100 mg total) by mouth 3 (three) times daily as needed for cough., Disp: 30 capsule, Rfl: 0   oseltamivir (TAMIFLU) 75 MG capsule, Take 1 capsule (75 mg total) by  mouth 2 (two) times daily., Disp: 10 capsule, Rfl: 0  Observations/Objective: Patient is well-developed, well-nourished in no acute distress.  Resting comfortably at home.  Head is normocephalic, atraumatic.  No labored breathing. Speech is clear and coherent with logical content.  Patient is alert and oriented at baseline.   Assessment and  Plan: 1. Exposure to the flu - oseltamivir (TAMIFLU) 75 MG capsule; Take 1 capsule (75 mg total) by mouth 2 (two) times daily.  Dispense: 10 capsule; Refill: 0  2. Flu-like symptoms - benzonatate (TESSALON) 100 MG capsule; Take 1 capsule (100 mg total) by mouth 3 (three) times daily as needed for cough.  Dispense: 30 capsule; Refill: 0 - oseltamivir (TAMIFLU) 75 MG capsule; Take 1 capsule (75 mg total) by mouth 2 (two) times daily.  Dispense: 10 capsule; Refill: 0  Classic influenza symptoms. Known exposure. Supportive measures, OTC medications and Vitamin recommendations reviewed. Will start Tamiflu per orders. Tessalon per orders. Quarantine reviewed with patient.    Follow Up Instructions: I discussed the assessment and treatment plan with the patient. The patient was provided an opportunity to ask questions and all were answered. The patient agreed with the plan and demonstrated an understanding of the instructions.  A copy of instructions were sent to the patient via MyChart unless otherwise noted below.   The patient was advised to call back or seek an in-person evaluation if the symptoms worsen or if the condition fails to improve as anticipated.  Time:  I spent 10 minutes with the patient via telehealth technology discussing the above problems/concerns.    Brandon Rio, PA-C

## 2024-04-04 ENCOUNTER — Encounter: Payer: Self-pay | Admitting: Family Medicine

## 2024-04-04 ENCOUNTER — Ambulatory Visit (INDEPENDENT_AMBULATORY_CARE_PROVIDER_SITE_OTHER): Payer: Self-pay | Admitting: Family Medicine

## 2024-04-04 ENCOUNTER — Ambulatory Visit: Payer: Self-pay | Admitting: Family Medicine

## 2024-04-04 VITALS — BP 132/80 | HR 92 | Temp 97.8°F | Ht 71.0 in | Wt 269.0 lb

## 2024-04-04 DIAGNOSIS — Z9889 Other specified postprocedural states: Secondary | ICD-10-CM

## 2024-04-04 DIAGNOSIS — Z8719 Personal history of other diseases of the digestive system: Secondary | ICD-10-CM

## 2024-04-04 DIAGNOSIS — Z9049 Acquired absence of other specified parts of digestive tract: Secondary | ICD-10-CM

## 2024-04-04 DIAGNOSIS — E1169 Type 2 diabetes mellitus with other specified complication: Secondary | ICD-10-CM

## 2024-04-04 LAB — LIPID PANEL

## 2024-04-04 LAB — BAYER DCA HB A1C WAIVED: HB A1C (BAYER DCA - WAIVED): 6.7 % — ABNORMAL HIGH (ref 4.8–5.6)

## 2024-04-04 NOTE — Patient Instructions (Signed)

## 2024-04-04 NOTE — Progress Notes (Signed)
 Subjective:  Patient ID: Brandon Dominguez, male    DOB: February 04, 1986, 38 y.o.   MRN: [id number]  Patient Care Team: Severa Rock HERO, FNP as PCP - General (Family Medicine)   Chief Complaint:  New Patient (Initial Visit) (Previous [facility] patient ) and Establish Care   HPI: Brandon Dominguez is a 38 y.o. male presenting on 04/04/2024 for New Patient (Initial Visit) (Previous [facility] patient ) and Establish Care   Brandon Dominguez is a 38 year old male with type 2 diabetes who presents for diabetes management.  Diagnosed with type 2 diabetes in 2018, he initially managed his condition with a high dose of metformin, which he discontinued after a month due to concerns about long-term effects. Through strict diet and exercise, he successfully reduced his A1c from approximately 15.3 to 8.4. He occasionally checks his blood glucose levels, with fasting levels typically between 100 and 120 mg/dL. He experiences occasional increased urination every three to four weeks. Yes, there are frequent increased hunger or thirst.  He has a history of elevated liver enzymes following surgeries for gallbladder, appendix, and hernia repair. The last labs in 2023 showed a downward trend but still elevated levels. He experiences persistent tenderness under the rib, described as a burning sensation, and occasional pulling sensations in the hernia area. He has not had any follow-up since the surgery.  He has diverticulosis with occasional flares of diverticulitis, which he associates with inadequate water intake, stress, and consumption of nuts. Popcorn used to trigger symptoms but no longer does.  He has not had an eye exam in almost two years, which is concerning given his diabetes. He recalls a past episode in his early twenties of dizziness and blurry vision after consuming several sodas, leading to a temporary diabetic diet regimen.  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           Relevant past medical, surgical, family, and social history reviewed and updated as indicated.  Allergies and medications reviewed and updated. Data reviewed: Chart in Epic.   Past Medical History:  Diagnosis Date   Allergy    Asthma    Diabetes mellitus without complication (HCC)    Diverticulitis    Diverticulosis    GERD (gastroesophageal reflux disease) 12/25/2021   Hepatic steatosis    Malrotation of intestine    Pancreatitis     Past Surgical History:  Procedure Laterality Date   APPENDECTOMY     CHOLECYSTECTOMY N/A 12/26/2021   Procedure: LAPAROSCOPIC CHOLECYSTECTOMY;  Surgeon: Kallie Manuelita BROCKS, MD;  Location: [facility];  Service: General;  Laterality: N/A;   cranial surgery     eye socket fracture   EYE SURGERY     Put plate in   OPEN RELEASE OF LADD BANDS PEDIATRIC N/A 12/26/2021   Procedure: LAPAROSCOPIC LADD'S PROCEDURE;  Surgeon: Kallie Manuelita BROCKS, MD;  Location: [facility];  Service: General;  Laterality: N/A;   UMBILICAL HERNIA REPAIR N/A 12/26/2021   Procedure: HERNIA REPAIR UMBILICAL ADULT;  Surgeon: Kallie Manuelita BROCKS, MD;  Location: [facility];  Service: General;  Laterality: N/A;    Social History   Socioeconomic History   Marital status: Single    Spouse name: Not on file   Number of children: Not on file   Years of education: Not on file   Highest education level: Not on file  Occupational History   Not on file  Tobacco Use   Smoking status: Never  Smokeless tobacco: Never  Vaping Use   Vaping status: Never Used  Substance and Sexual Activity   Alcohol use: Yes    Comment: occassional   Drug use: No   Sexual activity: Not Currently  Other Topics Concern   Not on file  Social History Narrative   Not on file   Social Drivers of Health   Financial Resource Strain: Patient Declined (04/04/2024)   Overall Financial Resource Strain (CARDIA)    Difficulty of Paying Living Expenses: Patient declined  Food Insecurity: Patient Declined  (04/04/2024)   Hunger Vital Sign    Worried About Running Out of Food in the Last Year: Patient declined    Ran Out of Food in the Last Year: Patient declined  Transportation Needs: Patient Declined (04/04/2024)   PRAPARE - Administrator, Civil Service (Medical): Patient declined    Lack of Transportation (Non-Medical): Patient declined  Physical Activity: Insufficiently Active (04/04/2024)   Exercise Vital Sign    Days of Exercise per Week: 1 day    Minutes of Exercise per Session: 30 min  Stress: Stress Concern Present (04/04/2024)   [employer] of Occupational Health - Occupational Stress Questionnaire    Feeling of Stress: Rather much  Social Connections: Unknown (04/04/2024)   Social Connection and Isolation Panel    Frequency of Communication with Friends and Family: More than three times a week    Frequency of Social Gatherings with Friends and Family: More than three times a week    Attends Religious Services: Patient declined    Database administrator or Organizations: No    Attends Banker Meetings: Not on file    Marital Status: Patient declined  Intimate Partner Violence: Not on file    Outpatient Encounter Medications as of 04/04/2024  Medication Sig   [DISCONTINUED] benzonatate  (TESSALON ) 100 MG capsule Take 1 capsule (100 mg total) by mouth 3 (three) times daily as needed for cough.   [DISCONTINUED] oseltamivir  (TAMIFLU ) 75 MG capsule Take 1 capsule (75 mg total) by mouth 2 (two) times daily.   No facility-administered encounter medications on file as of 04/04/2024.    No Known Allergies  Pertinent ROS per HPI, otherwise unremarkable      Objective:  BP 132/80   Pulse 92   Temp 97.8 F (36.6 C)   Ht 5' 11 (1.803 m)   Wt 269 lb (122 kg)   SpO2 96%   BMI 37.52 kg/m    Wt Readings from Last 3 Encounters:  04/04/24 269 lb (122 kg)  01/14/22 269 lb (122 kg)  01/02/22 270 lb (122.5 kg)    Physical Exam Vitals and nursing note  reviewed.  Constitutional:      Appearance: Normal appearance. He is morbidly obese.  HENT:     Head: Normocephalic and atraumatic.     Nose: Nose normal.     Mouth/Throat:     Mouth: Mucous membranes are moist.  Eyes:     Conjunctiva/sclera: Conjunctivae normal.     Pupils: Pupils are equal, round, and reactive to light.  Cardiovascular:     Rate and Rhythm: Normal rate and regular rhythm.     Heart sounds: Normal heart sounds.  Pulmonary:     Effort: Pulmonary effort is normal.     Breath sounds: Normal breath sounds.  Musculoskeletal:     Cervical back: Neck supple.     Right lower leg: No edema.     Left lower leg: No edema.  Skin:    General: Skin is warm and dry.     Capillary Refill: Capillary refill takes less than 2 seconds.  Neurological:     General: No focal deficit present.     Mental Status: He is alert and oriented to person, place, and time.  Psychiatric:        Mood and Affect: Mood normal.        Behavior: Behavior normal. Behavior is cooperative.        Thought Content: Thought content normal.        Judgment: Judgment normal.    Results for orders placed or performed during the hospital encounter of 01/02/22  Lipase, blood   Collection Time: 01/02/22 12:18 PM  Result Value Ref Range   Lipase 64 (H) 11 - 51 U/L  Comprehensive metabolic panel   Collection Time: 01/02/22 12:18 PM  Result Value Ref Range   Sodium 136 135 - 145 mmol/L   Potassium 3.5 3.5 - 5.1 mmol/L   Chloride 102 98 - 111 mmol/L   CO2 24 22 - 32 mmol/L   Glucose, Bld 119 (H) 70 - 99 mg/dL   BUN 22 (H) 6 - 20 mg/dL   Creatinine, Ser 9.05 0.61 - 1.24 mg/dL   Calcium 9.1 8.9 - 89.6 mg/dL   Total Protein 8.2 (H) 6.5 - 8.1 g/dL   Albumin 3.7 3.5 - 5.0 g/dL   AST 22 15 - 41 U/L   ALT 59 (H) 0 - 44 U/L   Alkaline Phosphatase 81 38 - 126 U/L   Total Bilirubin 1.1 0.3 - 1.2 mg/dL   GFR, Estimated >39 >39 mL/min   Anion gap 10 5 - 15  CBC   Collection Time: 01/02/22 12:18 PM  Result  Value Ref Range   WBC 12.1 (H) 4.0 - 10.5 K/uL   RBC 4.99 4.22 - 5.81 MIL/uL   Hemoglobin 15.6 13.0 - 17.0 g/dL   HCT 53.6 60.9 - 47.9 %   MCV 92.8 80.0 - 100.0 fL   MCH 31.3 26.0 - 34.0 pg   MCHC 33.7 30.0 - 36.0 g/dL   RDW 87.1 88.4 - 84.4 %   Platelets 421 (H) 150 - 400 K/uL   nRBC 0.0 0.0 - 0.2 %  Urinalysis, Routine w reflex microscopic   Collection Time: 01/02/22 12:24 PM  Result Value Ref Range   Color, Urine AMBER (A) YELLOW   APPearance CLOUDY (A) CLEAR   Specific Gravity, Urine 1.025 1.005 - 1.030   pH 5.0 5.0 - 8.0   Glucose, UA NEGATIVE NEGATIVE mg/dL   Hgb urine dipstick NEGATIVE NEGATIVE   Bilirubin Urine NEGATIVE NEGATIVE   Ketones, ur 20 (A) NEGATIVE mg/dL   Protein, ur 30 (A) NEGATIVE mg/dL   Nitrite NEGATIVE NEGATIVE   Leukocytes,Ua NEGATIVE NEGATIVE   RBC / HPF 0-5 0 - 5 RBC/hpf   WBC, UA 0-5 0 - 5 WBC/hpf   Bacteria, UA NONE SEEN NONE SEEN   Mucus PRESENT   Culture, blood (routine x 2)   Collection Time: 01/02/22  1:02 PM   Specimen: BLOOD RIGHT HAND  Result Value Ref Range   Specimen Description      BLOOD RIGHT HAND BOTTLES DRAWN AEROBIC AND ANAEROBIC   Special Requests Blood Culture adequate volume    Culture      NO GROWTH 5 DAYS Performed at [facility], [address], [place], [place] [address]    Report Status 01/07/2022 FINAL   Culture, blood (routine x 2)  Collection Time: 01/02/22  1:29 PM   Specimen: BLOOD LEFT HAND  Result Value Ref Range   Specimen Description      BLOOD LEFT HAND BOTTLES DRAWN AEROBIC AND ANAEROBIC   Special Requests      Blood Culture results may not be optimal due to an inadequate volume of blood received in culture bottles   Culture      NO GROWTH 5 DAYS Performed at [facility], [address], [place], [place] [address]    Report Status 01/07/2022 FINAL   Lactic acid, plasma   Collection Time: 01/02/22  1:29 PM  Result Value Ref Range   Lactic Acid, Venous 0.9 0.5 - 1.9 mmol/L  Lactic acid, plasma    Collection Time: 01/02/22  3:43 PM  Result Value Ref Range   Lactic Acid, Venous 1.1 0.5 - 1.9 mmol/L       Pertinent labs & imaging results that were available during my care of the patient were reviewed by me and considered in my medical decision making.  Assessment & Plan:  Brandon Dominguez was seen today for new patient (initial visit) and establish care.  Diagnoses and all orders for this visit:  Type 2 diabetes mellitus with other specified complication, without long-term current use of insulin  (HCC) -     Bayer DCA Hb A1c Waived -     Microalbumin / creatinine urine ratio -     CMP14+EGFR -     CBC with Differential/Platelet -     Lipid panel -     Thyroid  Panel With TSH  Morbid obesity (HCC) -     Bayer DCA Hb A1c Waived -     Microalbumin / creatinine urine ratio -     CMP14+EGFR -     CBC with Differential/Platelet -     Lipid panel -     Thyroid  Panel With TSH  S/P hernia repair -     CMP14+EGFR -     CBC with Differential/Platelet  S/P cholecystectomy -     CMP14+EGFR -     CBC with Differential/Platelet  S/P appendectomy -     CMP14+EGFR -     CBC with Differential/Platelet     Type 2 diabetes mellitus Diagnosed in 2018 with initial A1c of 15.3-15.6, reduced to 8.4 with diet and exercise. Currently not on medication. Fasting blood sugars range between 100-120 mg/dL. Trace protein and glucose in urine. Blood pressure is borderline at 132/80 mmHg. No recent A1c, CMP, or eye exams for diabetic retinopathy. - Order A1c test and comprehensive metabolic panel (CMP) - Discuss lab costs with him - Recommend annual eye exam for diabetic retinopathy - Consider ACE inhibitor or ARB for renal protection - Consider statin for cardiovascular protection  Borderline hypertension Blood pressure recorded at 132/80 mmHg. Potential benefit of ACE inhibitors or ARBs for blood pressure control and renal protection in the context of diabetes. - Consider ACE inhibitor or ARB for blood  pressure control and renal protection  Elevated liver enzymes Last labs in 2023 showed elevated liver enzymes post-surgery. No recent follow-up or labs to assess current liver function. - Order comprehensive metabolic panel (CMP) to assess liver function  Chronic right upper abdominal pain post-surgery Persistent tenderness and burning sensation under the right rib since surgery over two years ago. Possible nerve involvement. No resolution of symptoms despite time elapsed since surgery.  Diverticulosis with intermittent diverticulitis flares Intermittent flares triggered by dehydration, stress, and certain foods like nuts and popcorn. Reports not  drinking enough water, which may exacerbate symptoms.          Continue all other maintenance medications.  Follow up plan: Return in about 3 months (around 07/04/2024), or if symptoms worsen or fail to improve, for DM.   Continue healthy lifestyle choices, including diet (rich in fruits, vegetables, and lean proteins, and low in salt and simple carbohydrates) and exercise (at least 30 minutes of moderate physical activity daily).  Educational handout given for DM  The above assessment and management plan was discussed with the patient. The patient verbalized understanding of and has agreed to the management plan. Patient is aware to call the clinic if they develop any new symptoms or if symptoms persist or worsen. Patient is aware when to return to the clinic for a follow-up visit. Patient educated on when it is appropriate to go to the emergency department.   Rosaline Bruns, FNP-C [facility] [phone]

## 2024-04-05 LAB — CBC WITH DIFFERENTIAL/PLATELET
Basophils Absolute: 0.1 x10E3/uL (ref 0.0–0.2)
Basos: 1 %
EOS (ABSOLUTE): 0.1 x10E3/uL (ref 0.0–0.4)
Eos: 2 %
Hematocrit: 43.7 % (ref 37.5–51.0)
Hemoglobin: 15 g/dL (ref 13.0–17.7)
Immature Grans (Abs): 0 x10E3/uL (ref 0.0–0.1)
Immature Granulocytes: 0 %
Lymphocytes Absolute: 2 x10E3/uL (ref 0.7–3.1)
Lymphs: 26 %
MCH: 32.3 pg (ref 26.6–33.0)
MCHC: 34.3 g/dL (ref 31.5–35.7)
MCV: 94 fL (ref 79–97)
Monocytes Absolute: 1 x10E3/uL — ABNORMAL HIGH (ref 0.1–0.9)
Monocytes: 13 %
Neutrophils Absolute: 4.4 x10E3/uL (ref 1.4–7.0)
Neutrophils: 57 %
Platelets: 253 x10E3/uL (ref 150–450)
RBC: 4.65 x10E6/uL (ref 4.14–5.80)
RDW: 12.6 % (ref 11.6–15.4)
WBC: 7.5 x10E3/uL (ref 3.4–10.8)

## 2024-04-05 LAB — CMP14+EGFR
ALT: 23 IU/L (ref 0–44)
AST: 18 IU/L (ref 0–40)
Albumin: 4.3 g/dL (ref 4.1–5.1)
Alkaline Phosphatase: 70 IU/L (ref 47–123)
BUN/Creatinine Ratio: 14 (ref 9–20)
BUN: 12 mg/dL (ref 6–24)
Bilirubin Total: 0.5 mg/dL (ref 0.0–1.2)
CO2: 20 mmol/L (ref 20–29)
Calcium: 9.6 mg/dL (ref 8.7–10.2)
Chloride: 104 mmol/L (ref 96–106)
Creatinine, Ser: 0.87 mg/dL (ref 0.76–1.27)
Globulin, Total: 2.8 g/dL (ref 1.5–4.5)
Glucose: 142 mg/dL — AB (ref 70–99)
Potassium: 4 mmol/L (ref 3.5–5.2)
Sodium: 141 mmol/L (ref 134–144)
Total Protein: 7.1 g/dL (ref 6.0–8.5)
eGFR: 112 mL/min/1.73 (ref >59)

## 2024-04-05 LAB — MICROALBUMIN / CREATININE URINE RATIO
Creatinine, Urine: 218 mg/dL
Microalb/Creat Ratio: 5 mg/g{creat} (ref 0–29)
Microalbumin, Urine: 11.2 ug/mL

## 2024-04-05 LAB — THYROID PANEL WITH TSH
Free Thyroxine Index: 2 (ref 1.2–4.9)
T3 Uptake Ratio: 28 % (ref 24–39)
T4, Total: 7.3 ug/dL (ref 4.5–12.0)
TSH: 2.22 u[IU]/mL (ref 0.450–4.500)

## 2024-04-05 LAB — LIPID PANEL
Cholesterol, Total: 166 mg/dL (ref 100–199)
HDL: 51 mg/dL (ref >39)
LDL CALC COMMENT:: 3.3 ratio (ref 0.0–5.0)
LDL Chol Calc (NIH): 99 mg/dL (ref 0–99)
Triglycerides: 88 mg/dL (ref 0–149)
VLDL Cholesterol Cal: 16 mg/dL (ref 5–40)

## 2024-04-17 ENCOUNTER — Ambulatory Visit: Payer: Self-pay | Admitting: Family Medicine

## 2024-04-17 ENCOUNTER — Encounter: Payer: Self-pay | Admitting: Family Medicine

## 2024-04-17 VITALS — BP 150/85 | HR 105 | Temp 98.0°F | Ht 71.0 in | Wt 271.0 lb

## 2024-04-17 DIAGNOSIS — M7021 Olecranon bursitis, right elbow: Secondary | ICD-10-CM

## 2024-04-17 DIAGNOSIS — L03116 Cellulitis of left lower limb: Secondary | ICD-10-CM

## 2024-04-17 MED ORDER — SULFAMETHOXAZOLE-TRIMETHOPRIM 800-160 MG PO TABS: 1.0000 | ORAL_TABLET | Freq: Two times a day (BID) | ORAL | 0 refills | Status: AC

## 2024-04-17 NOTE — Progress Notes (Signed)
 BP (!) 150/85   Pulse (!) 105   Temp 98 F (36.7 C)   Ht 5' 11 (1.803 m)   Wt 271 lb (122.9 kg)   SpO2 100%   BMI 37.80 kg/m    Subjective:   Patient ID: Brandon Dominguez, male    DOB: Dec 09, 1985, 38 y.o.   MRN: [id number]  HPI: Brandon Dominguez is a 38 y.o. male presenting on 04/17/2024 for Rash (LLE. Normally has brown rash but has turned red. It itches, burns and hurts. Had similar rash 1w ago on right elbow/forearm)   Discussed the use of AI scribe software for clinical note transcription with the patient, who gave verbal consent to proceed.  History of Present Illness   Brandon Dominguez is a 38 year old male who presents with skin infections and bursitis.  Cutaneous eruptions and infections - Brown rash present, attributed to prior cellulitis and frequent flea bites due to living in a flea-infested house - New red rash developed at approximately 4 AM today, described as painful and warm to the touch - Concern for two skin infections within the past week  Localized arm swelling and bursitis - History of a previous bump on the arm, initially painful and suspected to be related to Enbrel use - Bump spread down the arm before receding; improved with Epsom salt soaks and ibuprofen  - Protrusion on the arm resembling a wart, which ruptured after trauma, resulting in a fluid sac and subsequent infection - Persistent swelling and warmth of the bursa in the arm, with associated stiffness and pain, worsened by repeated trauma at work - Ongoing use of ibuprofen  and Epsom salt soaks for symptom management - Concern for infection spread if the bursa ruptures  Occupational exacerbation of symptoms - Recently started night shifts at [employer], involving significant physical activity and prolonged standing - Frequent accidental trauma to the affected arm at work, exacerbating pain and swelling          Relevant past medical, surgical, family and social history reviewed and updated as indicated.  Interim medical history since our last visit reviewed. Allergies and medications reviewed and updated.  Review of Systems  Constitutional:  Negative for chills and fever.  Eyes:  Negative for visual disturbance.  Respiratory:  Negative for shortness of breath and wheezing.   Cardiovascular:  Negative for chest pain and leg swelling.  Musculoskeletal:  Negative for arthralgias, back pain and gait problem.  Skin:  Positive for color change and rash. Negative for wound.  All other systems reviewed and are negative.   Per HPI unless specifically indicated above   Allergies as of 04/17/2024   No Known Allergies      Medication List        Accurate as of April 17, 2024 10:27 AM. If you have any questions, ask your nurse or doctor.          sulfamethoxazole-trimethoprim 800-160 MG tablet Commonly known as: BACTRIM DS Take 1 tablet by mouth 2 (two) times daily. Started by: Brandon Dominguez         Objective:   BP (!) 150/85   Pulse (!) 105   Temp 98 F (36.7 C)   Ht 5' 11 (1.803 m)   Wt 271 lb (122.9 kg)   SpO2 100%   BMI 37.80 kg/m   Wt Readings from Last 3 Encounters:  04/17/24 271 lb (122.9 kg)  04/04/24 269 lb (122 kg)  01/14/22 269 lb (122 kg)    Physical Exam  Physical Exam   MUSCULOSKELETAL: Swollen, inflamed, and warm bursa in the elbow. SKIN: Cellulitis on the left leg.  Red fine papular rash with surrounding erythema posterior left lower extremity, warm to palpation  Right olecranon bursitis without erythema, possibly slight warmth        Assessment & Plan:   Problem List Items Addressed This Visit   None Visit Diagnoses       Cellulitis of left lower extremity    -  Primary   Relevant Medications   sulfamethoxazole-trimethoprim (BACTRIM DS) 800-160 MG tablet     Olecranon bursitis of right elbow       Relevant Medications   sulfamethoxazole-trimethoprim (BACTRIM DS) 800-160 MG tablet          Cellulitis of left lower  limb Acute cellulitis likely due to skin infection, possibly from scratching or minor trauma. - Prescribed Bactrim (sulfamethoxazole and trimethoprim) twice daily for ten days. Advised to take with food. - Monitor for reduction in heat and redness within 48 to 72 hours after starting antibiotics.  Olecranon bursitis of left elbow, possible infection Olecranon bursitis with possible infection. Swollen and warm bursa, history of trauma and drainage. Improvement with Epsom salt soaks and ibuprofen , but inflammation persists. Possible need for surgical intervention if infection is within the bursa. - Continue ibuprofen  for inflammation. - Apply ice to elbow three times daily for fifteen minutes. - Use ACE bandage or neoprene sleeve for cushioning. - Monitor for reduction in heat within 48 to 72 hours after starting antibiotics. Contact provider if heat persists after one week. - Consider referral to orthopedics if infection does not resolve or heat persists, indicating possible need for surgical intervention.          Follow up plan: Return if symptoms worsen or fail to improve.  Counseling provided for all of the vaccine components No orders of the defined types were placed in this encounter.   Brandon Levins, MD [facility] 04/17/2024, 10:27 AM

## 2024-04-25 ENCOUNTER — Ambulatory Visit: Admitting: Nurse Practitioner

## 2024-07-06 ENCOUNTER — Ambulatory Visit: Payer: Self-pay | Admitting: Family Medicine
